# Patient Record
Sex: Female | Born: 1985 | Race: Black or African American | Hispanic: No | Marital: Married | Smoking: Former smoker
Health system: Southern US, Community
[De-identification: ages and names within clinical notes are randomized; demographics above are authoritative.]

## PROBLEM LIST (undated history)

## (undated) ENCOUNTER — Emergency Department (HOSPITAL_COMMUNITY): Admission: EM | Payer: Self-pay | Source: Home / Self Care

## (undated) DIAGNOSIS — F909 Attention-deficit hyperactivity disorder, unspecified type: Secondary | ICD-10-CM

## (undated) DIAGNOSIS — B999 Unspecified infectious disease: Secondary | ICD-10-CM

## (undated) DIAGNOSIS — F32A Depression, unspecified: Secondary | ICD-10-CM

## (undated) DIAGNOSIS — D219 Benign neoplasm of connective and other soft tissue, unspecified: Secondary | ICD-10-CM

## (undated) DIAGNOSIS — K5080 Crohn's disease of both small and large intestine without complications: Secondary | ICD-10-CM

## (undated) DIAGNOSIS — L309 Dermatitis, unspecified: Secondary | ICD-10-CM

## (undated) DIAGNOSIS — T8859XA Other complications of anesthesia, initial encounter: Secondary | ICD-10-CM

## (undated) DIAGNOSIS — D649 Anemia, unspecified: Secondary | ICD-10-CM

## (undated) DIAGNOSIS — T4145XA Adverse effect of unspecified anesthetic, initial encounter: Secondary | ICD-10-CM

## (undated) DIAGNOSIS — H269 Unspecified cataract: Secondary | ICD-10-CM

## (undated) DIAGNOSIS — K429 Umbilical hernia without obstruction or gangrene: Secondary | ICD-10-CM

## (undated) DIAGNOSIS — N83209 Unspecified ovarian cyst, unspecified side: Secondary | ICD-10-CM

## (undated) DIAGNOSIS — F419 Anxiety disorder, unspecified: Secondary | ICD-10-CM

## (undated) HISTORY — DX: Attention-deficit hyperactivity disorder, unspecified type: F90.9

## (undated) HISTORY — DX: Umbilical hernia without obstruction or gangrene: K42.9

## (undated) HISTORY — PX: ESOPHAGOGASTRODUODENOSCOPY: SHX1529

## (undated) HISTORY — DX: Depression, unspecified: F32.A

## (undated) HISTORY — DX: Unspecified cataract: H26.9

## (undated) HISTORY — PX: COLONOSCOPY: SHX174

## (undated) HISTORY — PX: OTHER SURGICAL HISTORY: SHX169

## (undated) HISTORY — DX: Crohn's disease of both small and large intestine without complications: K50.80

## (undated) HISTORY — DX: Benign neoplasm of connective and other soft tissue, unspecified: D21.9

---
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
## 2007-04-14 ENCOUNTER — Emergency Department (HOSPITAL_COMMUNITY): Admission: EM | Admit: 2007-04-14 | Discharge: 2007-04-14 | Payer: Self-pay | Admitting: Emergency Medicine

## 2007-08-26 ENCOUNTER — Emergency Department (HOSPITAL_COMMUNITY): Admission: EM | Admit: 2007-08-26 | Discharge: 2007-08-27 | Payer: Self-pay | Admitting: Emergency Medicine

## 2011-01-24 LAB — RAPID STREP SCREEN (MED CTR MEBANE ONLY): Streptococcus, Group A Screen (Direct): NEGATIVE

## 2011-01-24 LAB — STREP A DNA PROBE: Group A Strep Probe: NEGATIVE

## 2016-06-19 DIAGNOSIS — K5080 Crohn's disease of both small and large intestine without complications: Secondary | ICD-10-CM | POA: Diagnosis present

## 2016-06-19 HISTORY — DX: Crohn's disease of both small and large intestine without complications: K50.80

## 2017-06-25 ENCOUNTER — Encounter (HOSPITAL_COMMUNITY): Payer: Self-pay

## 2017-06-25 ENCOUNTER — Other Ambulatory Visit: Payer: Self-pay

## 2017-06-25 DIAGNOSIS — Z6837 Body mass index (BMI) 37.0-37.9, adult: Secondary | ICD-10-CM | POA: Diagnosis not present

## 2017-06-25 DIAGNOSIS — E440 Moderate protein-calorie malnutrition: Secondary | ICD-10-CM | POA: Insufficient documentation

## 2017-06-25 DIAGNOSIS — D473 Essential (hemorrhagic) thrombocythemia: Secondary | ICD-10-CM | POA: Diagnosis not present

## 2017-06-25 DIAGNOSIS — Z87891 Personal history of nicotine dependence: Secondary | ICD-10-CM | POA: Diagnosis not present

## 2017-06-25 DIAGNOSIS — F419 Anxiety disorder, unspecified: Secondary | ICD-10-CM | POA: Diagnosis not present

## 2017-06-25 DIAGNOSIS — K50811 Crohn's disease of both small and large intestine with rectal bleeding: Secondary | ICD-10-CM | POA: Diagnosis present

## 2017-06-25 DIAGNOSIS — K5010 Crohn's disease of large intestine without complications: Secondary | ICD-10-CM | POA: Diagnosis not present

## 2017-06-25 DIAGNOSIS — D649 Anemia, unspecified: Secondary | ICD-10-CM | POA: Insufficient documentation

## 2017-06-25 DIAGNOSIS — Z79899 Other long term (current) drug therapy: Secondary | ICD-10-CM | POA: Insufficient documentation

## 2017-06-25 LAB — CBC
HEMATOCRIT: 36 % (ref 36.0–46.0)
Hemoglobin: 11.8 g/dL — ABNORMAL LOW (ref 12.0–15.0)
MCH: 27.3 pg (ref 26.0–34.0)
MCHC: 32.8 g/dL (ref 30.0–36.0)
MCV: 83.1 fL (ref 78.0–100.0)
PLATELETS: 582 10*3/uL — AB (ref 150–400)
RBC: 4.33 MIL/uL (ref 3.87–5.11)
RDW: 16.4 % — ABNORMAL HIGH (ref 11.5–15.5)
WBC: 8 10*3/uL (ref 4.0–10.5)

## 2017-06-25 LAB — COMPREHENSIVE METABOLIC PANEL
ALT: 10 U/L — AB (ref 14–54)
AST: 15 U/L (ref 15–41)
Albumin: 2.7 g/dL — ABNORMAL LOW (ref 3.5–5.0)
Alkaline Phosphatase: 53 U/L (ref 38–126)
Anion gap: 6 (ref 5–15)
BUN: 9 mg/dL (ref 6–20)
CHLORIDE: 107 mmol/L (ref 101–111)
CO2: 24 mmol/L (ref 22–32)
CREATININE: 0.87 mg/dL (ref 0.44–1.00)
Calcium: 8.8 mg/dL — ABNORMAL LOW (ref 8.9–10.3)
Glucose, Bld: 104 mg/dL — ABNORMAL HIGH (ref 65–99)
Potassium: 4 mmol/L (ref 3.5–5.1)
SODIUM: 137 mmol/L (ref 135–145)
Total Bilirubin: <0.1 mg/dL — ABNORMAL LOW (ref 0.3–1.2)
Total Protein: 7.2 g/dL (ref 6.5–8.1)

## 2017-06-25 NOTE — ED Triage Notes (Addendum)
Pt arrives today c/o blood in stools starting yesterday. Pt recently dx with chrones disease. Pt reports 7x watery stools today, with blood dripping from rectum. Pt reports upper/mid epigastric abd pain consistent with previous chrones flare ups.  Denies chest pain, but does report some shortness of breath today.

## 2017-06-26 ENCOUNTER — Other Ambulatory Visit: Payer: Self-pay

## 2017-06-26 ENCOUNTER — Observation Stay (HOSPITAL_COMMUNITY)
Admission: EM | Admit: 2017-06-26 | Discharge: 2017-06-28 | Disposition: A | Discharge status: Home / Self Care | Payer: Managed Care, Other (non HMO) | Attending: Internal Medicine | Admitting: Internal Medicine

## 2017-06-26 ENCOUNTER — Encounter (HOSPITAL_COMMUNITY): Payer: Self-pay

## 2017-06-26 ENCOUNTER — Emergency Department (HOSPITAL_COMMUNITY): Payer: Managed Care, Other (non HMO)

## 2017-06-26 DIAGNOSIS — E440 Moderate protein-calorie malnutrition: Secondary | ICD-10-CM

## 2017-06-26 DIAGNOSIS — D696 Thrombocytopenia, unspecified: Secondary | ICD-10-CM

## 2017-06-26 DIAGNOSIS — K50811 Crohn's disease of both small and large intestine with rectal bleeding: Secondary | ICD-10-CM | POA: Diagnosis not present

## 2017-06-26 DIAGNOSIS — K5080 Crohn's disease of both small and large intestine without complications: Secondary | ICD-10-CM | POA: Diagnosis present

## 2017-06-26 DIAGNOSIS — R197 Diarrhea, unspecified: Secondary | ICD-10-CM | POA: Diagnosis not present

## 2017-06-26 DIAGNOSIS — A0472 Enterocolitis due to Clostridium difficile, not specified as recurrent: Secondary | ICD-10-CM | POA: Diagnosis not present

## 2017-06-26 DIAGNOSIS — R1084 Generalized abdominal pain: Secondary | ICD-10-CM | POA: Diagnosis not present

## 2017-06-26 DIAGNOSIS — D649 Anemia, unspecified: Secondary | ICD-10-CM | POA: Diagnosis present

## 2017-06-26 DIAGNOSIS — K5090 Crohn's disease, unspecified, without complications: Secondary | ICD-10-CM | POA: Diagnosis not present

## 2017-06-26 LAB — C DIFFICILE QUICK SCREEN W PCR REFLEX
C DIFFICILE (CDIFF) TOXIN: NEGATIVE
C DIFFICLE (CDIFF) ANTIGEN: POSITIVE — AB

## 2017-06-26 LAB — GASTROINTESTINAL PANEL BY PCR, STOOL (REPLACES STOOL CULTURE)

## 2017-06-26 LAB — URINALYSIS, ROUTINE W REFLEX MICROSCOPIC
Bilirubin Urine: NEGATIVE
GLUCOSE, UA: NEGATIVE mg/dL
Hgb urine dipstick: NEGATIVE
Ketones, ur: 5 mg/dL — AB
LEUKOCYTES UA: NEGATIVE
Nitrite: NEGATIVE
Protein, ur: NEGATIVE mg/dL
Specific Gravity, Urine: 1.025 (ref 1.005–1.030)
pH: 5 (ref 5.0–8.0)

## 2017-06-26 LAB — CBC
HCT: 36.4 % (ref 36.0–46.0)
HCT: 35.5 % — ABNORMAL LOW (ref 36.0–46.0)
Hemoglobin: 11.9 g/dL — ABNORMAL LOW (ref 12.0–15.0)
Hemoglobin: 11.5 g/dL — ABNORMAL LOW (ref 12.0–15.0)
MCH: 27.4 pg (ref 26.0–34.0)
MCH: 27 pg (ref 26.0–34.0)
MCHC: 32.7 g/dL (ref 30.0–36.0)
MCHC: 32.4 g/dL (ref 30.0–36.0)
MCV: 83.7 fL (ref 78.0–100.0)
MCV: 83.3 fL (ref 78.0–100.0)
PLATELETS: 569 10*3/uL — AB (ref 150–400)
Platelets: 496 10*3/uL — ABNORMAL HIGH (ref 150–400)
RBC: 4.35 MIL/uL (ref 3.87–5.11)
RBC: 4.26 MIL/uL (ref 3.87–5.11)
RDW: 16.5 % — AB (ref 11.5–15.5)
RDW: 16.5 % — ABNORMAL HIGH (ref 11.5–15.5)
WBC: 7.8 10*3/uL (ref 4.0–10.5)
WBC: 7.8 10*3/uL (ref 4.0–10.5)

## 2017-06-26 LAB — TYPE AND SCREEN
ABO/RH(D): B NEG
Antibody Screen: NEGATIVE

## 2017-06-26 LAB — PREGNANCY, URINE: PREG TEST UR: NEGATIVE

## 2017-06-26 LAB — C-REACTIVE PROTEIN: CRP: 11.7 mg/dL — ABNORMAL HIGH (ref <1.0)

## 2017-06-26 LAB — HIV ANTIBODY (ROUTINE TESTING W REFLEX): HIV SCREEN 4TH GENERATION: NONREACTIVE

## 2017-06-26 LAB — CLOSTRIDIUM DIFFICILE BY PCR, REFLEXED: CDIFFPCR: POSITIVE — AB

## 2017-06-26 MED ORDER — FENTANYL CITRATE (PF) 100 MCG/2ML IJ SOLN
25.0000 ug | INTRAMUSCULAR | Status: DC | PRN
  Administered 2017-06-26 – 2017-06-28 (×4): 25 ug via INTRAVENOUS
  Filled 2017-06-26 (×4): qty 2

## 2017-06-26 MED ORDER — LIDOCAINE HCL 2 % EX GEL
1.0000 "application " | CUTANEOUS | Status: DC | PRN
  Administered 2017-06-26: 1 via TOPICAL
  Filled 2017-06-26 (×2): qty 5

## 2017-06-26 MED ORDER — IOPAMIDOL (ISOVUE-300) INJECTION 61%
30.0000 mL | Freq: Once | INTRAVENOUS | Status: AC | PRN
  Administered 2017-06-26: 30 mL via ORAL

## 2017-06-26 MED ORDER — IOPAMIDOL (ISOVUE-300) INJECTION 61%
100.0000 mL | Freq: Once | INTRAVENOUS | Status: AC | PRN
  Administered 2017-06-26: 80 mL via INTRAVENOUS

## 2017-06-26 MED ORDER — VANCOMYCIN 50 MG/ML ORAL SOLUTION
125.0000 mg | Freq: Four times a day (QID) | ORAL | Status: DC
  Administered 2017-06-26 – 2017-06-28 (×6): 125 mg via ORAL
  Filled 2017-06-26 (×7): qty 2.5

## 2017-06-26 MED ORDER — BOOST / RESOURCE BREEZE PO LIQD CUSTOM
1.0000 | Freq: Two times a day (BID) | ORAL | Status: DC
  Administered 2017-06-26 – 2017-06-27 (×2): 1 via ORAL

## 2017-06-26 MED ORDER — SODIUM CHLORIDE 0.9 % IJ SOLN
INTRAMUSCULAR | Status: AC
  Filled 2017-06-26: qty 50

## 2017-06-26 MED ORDER — FENTANYL CITRATE (PF) 100 MCG/2ML IJ SOLN
50.0000 ug | Freq: Once | INTRAMUSCULAR | Status: AC
  Administered 2017-06-26: 50 ug via INTRAVENOUS
  Filled 2017-06-26: qty 2

## 2017-06-26 MED ORDER — ONDANSETRON HCL 4 MG/2ML IJ SOLN
4.0000 mg | Freq: Once | INTRAMUSCULAR | Status: AC
  Administered 2017-06-26: 4 mg via INTRAVENOUS
  Filled 2017-06-26: qty 2

## 2017-06-26 MED ORDER — FERROUS SULFATE 325 (65 FE) MG PO TABS
325.0000 mg | ORAL_TABLET | Freq: Every day | ORAL | Status: DC
  Administered 2017-06-26 – 2017-06-28 (×3): 325 mg via ORAL
  Filled 2017-06-26 (×3): qty 1

## 2017-06-26 MED ORDER — IOPAMIDOL (ISOVUE-300) INJECTION 61%
INTRAVENOUS | Status: AC
  Administered 2017-06-26: 30 mL via ORAL
  Filled 2017-06-26: qty 100

## 2017-06-26 MED ORDER — SODIUM CHLORIDE 0.9 % IV SOLN
INTRAVENOUS | Status: AC
  Administered 2017-06-26 (×2): via INTRAVENOUS

## 2017-06-26 MED ORDER — VITAMIN B-12 1000 MCG PO TABS
1000.0000 ug | ORAL_TABLET | Freq: Every day | ORAL | Status: DC
  Administered 2017-06-26 – 2017-06-28 (×3): 1000 ug via ORAL
  Filled 2017-06-26 (×3): qty 1

## 2017-06-26 MED ORDER — IOPAMIDOL (ISOVUE-300) INJECTION 61%
INTRAVENOUS | Status: AC
  Filled 2017-06-26: qty 30

## 2017-06-26 MED ORDER — ADULT MULTIVITAMIN W/MINERALS CH
1.0000 | ORAL_TABLET | Freq: Every day | ORAL | Status: DC
  Administered 2017-06-26 – 2017-06-28 (×3): 1 via ORAL
  Filled 2017-06-26 (×3): qty 1

## 2017-06-26 MED ORDER — SODIUM CHLORIDE 0.9 % IV BOLUS (SEPSIS)
500.0000 mL | Freq: Once | INTRAVENOUS | Status: AC
  Administered 2017-06-26: 500 mL via INTRAVENOUS

## 2017-06-26 MED ORDER — ACETAMINOPHEN 650 MG RE SUPP: 650.0000 mg | Freq: Four times a day (QID) | RECTAL | Status: DC | PRN

## 2017-06-26 MED ORDER — ACETAMINOPHEN 325 MG PO TABS: 650.0000 mg | ORAL_TABLET | Freq: Four times a day (QID) | ORAL | Status: DC | PRN

## 2017-06-26 MED ORDER — METHYLPREDNISOLONE SODIUM SUCC 40 MG IJ SOLR
15.0000 mg | Freq: Three times a day (TID) | INTRAMUSCULAR | Status: DC
  Administered 2017-06-26 – 2017-06-27 (×5): 15.2 mg via INTRAVENOUS
  Filled 2017-06-26 (×5): qty 1

## 2017-06-26 MED ORDER — ONDANSETRON HCL 4 MG/2ML IJ SOLN
4.0000 mg | Freq: Four times a day (QID) | INTRAMUSCULAR | Status: DC | PRN
  Administered 2017-06-26 (×2): 4 mg via INTRAVENOUS
  Filled 2017-06-26 (×2): qty 2

## 2017-06-26 MED ORDER — ONDANSETRON HCL 4 MG PO TABS: 4.0000 mg | ORAL_TABLET | Freq: Four times a day (QID) | ORAL | Status: DC | PRN

## 2017-06-26 NOTE — Progress Notes (Signed)
Patient C. Diff results are in. Jeannette Corpus, NP made aware.

## 2017-06-26 NOTE — ED Provider Notes (Signed)
[facility] DEPT Provider Note: Georgena Spurling, MD, FACEP  CSN: [id number] MRN: [id number] ARRIVAL: 06/25/17 at 2125 ROOM: [place]  Rectal Bleeding   HISTORY OF PRESENT ILLNESS  06/26/17 12:11 AM Whitney Lowe is a 32 y.o. female who was diagnosed with Crohn's disease about a year ago.  She was previously on prednisone but was switched to Humira about 4 months ago.  Since being on the Humira her symptoms have worsened.  She does not believe the Humira is effective.  She has worsened over the last week with epigastric pain, which she rates as a 7 out of 10 and worse with movement or palpation, weight loss, watery diarrhea, and bright red blood per rectum.  The blood is actually been dripping from her rectum at times.  There is also associated rectal pain.  She denies nausea or vomiting.  She has generalized weakness.  She is followed in [place] and has not seen her physician there in over a month.    History reviewed. No pertinent past medical history.  History reviewed. No pertinent surgical history.  History reviewed. No pertinent family history.  Social History   Tobacco Use  . Smoking status: Former Research scientist (life sciences)  . Smokeless tobacco: Never Used  Substance Use Topics  . Alcohol use: No    Frequency: Never  . Drug use: No    Prior to Admission medications   Medication Sig Start Date End Date Taking? Authorizing Provider  Adalimumab (HUMIRA) 40 MG/0.4ML PSKT Inject 40 mg into the skin every 14 (fourteen) days.   Yes [provider]  cholecalciferol (VITAMIN D) 1000 units tablet Take 1,000 Units by mouth daily.   Yes [provider]  Ferrous Sulfate (IRON) 28 MG TABS Take 1 tablet by mouth daily.    Yes [provider]  vitamin B-12 (CYANOCOBALAMIN) 1000 MCG tablet Take 1,000 mcg by mouth daily.   Yes [provider]    Allergies Patient has no known allergies.   REVIEW OF SYSTEMS  Negative except as noted here or in  the History of Present Illness.   PHYSICAL EXAMINATION  Initial Vital Signs Blood pressure 109/74, pulse (!) 142, temperature 98.7 F (37.1 C), temperature source Oral, resp. rate 16, height 4' 10"  (1.473 m), weight 40.4 kg (89 lb), last menstrual period 06/09/2017, SpO2 99 %.  Examination General: Well-developed, thin female in no acute distress; appearance consistent with age of record HENT: normocephalic; atraumatic Eyes: pupils equal, round and reactive to light; extraocular muscles intact Neck: supple Heart: regular rate and rhythm Lungs: clear to auscultation bilaterally Abdomen: soft; nondistended; epigastric tenderness; no masses or hepatosplenomegaly; bowel sounds present Rectal: normal appearance, pain on separation of buttocks; refused digital examination Extremities: No deformity; full range of motion; pulses normal Neurologic: Awake, alert and oriented; motor function intact in all extremities and symmetric; no facial droop Skin: Warm and dry Psychiatric: Normal mood and affect   RESULTS  Summary of this visit's results, reviewed by myself:  EKG Interpretation:  Date & Time: 06/25/2017 10:04 PM  Rate: 108  Rhythm: sinus tachycardia  QRS Axis: normal  Intervals: normal  ST/T Wave abnormalities: normal  Conduction Disutrbances:none  Narrative Interpretation:   Old EKG Reviewed: none available   Laboratory Studies: Results for orders placed or performed during the hospital encounter of 06/26/17 (from the past 24 hour(s))  Comprehensive metabolic panel     Status: Abnormal   Collection Time: 06/25/17 10:24 PM  Result Value Ref Range  Sodium 137 135 - 145 mmol/L   Potassium 4.0 3.5 - 5.1 mmol/L   Chloride 107 101 - 111 mmol/L   CO2 24 22 - 32 mmol/L   Glucose, Bld 104 (H) 65 - 99 mg/dL   BUN 9 6 - 20 mg/dL   Creatinine, Ser 0.87 0.44 - 1.00 mg/dL   Calcium 8.8 (L) 8.9 - 10.3 mg/dL   Total Protein 7.2 6.5 - 8.1 g/dL   Albumin 2.7 (L) 3.5 - 5.0 g/dL   AST  15 15 - 41 U/L   ALT 10 (L) 14 - 54 U/L   Alkaline Phosphatase 53 38 - 126 U/L   Total Bilirubin <0.1 (L) 0.3 - 1.2 mg/dL   GFR calc non Af Amer >60 >60 mL/min   GFR calc Af Amer >60 >60 mL/min   Anion gap 6 5 - 15  CBC     Status: Abnormal   Collection Time: 06/25/17 10:24 PM  Result Value Ref Range   WBC 8.0 4.0 - 10.5 K/uL   RBC 4.33 3.87 - 5.11 MIL/uL   Hemoglobin 11.8 (L) 12.0 - 15.0 g/dL   HCT 36.0 36.0 - 46.0 %   MCV 83.1 78.0 - 100.0 fL   MCH 27.3 26.0 - 34.0 pg   MCHC 32.8 30.0 - 36.0 g/dL   RDW 16.4 (H) 11.5 - 15.5 %   Platelets 582 (H) 150 - 400 K/uL  Type and screen [place]     Status: None   Collection Time: 06/25/17 10:24 PM  Result Value Ref Range   ABO/RH(D) B NEG    Antibody Screen NEG    Sample Expiration      06/28/2017 Performed at [facility], [place] [address] [place], [place] [address]   Pregnancy, urine     Status: None   Collection Time: 06/26/17 12:54 AM  Result Value Ref Range   Preg Test, Ur NEGATIVE NEGATIVE  Urinalysis, Routine w reflex microscopic     Status: Abnormal   Collection Time: 06/26/17 12:54 AM  Result Value Ref Range   Color, Urine YELLOW YELLOW   APPearance CLEAR CLEAR   Specific Gravity, Urine 1.025 1.005 - 1.030   pH 5.0 5.0 - 8.0   Glucose, UA NEGATIVE NEGATIVE mg/dL   Hgb urine dipstick NEGATIVE NEGATIVE   Bilirubin Urine NEGATIVE NEGATIVE   Ketones, ur 5 (A) NEGATIVE mg/dL   Protein, ur NEGATIVE NEGATIVE mg/dL   Nitrite NEGATIVE NEGATIVE   Leukocytes, UA NEGATIVE NEGATIVE   Imaging Studies: Ct Abdomen Pelvis W Contrast  Result Date: 06/26/2017 CLINICAL DATA:  Bloody stool EXAM: CT ABDOMEN AND PELVIS WITH CONTRAST TECHNIQUE: Multidetector CT imaging of the abdomen and pelvis was performed using the standard protocol following bolus administration of intravenous contrast. CONTRAST:  85m ISOVUE-300 IOPAMIDOL (ISOVUE-300) INJECTION 61% COMPARISON:  None. FINDINGS: Lower chest: No  acute abnormality. Hepatobiliary: Probable focal fat infiltration near the falciform ligament. No calcified gallstones or biliary dilatation Pancreas: Unremarkable. No pancreatic ductal dilatation or surrounding inflammatory changes. Spleen: Normal in size without focal abnormality. Adrenals/Urinary Tract: Adrenal glands are unremarkable. Kidneys are normal, without renal calculi, focal lesion, or hydronephrosis. Bladder is unremarkable. Stomach/Bowel: Stomach is nonenlarged. No dilated small bowel. Wall thickening and mucosal enhancement involving long segment of distal/terminal ileum. Diffuse wall thickening of the colon with mucosal enhancement. Vascular/Lymphatic: Nonaneurysmal aorta. No significantly enlarged lymph nodes Reproductive: Uterus contains multiple enhancing masses, for example right fundal mass measures 3 cm, right posterior myometrial exophytic mass  measures 3 cm. No adnexal mass. Other: Negative for free air or free fluid. Musculoskeletal: No acute or significant osseous findings. IMPRESSION: 1. Long segment wall thickening and mucosal enhancement involving the distal/terminal ileum with diffuse wall thickening and mucosal enhancement of the colon, consistent with ileitis and colitis, in keeping with the history of inflammatory bowel disease. No obstruction identified. 2. Fibroid uterus Electronically Signed   By: Donavan Foil M.D.   On: 06/26/2017 03:43    ED COURSE  Nursing notes and initial vitals signs, including pulse oximetry, reviewed.  Vitals:   06/26/17 0130 06/26/17 0200 06/26/17 0258 06/26/17 0400  BP: 101/67 101/71 101/70   Pulse: (!) 110 (!) 105 (!) 108 (!) 102  Resp: 18  18 (!) 21  Temp:      TempSrc:      SpO2: 100% 98% 99% 100%  Weight:      Height:       Dr. Hal Hope to admit to Hospitalist Service for Crohn's exacerbation.  PROCEDURES    ED DIAGNOSES     ICD-10-CM   1. Crohn's disease of both small and large intestine with rectal bleeding ([place]) K50.811         Christle Nolting, Jenny Reichmann, MD 06/26/17 [phone]

## 2017-06-26 NOTE — Consult Note (Signed)
Referring Provider: [facility]  Primary Care Physician:  Patient, No Pcp Per Primary Gastroenterologist:  In [place],  Reason for Consultation:   Crohn's , hematochezia.    ASSESSMENT AND PLAN:    32 yo female with Crohn's disease diagnosed March 2018.  Crohn's colitis by colonoscopy with biopsies but CT scan this admission c/w Crohn's ileocolitis.  Started Humira several months but never able to get off prednisone without recurrent symptoms. Totally off steriods since Jan and having 4-5 loose BMs a day, postprandial epigastric pain, weight loss and now with recent increase in rectal bleeding. -CRP -Stool path panel negative, C-diff pending.  -Adalimumab antibodies and drug level ordered -Solumedrol 15 mg Q hours.      HPI: Whitney Lowe is a 32 y.o. female with Crohn's colitis disease diagnosed a year ago in [place] , [place] by Dr. Modesto Charon. Her mother has Crohn's disease as well. Patient's presenting symptoms were diarrhea, abdominal pain and erythematous nodosum. Endoscopic workup showed Crohn's involvement of whole colon. Biopsies from rectal to cecum c/w with chronic inflammation/ chronic active colitis and focal non-necrotizing granulomas. Duodenal bx negative. Initially treated with prednisone while awaiting insurance approval of Humira. On prednisone her pain resolved and BM normalized. She got down to about 20 mg a day but became dependant on it as Humira a lone didn't work well enough. In January her GI doc stopped the prednisone. Since then she has averaged 5-6 loose stools a day, sometimes with blood and she has continued to have postprandial epigastric pain relieved with defecation. Over the last several days patient has noticed more blood in her stools and she is losing excessive weight. She has been on Humira 40 mg SQ Q 2 weeks. She was supposed to get stool samples and labs for humira antibodies but didn't make it back to [place]. Patient lives here now, she would like  for [place] to assume her GI care.       Past Medical History:  Diagnosis Date  . Crohn's colitis ([place])     History reviewed. No pertinent surgical history.  Prior to Admission medications   Medication Sig Start Date End Date Taking? Authorizing Provider  Adalimumab (HUMIRA) 40 MG/0.4ML PSKT Inject 40 mg into the skin every 14 (fourteen) days.   Yes [provider]  cholecalciferol (VITAMIN D) 1000 units tablet Take 1,000 Units by mouth daily.   Yes [provider]  Ferrous Sulfate (IRON) 28 MG TABS Take 1 tablet by mouth daily.    Yes [provider]  vitamin B-12 (CYANOCOBALAMIN) 1000 MCG tablet Take 1,000 mcg by mouth daily.   Yes [provider]    Current Facility-Administered Medications  Medication Dose Route Frequency Provider Last Rate Last Dose  . 0.9 %  sodium chloride infusion   Intravenous Continuous Rise Patience, MD 75 mL/hr at 06/26/17 [phone]    . acetaminophen (TYLENOL) tablet 650 mg  650 mg Oral Q6H PRN Rise Patience, MD       Or  . acetaminophen (TYLENOL) suppository 650 mg  650 mg Rectal Q6H PRN Rise Patience, MD      . feeding supplement (BOOST / RESOURCE BREEZE) liquid 1 Container  1 Container Oral BID BM Florencia Reasons, MD      . fentaNYL (SUBLIMAZE) injection 25 mcg  25 mcg Intravenous Q2H PRN Rise Patience, MD   25 mcg at 06/26/17 1201  . ferrous sulfate tablet 325 mg  325 mg Oral Daily Rise Patience,  MD   325 mg at 06/26/17 0900  . lidocaine (XYLOCAINE) 2 % jelly 1 application  1 application Topical PRN Molpus, John, MD   1 application at 79/89/21 0050  . multivitamin with minerals tablet 1 tablet  1 tablet Oral Daily Florencia Reasons, MD      . ondansetron [facility]) tablet 4 mg  4 mg Oral Q6H PRN Rise Patience, MD       Or  . ondansetron [facility]) injection 4 mg  4 mg Intravenous Q6H PRN Rise Patience, MD   4 mg at 06/26/17 1201  . vitamin B-12 (CYANOCOBALAMIN) tablet 1,000 mcg  1,000 mcg Oral  Daily Rise Patience, MD   1,000 mcg at 06/26/17 0900    Allergies as of 06/25/2017  . (Not on File)    Family History  Problem Relation Age of Onset  . Crohn's disease Mother     Social History   Socioeconomic History  . Marital status: Single    Spouse name: Not on file  . Number of children: Not on file  . Years of education: Not on file  . Highest education level: Not on file  Social Needs  . Financial resource strain: Not on file  . Food insecurity - worry: Not on file  . Food insecurity - inability: Not on file  . Transportation needs - medical: Not on file  . Transportation needs - non-medical: Not on file  Occupational History  . Not on file  Tobacco Use  . Smoking status: Former Research scientist (life sciences)  . Smokeless tobacco: Never Used  Substance and Sexual Activity  . Alcohol use: No    Frequency: Never  . Drug use: No  . Sexual activity: Not on file  Other Topics Concern  . Not on file  Social History Narrative  . Not on file    Review of Systems: All systems reviewed and negative except where noted in HPI.  Physical Exam: Vital signs in last 24 hours: Temp:  [98.1 F (36.7 C)-98.7 F (37.1 C)] 98.1 F (36.7 C) (03/08 1323) Pulse Rate:  [102-142] 124 (03/08 1323) Resp:  [12-21] 18 (03/08 1323) BP: (101-119)/(65-81) 102/65 (03/08 1323) SpO2:  [95 %-100 %] 100 % (03/08 1323) Weight:  [89 lb (40.4 kg)-92 lb 9.5 oz (42 kg)] 92 lb 9.5 oz (42 kg) (03/08 0629) Last BM Date: 06/26/17 General:   Alert, thin black female in NAD Psych:  Pleasant, cooperative. Normal mood and affect. Eyes:  Pupils equal, sclera clear, no icterus.   Conjunctiva pink. Ears:  Normal auditory acuity. Nose:  No deformity, discharge,  or lesions. Neck:  Supple; no masses Lungs:  Clear throughout to auscultation.   No wheezes, crackles, or rhonchi.  Heart:  Regular rate and rhythm; no murmurs, no edema Abdomen:  Soft, non-distended, moderate tenderness in RLQ. BS active, no palp mass      Rectal:  Deferred  Msk:  Symmetrical without gross deformities. . Neurologic:  Alert and  oriented x4;  grossly normal neurologically. Skin:  Intact without significant lesions or rashes..   Intake/Output from previous day: 03/07 0701 - 03/08 0700 In: 782.5 [P.O.:240; I.V.:42.5; IV Piggyback:500] Out: -  Intake/Output this shift: No intake/output data recorded.  Lab Results: Recent Labs    06/25/17 2224 06/26/17 0647 06/26/17 1120  WBC 8.0 7.8 7.8  HGB 11.8* 11.9* 11.5*  HCT 36.0 36.4 35.5*  PLT 582* 569* 496*   BMET Recent Labs    06/25/17 2224  NA 137  K  4.0  CL 107  CO2 24  GLUCOSE 104*  BUN 9  CREATININE 0.87  CALCIUM 8.8*   LFT Recent Labs    06/25/17 2224  PROT 7.2  ALBUMIN 2.7*  AST 15  ALT 10*  ALKPHOS 19  BILITOT <0.1*    Studies/Results: Ct Abdomen Pelvis W Contrast  Result Date: 06/26/2017 CLINICAL DATA:  Bloody stool EXAM: CT ABDOMEN AND PELVIS WITH CONTRAST TECHNIQUE: Multidetector CT imaging of the abdomen and pelvis was performed using the standard protocol following bolus administration of intravenous contrast. CONTRAST:  65m ISOVUE-300 IOPAMIDOL (ISOVUE-300) INJECTION 61% COMPARISON:  None. FINDINGS: Lower chest: No acute abnormality. Hepatobiliary: Probable focal fat infiltration near the falciform ligament. No calcified gallstones or biliary dilatation Pancreas: Unremarkable. No pancreatic ductal dilatation or surrounding inflammatory changes. Spleen: Normal in size without focal abnormality. Adrenals/Urinary Tract: Adrenal glands are unremarkable. Kidneys are normal, without renal calculi, focal lesion, or hydronephrosis. Bladder is unremarkable. Stomach/Bowel: Stomach is nonenlarged. No dilated small bowel. Wall thickening and mucosal enhancement involving long segment of distal/terminal ileum. Diffuse wall thickening of the colon with mucosal enhancement. Vascular/Lymphatic: Nonaneurysmal aorta. No significantly enlarged lymph nodes  Reproductive: Uterus contains multiple enhancing masses, for example right fundal mass measures 3 cm, right posterior myometrial exophytic mass measures 3 cm. No adnexal mass. Other: Negative for free air or free fluid. Musculoskeletal: No acute or significant osseous findings. IMPRESSION: 1. Long segment wall thickening and mucosal enhancement involving the distal/terminal ileum with diffuse wall thickening and mucosal enhancement of the colon, consistent with ileitis and colitis, in keeping with the history of inflammatory bowel disease. No obstruction identified. 2. Fibroid uterus Electronically Signed   By: KDonavan FoilM.D.   On: 06/26/2017 03:43     PTye Savoy NP-C @  06/26/2017, 1:55 PM  Pager number [phone]

## 2017-06-26 NOTE — ED Notes (Signed)
ED TO INPATIENT HANDOFF REPORT  Name/Age/Gender Whitney Lowe 32 y.o. female  Code Status    Code Status Orders  (From admission, onward)        Start     Ordered   06/26/17 0529  Full code  Continuous     06/26/17 0530    Code Status History    Date Active Date Inactive Code Status Order ID Comments User Context   This patient has a current code status but no historical code status.      Home/SNF/Other Home  Chief Complaint crohn's flare up, abdominal pains, blood in stool  Level of Care/Admitting Diagnosis ED Disposition    ED Disposition Condition [facility] Area: [place] [[id number]]  Level of Care: Telemetry [5]  Admit to tele based on following criteria: Monitor for Ischemic changes  Diagnosis: Crohn's colitis [employer]) [[id number]]  Admitting Physician: Rise Patience [phone]  Attending Physician: Rise Patience [[id number]]  PT Class (Do Not Modify): Observation [104]  PT Acc Code (Do Not Modify): Observation [10022]       Medical History Past Medical History:  Diagnosis Date  . Crohn's colitis ([place])     Allergies No Known Allergies  IV Location/Drains/Wounds Patient Lines/Drains/Airways Status   Active Line/Drains/Airways    Name:   Placement date:   Placement time:   Site:   Days:   Peripheral IV 06/26/17 Right Wrist   06/26/17    0131    Wrist   less than 1          Labs/Imaging Results for orders placed or performed during the hospital encounter of 06/26/17 (from the past 48 hour(s))  Comprehensive metabolic panel     Status: Abnormal   Collection Time: 06/25/17 10:24 PM  Result Value Ref Range   Sodium 137 135 - 145 mmol/L   Potassium 4.0 3.5 - 5.1 mmol/L   Chloride 107 101 - 111 mmol/L   CO2 24 22 - 32 mmol/L   Glucose, Bld 104 (H) 65 - 99 mg/dL   BUN 9 6 - 20 mg/dL   Creatinine, Ser 0.87 0.44 - 1.00 mg/dL   Calcium 8.8 (L) 8.9 - 10.3 mg/dL   Total Protein 7.2 6.5 - 8.1 g/dL   Albumin 2.7 (L)  3.5 - 5.0 g/dL   AST 15 15 - 41 U/L   ALT 10 (L) 14 - 54 U/L   Alkaline Phosphatase 53 38 - 126 U/L   Total Bilirubin <0.1 (L) 0.3 - 1.2 mg/dL   GFR calc non Af Amer >60 >60 mL/min   GFR calc Af Amer >60 >60 mL/min    Comment: (NOTE) The eGFR has been calculated using the CKD EPI equation. This calculation has not been validated in all clinical situations. eGFR's persistently <60 mL/min signify possible Chronic Kidney Disease.    Anion gap 6 5 - 15    Comment: Performed at [facility], [place] [address], [place], [place] [address]  CBC     Status: Abnormal   Collection Time: 06/25/17 10:24 PM  Result Value Ref Range   WBC 8.0 4.0 - 10.5 K/uL   RBC 4.33 3.87 - 5.11 MIL/uL   Hemoglobin 11.8 (L) 12.0 - 15.0 g/dL   HCT 36.0 36.0 - 46.0 %   MCV 83.1 78.0 - 100.0 fL   MCH 27.3 26.0 - 34.0 pg   MCHC 32.8 30.0 - 36.0 g/dL   RDW 16.4 (H) 11.5 - 15.5 %  Platelets 582 (H) 150 - 400 K/uL    Comment: Performed at [facility], [place] [address] [place], [place] [address]  Type and screen [place]     Status: None   Collection Time: 06/25/17 10:24 PM  Result Value Ref Range   ABO/RH(D) B NEG    Antibody Screen NEG    Sample Expiration      06/28/2017 Performed at [facility], [place] [address], [place], [place] [address]   Pregnancy, urine     Status: None   Collection Time: 06/26/17 12:54 AM  Result Value Ref Range   Preg Test, Ur NEGATIVE NEGATIVE    Comment:        THE SENSITIVITY OF THIS METHODOLOGY IS >20 mIU/mL. Performed at [facility], [place] [address], [place], [place] [address]   Urinalysis, Routine w reflex microscopic     Status: Abnormal   Collection Time: 06/26/17 12:54 AM  Result Value Ref Range   Color, Urine YELLOW YELLOW   APPearance CLEAR CLEAR   Specific Gravity, Urine 1.025 1.005 - 1.030   pH 5.0 5.0 - 8.0   Glucose, UA NEGATIVE NEGATIVE mg/dL   Hgb urine dipstick  NEGATIVE NEGATIVE   Bilirubin Urine NEGATIVE NEGATIVE   Ketones, ur 5 (A) NEGATIVE mg/dL   Protein, ur NEGATIVE NEGATIVE mg/dL   Nitrite NEGATIVE NEGATIVE   Leukocytes, UA NEGATIVE NEGATIVE    Comment: Performed at [place] [address], [place], [place] [address]   Ct Abdomen Pelvis W Contrast  Result Date: 06/26/2017 CLINICAL DATA:  Bloody stool EXAM: CT ABDOMEN AND PELVIS WITH CONTRAST TECHNIQUE: Multidetector CT imaging of the abdomen and pelvis was performed using the standard protocol following bolus administration of intravenous contrast. CONTRAST:  34m ISOVUE-300 IOPAMIDOL (ISOVUE-300) INJECTION 61% COMPARISON:  None. FINDINGS: Lower chest: No acute abnormality. Hepatobiliary: Probable focal fat infiltration near the falciform ligament. No calcified gallstones or biliary dilatation Pancreas: Unremarkable. No pancreatic ductal dilatation or surrounding inflammatory changes. Spleen: Normal in size without focal abnormality. Adrenals/Urinary Tract: Adrenal glands are unremarkable. Kidneys are normal, without renal calculi, focal lesion, or hydronephrosis. Bladder is unremarkable. Stomach/Bowel: Stomach is nonenlarged. No dilated small bowel. Wall thickening and mucosal enhancement involving long segment of distal/terminal ileum. Diffuse wall thickening of the colon with mucosal enhancement. Vascular/Lymphatic: Nonaneurysmal aorta. No significantly enlarged lymph nodes Reproductive: Uterus contains multiple enhancing masses, for example right fundal mass measures 3 cm, right posterior myometrial exophytic mass measures 3 cm. No adnexal mass. Other: Negative for free air or free fluid. Musculoskeletal: No acute or significant osseous findings. IMPRESSION: 1. Long segment wall thickening and mucosal enhancement involving the distal/terminal ileum with diffuse wall thickening and mucosal enhancement of the colon, consistent with ileitis and colitis, in keeping with the history of  inflammatory bowel disease. No obstruction identified. 2. Fibroid uterus Electronically Signed   By: KDonavan FoilM.D.   On: 06/26/2017 03:43    Pending Labs Unresulted Labs (From admission, onward)   Start     Ordered   06/27/17 0500  Comprehensive metabolic panel  Tomorrow morning,   R     06/26/17 0530   06/26/17 0529  CBC  Now then every 6 hours,   R     06/26/17 0530   06/26/17 0528  HIV antibody (Routine Testing)  Once,   R     06/26/17 0530   06/26/17 0036  Gastrointestinal Panel by PCR , Stool  (Gastrointestinal  Panel by PCR, Stool)  Once,   R     06/26/17 0035   06/25/17 2224  ABO/Rh  Once,   R     06/25/17 2224      Vitals/Pain Today's Vitals   06/26/17 0400 06/26/17 0508 06/26/17 0530 06/26/17 0600  BP:  106/69 119/81 104/66  Pulse: (!) 102 (!) 109 (!) 108 (!) 105  Resp: (!) 21 12 13 13   Temp:      TempSrc:      SpO2: 100% 95% 99% 98%  Weight:      Height:      PainSc:        Isolation Precautions No active isolations  Medications Medications  lidocaine (XYLOCAINE) 2 % jelly 1 application (1 application Topical Given 06/26/17 0050)  sodium chloride 0.9 % injection (not administered)  Iron TABS 28 mg (not administered)  vitamin B-12 (CYANOCOBALAMIN) tablet 1,000 mcg (not administered)  acetaminophen (TYLENOL) tablet 650 mg (not administered)    Or  acetaminophen (TYLENOL) suppository 650 mg (not administered)  ondansetron (ZOFRAN) tablet 4 mg (not administered)    Or  ondansetron (ZOFRAN) injection 4 mg (not administered)  0.9 %  sodium chloride infusion (not administered)  fentaNYL (SUBLIMAZE) injection 25 mcg (not administered)  sodium chloride 0.9 % bolus 500 mL (0 mLs Intravenous Stopped 06/26/17 0333)  ondansetron (ZOFRAN) injection 4 mg (4 mg Intravenous Given 06/26/17 0118)  fentaNYL (SUBLIMAZE) injection 50 mcg (50 mcg Intravenous Given 06/26/17 0118)  iopamidol (ISOVUE-300) 61 % injection 30 mL (30 mLs Oral Contrast Given 06/26/17 0118)  iopamidol  (ISOVUE-300) 61 % injection 100 mL (80 mLs Intravenous Contrast Given 06/26/17 0303)  ondansetron (ZOFRAN) injection 4 mg (4 mg Intravenous Given 06/26/17 0340)  fentaNYL (SUBLIMAZE) injection 50 mcg (50 mcg Intravenous Given 06/26/17 0508)    Mobility walks

## 2017-06-26 NOTE — Progress Notes (Signed)
Initial Nutrition Assessment  DOCUMENTATION CODES:   Non-severe (moderate) malnutrition in context of acute illness/injury  INTERVENTION:  - Will order Boost Breeze BID, each supplement provides 250 kcal and 9 grams of protein - Will order [employer] Breakfast (vanilla) with soy milk (BID). - Will order multivitamin with minerals. - Continue diet advancement as medically feasible.  - Continue to encourage PO intakes.    NUTRITION DIAGNOSIS:   Moderate Malnutrition related to acute illness(Crohn's flare) as evidenced by mild fat depletion, mild muscle depletion, moderate muscle depletion.  GOAL:   Patient will meet greater than or equal to 90% of their needs  MONITOR:   PO intake, Supplement acceptance, Diet advancement, Weight trends, Labs, I & O's  REASON FOR ASSESSMENT:   Malnutrition Screening Tool  ASSESSMENT:   32 y.o. female with history of Crohn's disease diagnosed in March 2018 being treated at [facility] who just moved to [place] and is on Humira for Crohn's disease presents with worsening abdominal pain and bloody diarrhea over the last 1 week.  Denies any vomiting.  Pain is mostly in the epigastric area.  Patient states last 2 days her diarrhea is worsened.  Patient feels slightly dizzy did not pass out.  When she eats the diarrhea gets worse.    BMI indicates normal weight. No intakes documented since admission. Diet advanced from NPO to [place] today at 5:30 AM. Pt reports that she was dx with Crohn's exactly one year ago (06/26/16) and from that time until September 2018 she was on Prednisone. During those months, she was able to eat and drink any item without issue. In September she was started on [place] and feel like she has had a continuous Crohn's flare since that time.  She reports 4-5 soft or liquidy BMs/day and reports no solid BMs since September. She does not like spicy foods. She has abdominal discomfort with dairy items. Acidic foods also cause  discomfort. At home she was drinking [employer] music breakfast (is open to vanilla flavor as strawberry not available here) mixed in very vanilla soymilk and tolerating without issues. She was also able to tolerate items such as shrimp, fried chicken, blackberries, watermelon popsicles, apple juice, hamburgers and steak, and intermittently did okay with pizza. Last PO intake PTA was a slice of pizza on 3/7 which she tolerated without issue.   She denies nausea at this time. She states that abdominal pain has been well controlled although it is starting to hurt from frequent BMs. She reports that she knows she needs to eat but is also hesitant as she does not like the taste of many of the items available/items with dairy and knowing that eating will cause her to have to go to the bathroom more frequently. She reports that BMs since admission have only been blood.   Physical assessment outlined below. She reports that while on Prednisone she gained up to 112 lbs, which she had not weighed since high school. She reports that with switching to [facility] she dropped to 98 lbs in a very short time frame and that more recently lost weight from 98 lbs. Unable to state with certainty the last time she was at that weight. She has lost 6 lbs (6% body weight) in unknown time frame.  Pt is open to trying Boost Breeze and receiving [employer].  Medications reviewed; 1000 mcg oral vitamin B12/day, 325 mg ferrous sulfate/day.  Labs reviewed. IVF: NS @ 75 mL/hr.      Diet Order:  Diet full liquid Room service appropriate? Yes; Fluid consistency: Thin  EDUCATION NEEDS:   No education needs have been identified at this time  Skin:  Skin Assessment: Reviewed RN Assessment  Last BM:  3/8  Height:   Ht Readings from Last 1 Encounters:  06/26/17 4' 10"  (1.473 m)    Weight:   Wt Readings from Last 1 Encounters:  06/26/17 92 lb 9.5 oz (42 kg)    Ideal Body Weight:  42.18  kg  BMI:  Body mass index is 19.35 kg/m.  Estimated Nutritional Needs:   Kcal:  1470-1680 (35-40 kcal/kg)  Protein:  60-70 grams   Fluid:  >/= 2 L/day     Jarome Matin, MS, RD, LDN, [facility] Inpatient Clinical Dietitian Pager # [phone] After hours/weekend pager # [phone]

## 2017-06-26 NOTE — Care Management Note (Signed)
Case Management Note  Patient Details  Name: Whitney Lowe MRN: [id number] Date of Birth: Jan 22, 1986  Subjective/Objective:  32 y/o f admitted w/Crohn's exac. From home. Recent move from out of state to [place]. Provided w/pcp listing-patient will make own pcp appt. No further CM needs.                  Action/Plan:d/c home.   Expected Discharge Date:                  Expected Discharge Plan:  Home/Self Care  In-House Referral:     Discharge planning Services  CM Consult  Post Acute Care Choice:    Choice offered to:     DME Arranged:    DME Agency:     HH Arranged:    [place] Agency:     Status of Service:  Completed, signed off  If discussed at [employer] of Stay Meetings, dates discussed:    Additional Comments:  Dessa Phi, RN 06/26/2017, 12:51 PM

## 2017-06-26 NOTE — ED Notes (Signed)
Patient given apple juice, ok per provider.

## 2017-06-26 NOTE — H&P (Signed)
History and Physical    Whitney Lowe YTK:[id number] DOB: 1986-04-04 DOA: 06/26/2017  PCP: Patient, No Pcp Per  Patient coming from: Home.  Chief Complaint: Abdominal pain and bloody diarrhea.  HPI: Whitney Lowe is a 32 y.o. female with history of Crohn's disease diagnosed in March 2018 being treated at [facility] who just moved to [place] and is on Humira for Crohn's disease presents with worsening abdominal pain and bloody diarrhea over the last 1 week.  Denies any vomiting.  Pain is mostly in the epigastric area.  Patient states last 2 days her diarrhea is worsened.  Patient feels slightly dizzy did not pass out.  When she eats the diarrhea gets worse.    Patient states initially when she was diagnosed she was on prednisone when she did well.  But for last 3 months she was only on Humira and her exacerbations have become more frequent.  ED Course: The ER patient is tachycardic with hemoglobin around 11.  Had more bowel movements which were bloody but the last 2 were nonbloody.  CT scan shows features consistent with Crohn's exacerbation.  Patient is being admitted for further management.  Review of Systems: As per HPI, rest all negative.   Past Medical History:  Diagnosis Date  . Crohn's colitis ([place])     History reviewed. No pertinent surgical history.   reports that she has quit smoking. she has never used smokeless tobacco. She reports that she does not drink alcohol or use drugs.  No Known Allergies  Family History  Problem Relation Age of Onset  . Crohn's disease Mother     Prior to Admission medications   Medication Sig Start Date End Date Taking? Authorizing Provider  Adalimumab (HUMIRA) 40 MG/0.4ML PSKT Inject 40 mg into the skin every 14 (fourteen) days.   Yes [provider]  cholecalciferol (VITAMIN D) 1000 units tablet Take 1,000 Units by mouth daily.   Yes [provider]  Ferrous Sulfate (IRON) 28 MG TABS Take 1 tablet by mouth daily.     Yes [provider]  vitamin B-12 (CYANOCOBALAMIN) 1000 MCG tablet Take 1,000 mcg by mouth daily.   Yes [provider]    Physical Exam: Vitals:   06/26/17 0200 06/26/17 0258 06/26/17 0400 06/26/17 0508  BP: 101/71 101/70  106/69  Pulse: (!) 105 (!) 108 (!) 102 (!) 109  Resp:  18 (!) 21 12  Temp:      TempSrc:      SpO2: 98% 99% 100% 95%  Weight:      Height:          Constitutional: Moderately built and nourished. Vitals:   06/26/17 0200 06/26/17 0258 06/26/17 0400 06/26/17 0508  BP: 101/71 101/70  106/69  Pulse: (!) 105 (!) 108 (!) 102 (!) 109  Resp:  18 (!) 21 12  Temp:      TempSrc:      SpO2: 98% 99% 100% 95%  Weight:      Height:       Eyes: Anicteric no pallor. ENMT: No discharge from the ears eyes nose or mouth. Neck: No mass felt.  No neck rigidity. Respiratory: No rhonchi or crepitations. Cardiovascular: S1-S2 heard tachycardic. Abdomen: Soft nontender bowel sounds present. Musculoskeletal: No edema.  No joint effusion. Skin: No rash.  Skin appears warm. Neurologic: Alert awake oriented to time place and person.  Moves all extremities. Psychiatric: Appears normal.  Normal affect.   Labs on Admission: I have personally reviewed following  labs and imaging studies  CBC: Recent Labs  Lab 06/25/17 2224  WBC 8.0  HGB 11.8*  HCT 36.0  MCV 83.1  PLT 154*   Basic Metabolic Panel: Recent Labs  Lab 06/25/17 2224  NA 137  K 4.0  CL 107  CO2 24  GLUCOSE 104*  BUN 9  CREATININE 0.87  CALCIUM 8.8*   GFR: Estimated Creatinine Clearance: 58.7 mL/min (by C-G formula based on SCr of 0.87 mg/dL). Liver Function Tests: Recent Labs  Lab 06/25/17 2224  AST 15  ALT 10*  ALKPHOS 53  BILITOT <0.1*  PROT 7.2  ALBUMIN 2.7*   No results for input(s): LIPASE, AMYLASE in the last 168 hours. No results for input(s): AMMONIA in the last 168 hours. Coagulation Profile: No results for input(s): INR, PROTIME in the last 168  hours. Cardiac Enzymes: No results for input(s): CKTOTAL, CKMB, CKMBINDEX, TROPONINI in the last 168 hours. BNP (last 3 results) No results for input(s): PROBNP in the last 8760 hours. HbA1C: No results for input(s): HGBA1C in the last 72 hours. CBG: No results for input(s): GLUCAP in the last 168 hours. Lipid Profile: No results for input(s): CHOL, HDL, LDLCALC, TRIG, CHOLHDL, LDLDIRECT in the last 72 hours. Thyroid Function Tests: No results for input(s): TSH, T4TOTAL, FREET4, T3FREE, THYROIDAB in the last 72 hours. Anemia Panel: No results for input(s): VITAMINB12, FOLATE, FERRITIN, TIBC, IRON, RETICCTPCT in the last 72 hours. Urine analysis:    Component Value Date/Time   COLORURINE YELLOW 06/26/2017 0054   APPEARANCEUR CLEAR 06/26/2017 0054   LABSPEC 1.025 06/26/2017 0054   PHURINE 5.0 06/26/2017 0054   GLUCOSEU NEGATIVE 06/26/2017 0054   HGBUR NEGATIVE 06/26/2017 0054   BILIRUBINUR NEGATIVE 06/26/2017 0054   KETONESUR 5 (A) 06/26/2017 0054   PROTEINUR NEGATIVE 06/26/2017 0054   NITRITE NEGATIVE 06/26/2017 0054   LEUKOCYTESUR NEGATIVE 06/26/2017 0054   Sepsis Labs: @LABRCNTIP (procalcitonin:4,lacticidven:4) )No results found for this or any previous visit (from the past 240 hour(s)).   Radiological Exams on Admission: Ct Abdomen Pelvis W Contrast  Result Date: 06/26/2017 CLINICAL DATA:  Bloody stool EXAM: CT ABDOMEN AND PELVIS WITH CONTRAST TECHNIQUE: Multidetector CT imaging of the abdomen and pelvis was performed using the standard protocol following bolus administration of intravenous contrast. CONTRAST:  76m ISOVUE-300 IOPAMIDOL (ISOVUE-300) INJECTION 61% COMPARISON:  None. FINDINGS: Lower chest: No acute abnormality. Hepatobiliary: Probable focal fat infiltration near the falciform ligament. No calcified gallstones or biliary dilatation Pancreas: Unremarkable. No pancreatic ductal dilatation or surrounding inflammatory changes. Spleen: Normal in size without focal  abnormality. Adrenals/Urinary Tract: Adrenal glands are unremarkable. Kidneys are normal, without renal calculi, focal lesion, or hydronephrosis. Bladder is unremarkable. Stomach/Bowel: Stomach is nonenlarged. No dilated small bowel. Wall thickening and mucosal enhancement involving long segment of distal/terminal ileum. Diffuse wall thickening of the colon with mucosal enhancement. Vascular/Lymphatic: Nonaneurysmal aorta. No significantly enlarged lymph nodes Reproductive: Uterus contains multiple enhancing masses, for example right fundal mass measures 3 cm, right posterior myometrial exophytic mass measures 3 cm. No adnexal mass. Other: Negative for free air or free fluid. Musculoskeletal: No acute or significant osseous findings. IMPRESSION: 1. Long segment wall thickening and mucosal enhancement involving the distal/terminal ileum with diffuse wall thickening and mucosal enhancement of the colon, consistent with ileitis and colitis, in keeping with the history of inflammatory bowel disease. No obstruction identified. 2. Fibroid uterus Electronically Signed   By: KDonavan FoilM.D.   On: 06/26/2017 03:43     Assessment/Plan Principal Problem:   Exacerbation of  Crohn's disease ([place]) Active Problems:   Normochromic normocytic anemia   Crohn's colitis ([place])    1. Exacerbation of Crohn's disease -for now we are giving IV fluids and pain medications will get gastroenterology's input in the morning.  No signs of obstruction at this time. 2. Anemia likely from blood loss and Crohn's disease -patient is on iron supplements and B12 supplements.  Which will be continued follow CBC.  Type and screen.   DVT prophylaxis: SCDs. Code Status: Full code. Family Communication: Discussed with patient. Disposition Plan: Home. Consults called: None. Admission status: Observation.   Rise Patience MD [facility] Pager [phone].  If 7PM-7AM, please contact  night-coverage [email or web address] Password [facility]  06/26/2017, 5:31 AM

## 2017-06-26 NOTE — Progress Notes (Signed)
Patient is admitted early this morning for possible Crohn's exacerbation Feeling better now, significant other at bedside Case  discussed with  GI who will consult

## 2017-06-27 DIAGNOSIS — D649 Anemia, unspecified: Secondary | ICD-10-CM | POA: Diagnosis not present

## 2017-06-27 DIAGNOSIS — E440 Moderate protein-calorie malnutrition: Secondary | ICD-10-CM

## 2017-06-27 DIAGNOSIS — R197 Diarrhea, unspecified: Secondary | ICD-10-CM

## 2017-06-27 DIAGNOSIS — R1084 Generalized abdominal pain: Secondary | ICD-10-CM

## 2017-06-27 DIAGNOSIS — A0472 Enterocolitis due to Clostridium difficile, not specified as recurrent: Secondary | ICD-10-CM | POA: Diagnosis not present

## 2017-06-27 DIAGNOSIS — K5090 Crohn's disease, unspecified, without complications: Secondary | ICD-10-CM | POA: Diagnosis not present

## 2017-06-27 LAB — COMPREHENSIVE METABOLIC PANEL
ALT: 9 U/L — ABNORMAL LOW (ref 14–54)
ANION GAP: 7 (ref 5–15)
AST: 14 U/L — ABNORMAL LOW (ref 15–41)
Albumin: 2.5 g/dL — ABNORMAL LOW (ref 3.5–5.0)
Alkaline Phosphatase: 47 U/L (ref 38–126)
BILIRUBIN TOTAL: 0.3 mg/dL (ref 0.3–1.2)
BUN: 6 mg/dL (ref 6–20)
CO2: 25 mmol/L (ref 22–32)
Calcium: 8.5 mg/dL — ABNORMAL LOW (ref 8.9–10.3)
Chloride: 105 mmol/L (ref 101–111)
Creatinine, Ser: 0.72 mg/dL (ref 0.44–1.00)
Glucose, Bld: 86 mg/dL (ref 65–99)
POTASSIUM: 4.3 mmol/L (ref 3.5–5.1)
Sodium: 137 mmol/L (ref 135–145)
TOTAL PROTEIN: 7.1 g/dL (ref 6.5–8.1)

## 2017-06-27 LAB — ABO/RH: ABO/RH(D): B NEG

## 2017-06-27 MED ORDER — CLONAZEPAM 0.5 MG PO TABS
0.2500 mg | ORAL_TABLET | Freq: Two times a day (BID) | ORAL | Status: DC | PRN
  Administered 2017-06-27: 0.25 mg via ORAL
  Filled 2017-06-27: qty 1

## 2017-06-27 MED ORDER — ALPRAZOLAM 0.25 MG PO TABS
0.2500 mg | ORAL_TABLET | Freq: Once | ORAL | Status: AC
  Administered 2017-06-27: 0.25 mg via ORAL
  Filled 2017-06-27: qty 1

## 2017-06-27 MED ORDER — SACCHAROMYCES BOULARDII 250 MG PO CAPS
250.0000 mg | ORAL_CAPSULE | Freq: Two times a day (BID) | ORAL | Status: DC
Start: 2017-06-27 — End: 2017-06-28
  Administered 2017-06-27 – 2017-06-28 (×3): 250 mg via ORAL
  Filled 2017-06-27 (×3): qty 1

## 2017-06-27 NOTE — Progress Notes (Signed)
PROGRESS NOTE  Whitney Lowe GUR:[id number] DOB: Jun 11, 1985 DOA: 06/26/2017 PCP: Patient, No Pcp Per  HPI/Recap of past 24 hours:  She has anxiety episode last night, required one dose of xanex Diarrhea slowed done, ab pain is improving No vomiting, no fever She is talking to her sister over the phone  Assessment/Plan: Principal Problem:   Exacerbation of Crohn's disease ([place]) Active Problems:   Normochromic normocytic anemia   Crohn's colitis ([place])   Malnutrition of moderate degree  Crohn's flare up: - Ileocolonic Crohns dx 06/2016.  Treated with Humira and prednisone.   -She presented with bloody diarrhea, dehydration, abdominal pain -GI consulted, started on IV steroid,  -Adalimumab antibody and drug level in process -Elevated CRP -Improving, sinus tachycardia has resolved , less diarrhea , less pain  -Meds adjustment per GI, appreciate GI input  cdiff antigen +, toxin negative,  Case discussed with gi  due to her immunosuppression status, ab pain and severe diarrhea, will treat with oral vanc She is started on probiotic as well  Thrombocytosis: plt 582 on presentation, likely reactive Improving  Chronic normocytic anemia Hemoglobin stable around 11. On chronic iron and B12 supplement at home  Non-severe (moderate) malnutrition in context of acute illness/injury Body mass index is 19.35 kg/m. Nutrition input appreciated  Anxiety: prn klonopin   Code Status: full  Family Communication: patient and sister over the phone  Disposition Plan: improving, likely able to d/c in 1-2 days , need gi clearance   Consultants:  lbgi  Procedures:  none  Antibiotics:  Oral vanc   Objective: BP 118/72 (BP Location: Left Arm)   Pulse 95   Temp 97.8 F (36.6 C) (Oral)   Resp 18   Ht 4' 10"  (1.473 m)   Wt 42 kg (92 lb 9.5 oz)   LMP 06/09/2017 Comment: negative urine pregnancy test 06/26/17  SpO2 98%   BMI 19.35 kg/m   Intake/Output Summary (Last 24 hours)  at 06/27/2017 1016 Last data filed at 06/27/2017 0600 Gross per 24 hour  Intake 1621.25 ml  Output -  Net 1621.25 ml   Filed Weights   06/25/17 2147 06/26/17 0629  Weight: 40.4 kg (89 lb) 42 kg (92 lb 9.5 oz)    Exam: Patient is examined daily including today on 06/27/2017, exams remain the same as of yesterday except that has changed    General:  NAD  Cardiovascular: sinus tachycardia has resolved  Respiratory: CTABL  Abdomen: Soft/ND/NT, positive BS  Musculoskeletal: No Edema  Neuro: alert, oriented   Data Reviewed: Basic Metabolic Panel: Recent Labs  Lab 06/25/17 2224  NA 137  K 4.0  CL 107  CO2 24  GLUCOSE 104*  BUN 9  CREATININE 0.87  CALCIUM 8.8*   Liver Function Tests: Recent Labs  Lab 06/25/17 2224  AST 15  ALT 10*  ALKPHOS 53  BILITOT <0.1*  PROT 7.2  ALBUMIN 2.7*   No results for input(s): LIPASE, AMYLASE in the last 168 hours. No results for input(s): AMMONIA in the last 168 hours. CBC: Recent Labs  Lab 06/25/17 2224 06/26/17 0647 06/26/17 1120  WBC 8.0 7.8 7.8  HGB 11.8* 11.9* 11.5*  HCT 36.0 36.4 35.5*  MCV 83.1 83.7 83.3  PLT 582* 569* 496*   Cardiac Enzymes:   No results for input(s): CKTOTAL, CKMB, CKMBINDEX, TROPONINI in the last 168 hours. BNP (last 3 results) No results for input(s): BNP in the last 8760 hours.  ProBNP (last 3 results) No results for input(s): PROBNP in  the last 8760 hours.  CBG: No results for input(s): GLUCAP in the last 168 hours.  Recent Results (from the past 240 hour(s))  Gastrointestinal Panel by PCR , Stool     Status: None   Collection Time: 06/26/17 12:54 AM  Result Value Ref Range Status   Campylobacter species NOT DETECTED NOT DETECTED Final   Plesimonas shigelloides NOT DETECTED NOT DETECTED Final   Salmonella species NOT DETECTED NOT DETECTED Final   Yersinia enterocolitica NOT DETECTED NOT DETECTED Final   Vibrio species NOT DETECTED NOT DETECTED Final   Vibrio cholerae NOT DETECTED NOT  DETECTED Final   Enteroaggregative E coli (EAEC) NOT DETECTED NOT DETECTED Final   Enteropathogenic E coli (EPEC) NOT DETECTED NOT DETECTED Final   Enterotoxigenic E coli (ETEC) NOT DETECTED NOT DETECTED Final   Shiga like toxin producing E coli (STEC) NOT DETECTED NOT DETECTED Final   Shigella/Enteroinvasive E coli (EIEC) NOT DETECTED NOT DETECTED Final   Cryptosporidium NOT DETECTED NOT DETECTED Final   Cyclospora cayetanensis NOT DETECTED NOT DETECTED Final   Entamoeba histolytica NOT DETECTED NOT DETECTED Final   Giardia lamblia NOT DETECTED NOT DETECTED Final   Adenovirus F40/41 NOT DETECTED NOT DETECTED Final   Astrovirus NOT DETECTED NOT DETECTED Final   Norovirus GI/GII NOT DETECTED NOT DETECTED Final   Rotavirus A NOT DETECTED NOT DETECTED Final   Sapovirus (I, II, IV, and V) NOT DETECTED NOT DETECTED Final    Comment: Performed at [facility], [address] [place], [place] [address]  C difficile quick scan w PCR reflex     Status: Abnormal   Collection Time: 06/26/17 12:53 PM  Result Value Ref Range Status   C Diff antigen POSITIVE (A) NEGATIVE Final   C Diff toxin NEGATIVE NEGATIVE Final   C Diff interpretation Results are indeterminate. See PCR results.  Final    Comment: Performed at [facility], [place] [address], [place], [place] [address]  C. Diff by PCR, Reflexed     Status: Abnormal   Collection Time: 06/26/17 12:53 PM  Result Value Ref Range Status   Toxigenic C. Difficile by PCR POSITIVE (A) NEGATIVE Final    Comment: Positive for toxigenic C. difficile with little to no toxin production. Only treat if clinical presentation suggests symptomatic illness. Performed at [facility], [place] [address], [place], [place] [address]      Studies: No results found.  Scheduled Meds: . feeding supplement  1 Container Oral BID BM  . ferrous sulfate  325 mg Oral Daily  . methylPREDNISolone (SOLU-MEDROL) injection  15.2 mg Intravenous TID    . multivitamin with minerals  1 tablet Oral Daily  . vancomycin  125 mg Oral Q6H  . vitamin B-12  1,000 mcg Oral Daily    Continuous Infusions:   Time spent: 63mns, case discussed with GI I have personally reviewed and interpreted on  06/27/2017 daily labs, tele strips, imagings as discussed above under date review session and assessment and plans.  I reviewed all nursing notes, pharmacy notes, consultant notes,  vitals, pertinent old records  I have discussed plan of care as described above with RN , patient and family on 06/27/2017   FFlorencia ReasonsMD, PhD  [facility] Pager [phone] If 7PM-7AM, please contact night-coverage at [email or web address], password [email or web address]3/12/2017, 10:16 AM  LOS: 0 days

## 2017-06-27 NOTE — Progress Notes (Signed)
Daily Rounding Note  06/27/2017, 10:40 AM  LOS: 0 days   SUBJECTIVE:   Chief complaint:     Bloody, frequent stools: improved with fewer stools and less blood overnight and this AM.  2 stools since sunrise today.  Got anxious and emotional overnight, attributes to solumedrol and being in  Hospital for the first time.  Feeling better after Xanax, speaking to her mom and getting more sleep than PTA.  Abdominal pain better but rectal pain persists, topical lidocaine is helping.  Tolerating solids  OBJECTIVE:         Vital signs in last 24 hours:    Temp:  [97.8 F (36.6 C)-98.1 F (36.7 C)] 97.8 F (36.6 C) (03/09 0643) Pulse Rate:  [95-124] 95 (03/09 0643) Resp:  [18] 18 (03/09 0643) BP: (101-118)/(60-72) 118/72 (03/09 0643) SpO2:  [97 %-100 %] 98 % (03/09 0643) Last BM Date: 06/26/17 Filed Weights   06/25/17 2147 06/26/17 0629  Weight: 89 lb (40.4 kg) 92 lb 9.5 oz (42 kg)   General: thin, pleasant, looks malnourished but not toxic or acutely ill.     Heart: RRR Chest: clear bil.  No SOB or cough Abdomen: soft, mild to moderate tenderness in mid abdomen.    Extremities: no CCE Neuro/Psych:  Oriented x 3.  Pleasant, no deficits.  Talkative, hyper.    Intake/Output from previous day: 03/08 0701 - 03/09 0700 In: 1621.3 [P.O.:180; I.V.:1441.3] Out: -   Intake/Output this shift: No intake/output data recorded.  Lab Results: Recent Labs    06/25/17 2224 06/26/17 0647 06/26/17 1120  WBC 8.0 7.8 7.8  HGB 11.8* 11.9* 11.5*  HCT 36.0 36.4 35.5*  PLT 582* 569* 496*   BMET Recent Labs    06/25/17 2224  NA 137  K 4.0  CL 107  CO2 24  GLUCOSE 104*  BUN 9  CREATININE 0.87  CALCIUM 8.8*   LFT Recent Labs    06/25/17 2224  PROT 7.2  ALBUMIN 2.7*  AST 15  ALT 10*  ALKPHOS 53  BILITOT <0.1*   PT/INR No results for input(s): LABPROT, INR in the last 72 hours. Hepatitis Panel No results for input(s):  HEPBSAG, HCVAB, HEPAIGM, HEPBIGM in the last 72 hours.  Studies/Results: Ct Abdomen Pelvis W Contrast  Result Date: 06/26/2017 CLINICAL DATA:  Bloody stool EXAM: CT ABDOMEN AND PELVIS WITH CONTRAST TECHNIQUE: Multidetector CT imaging of the abdomen and pelvis was performed using the standard protocol following bolus administration of intravenous contrast. CONTRAST:  58m ISOVUE-300 IOPAMIDOL (ISOVUE-300) INJECTION 61% COMPARISON:  None. FINDINGS: Lower chest: No acute abnormality. Hepatobiliary: Probable focal fat infiltration near the falciform ligament. No calcified gallstones or biliary dilatation Pancreas: Unremarkable. No pancreatic ductal dilatation or surrounding inflammatory changes. Spleen: Normal in size without focal abnormality. Adrenals/Urinary Tract: Adrenal glands are unremarkable. Kidneys are normal, without renal calculi, focal lesion, or hydronephrosis. Bladder is unremarkable. Stomach/Bowel: Stomach is nonenlarged. No dilated small bowel. Wall thickening and mucosal enhancement involving long segment of distal/terminal ileum. Diffuse wall thickening of the colon with mucosal enhancement. Vascular/Lymphatic: Nonaneurysmal aorta. No significantly enlarged lymph nodes Reproductive: Uterus contains multiple enhancing masses, for example right fundal mass measures 3 cm, right posterior myometrial exophytic mass measures 3 cm. No adnexal mass. Other: Negative for free air or free fluid. Musculoskeletal: No acute or significant osseous findings. IMPRESSION: 1. Long segment wall thickening and mucosal enhancement involving the distal/terminal ileum with diffuse wall thickening and mucosal enhancement of the  colon, consistent with ileitis and colitis, in keeping with the history of inflammatory bowel disease. No obstruction identified. 2. Fibroid uterus Electronically Signed   By: Donavan Foil M.D.   On: 06/26/2017 03:43   Scheduled Meds: . feeding supplement  1 Container Oral BID BM  . ferrous  sulfate  325 mg Oral Daily  . methylPREDNISolone (SOLU-MEDROL) injection  15.2 mg Intravenous TID  . multivitamin with minerals  1 tablet Oral Daily  . vancomycin  125 mg Oral Q6H  . vitamin B-12  1,000 mcg Oral Daily   Continuous Infusions: PRN Meds:.acetaminophen **OR** acetaminophen, clonazePAM, fentaNYL (SUBLIMAZE) injection, lidocaine, ondansetron **OR** ondansetron (ZOFRAN) IV  ASSESMENT:   *    Ileocolonic Crohns dx 06/2016.  Treated with Humira and prednisone.   Flare of sxs during Prednisone wean, with bloody increased frequency of BMs since 04/2017.  CT confirming ileocolitis. IV Solumedrol day 2.  Humira Ab and drug level sent.       C diff Ag +, toxin negative (inconclusive, indeterminate result).  Oral Vanc ordered.  sxs improved over night.    *  Mild anemia.  Once daily home FESO4 and oral Vit B12 continue.  Hgb stable.  *  Protein malnutriotion  PLAN:     *  Agree with oral vanc, added by hospitalist NP, for equivocal C diff given her relative complicated GI.   disease.    *  Discontinued Tele monitor      Azucena Freed  06/27/2017, 10:40 AM Pager: [phone]

## 2017-06-28 DIAGNOSIS — E440 Moderate protein-calorie malnutrition: Secondary | ICD-10-CM | POA: Diagnosis not present

## 2017-06-28 DIAGNOSIS — A0472 Enterocolitis due to Clostridium difficile, not specified as recurrent: Secondary | ICD-10-CM | POA: Diagnosis not present

## 2017-06-28 DIAGNOSIS — R197 Diarrhea, unspecified: Secondary | ICD-10-CM | POA: Diagnosis not present

## 2017-06-28 DIAGNOSIS — K5090 Crohn's disease, unspecified, without complications: Secondary | ICD-10-CM | POA: Diagnosis not present

## 2017-06-28 DIAGNOSIS — R1084 Generalized abdominal pain: Secondary | ICD-10-CM | POA: Diagnosis not present

## 2017-06-28 LAB — BASIC METABOLIC PANEL
ANION GAP: 8 (ref 5–15)
CALCIUM: 8.7 mg/dL — AB (ref 8.9–10.3)
CO2: 26 mmol/L (ref 22–32)
Chloride: 105 mmol/L (ref 101–111)
Creatinine, Ser: 0.7 mg/dL (ref 0.44–1.00)
GFR calc Af Amer: >60 mL/min (ref >60)
GLUCOSE: 107 mg/dL — AB (ref 65–99)
Potassium: 4.1 mmol/L (ref 3.5–5.1)
Sodium: 139 mmol/L (ref 135–145)

## 2017-06-28 LAB — CBC WITH DIFFERENTIAL/PLATELET
BASOS ABS: 0 10*3/uL (ref 0.0–0.1)
Basophils Relative: 0 %
EOS ABS: 0 10*3/uL (ref 0.0–0.7)
Eosinophils Relative: 0 %
HCT: 36.1 % (ref 36.0–46.0)
Hemoglobin: 11.6 g/dL — ABNORMAL LOW (ref 12.0–15.0)
Lymphocytes Relative: 31 %
Lymphs Abs: 2.2 10*3/uL (ref 0.7–4.0)
MCH: 26.9 pg (ref 26.0–34.0)
MCHC: 32.1 g/dL (ref 30.0–36.0)
MCV: 83.6 fL (ref 78.0–100.0)
Monocytes Absolute: 0.6 10*3/uL (ref 0.1–1.0)
Monocytes Relative: 8 %
NEUTROS ABS: 4.4 10*3/uL (ref 1.7–7.7)
NEUTROS PCT: 61 %
Platelets: 609 10*3/uL — ABNORMAL HIGH (ref 150–400)
RBC: 4.32 MIL/uL (ref 3.87–5.11)
RDW: 16.5 % — ABNORMAL HIGH (ref 11.5–15.5)
WBC: 7.2 10*3/uL (ref 4.0–10.5)

## 2017-06-28 LAB — MAGNESIUM: MAGNESIUM: 2.1 mg/dL (ref 1.7–2.4)

## 2017-06-28 LAB — C-REACTIVE PROTEIN: CRP: 6.7 mg/dL — AB (ref <1.0)

## 2017-06-28 MED ORDER — VANCOMYCIN HCL 125 MG PO CAPS: 125.0000 mg | ORAL_CAPSULE | Freq: Four times a day (QID) | ORAL | 0 refills | Status: AC

## 2017-06-28 MED ORDER — PREDNISONE 20 MG PO TABS
40.0000 mg | ORAL_TABLET | Freq: Every day | ORAL | Status: DC
Start: 2017-06-28 — End: 2017-06-28
  Administered 2017-06-28: 40 mg via ORAL
  Filled 2017-06-28: qty 2

## 2017-06-28 MED ORDER — PREDNISONE 20 MG PO TABS: 40.0000 mg | ORAL_TABLET | Freq: Every day | ORAL | 0 refills | Status: AC

## 2017-06-28 MED ORDER — SACCHAROMYCES BOULARDII 250 MG PO CAPS: 250.0000 mg | ORAL_CAPSULE | Freq: Two times a day (BID) | ORAL | 0 refills | Status: DC

## 2017-06-28 MED ORDER — ADULT MULTIVITAMIN W/MINERALS CH: 1.0000 | ORAL_TABLET | Freq: Every day | ORAL | 0 refills | Status: DC

## 2017-06-28 MED ORDER — ONDANSETRON HCL 4 MG PO TABS: 4.0000 mg | ORAL_TABLET | Freq: Three times a day (TID) | ORAL | 0 refills | Status: DC | PRN

## 2017-06-28 MED ORDER — CLONAZEPAM 0.5 MG PO TABS: 0.2500 mg | ORAL_TABLET | Freq: Two times a day (BID) | ORAL | 0 refills | Status: DC | PRN

## 2017-06-28 MED ORDER — PREDNISONE 5 MG PO TABS: ORAL_TABLET | ORAL | 0 refills | Status: DC

## 2017-06-28 MED ORDER — OXYCODONE-ACETAMINOPHEN 5-325 MG PO TABS: 1.0000 | ORAL_TABLET | Freq: Three times a day (TID) | ORAL | 0 refills | Status: DC | PRN

## 2017-06-28 NOTE — Progress Notes (Signed)
Pt was discharged with all belongings. Reviewed AVS with pt. PT able to verbalize understanding and teachback information. Left with family. No apparent signs of distress

## 2017-06-28 NOTE — Progress Notes (Signed)
          Daily Rounding Note  06/28/2017, 8:22 AM  LOS: 1 day   SUBJECTIVE:   Chief complaint:     Feels way better.  4 stools well spread throughout the day yesterday.  1 stool this AM.  No blood.  Tolerating solids.  Concerned re: new onset acne in setting of Prednisone.    OBJECTIVE:         Vital signs in last 24 hours:    Temp:  [97.6 F (36.4 C)-98.5 F (36.9 C)] 98 F (36.7 C) (03/10 0618) Pulse Rate:  [86-96] 86 (03/10 0618) Resp:  [16-18] 18 (03/10 0618) BP: (98-137)/(64-82) 99/64 (03/10 0618) SpO2:  [92 %-100 %] 100 % (03/10 0618) Weight:  [179 lb 1.6 oz (81.2 kg)] 179 lb 1.6 oz (81.2 kg) (03/09 1749) Last BM Date: 06/27/17 Filed Weights   06/25/17 2147 06/26/17 0629 06/27/17 1749  Weight: 89 lb (40.4 kg) 92 lb 9.5 oz (42 kg) 179 lb 1.6 oz (81.2 kg)   General: pleasant, animated, comfortable, engaged.  thiin but not ill looking   Heart: RRR Chest: clear bil.  No labored breathing or cough Abdomen: soft, NT, ND.  Active BS  Extremities: no CCE Neuro/Psych:  Alert. Fully oriented.  No tremors.  Intake/Output from previous day: 03/09 0701 - 03/10 0700 In: 240 [P.O.:240] Out: 600 [Urine:600]  Intake/Output this shift: No intake/output data recorded.  Lab Results: Recent Labs    06/26/17 0647 06/26/17 1120 06/28/17 0549  WBC 7.8 7.8 7.2  HGB 11.9* 11.5* 11.6*  HCT 36.4 35.5* 36.1  PLT 569* 496* 609*   BMET Recent Labs    06/25/17 2224 06/27/17 1245 06/28/17 0549  NA 137 137 139  K 4.0 4.3 4.1  CL 107 105 105  CO2 24 25 26   GLUCOSE 104* 86 107*  BUN 9 6 <5*  CREATININE 0.87 0.72 0.70  CALCIUM 8.8* 8.5* 8.7*   LFT Recent Labs    06/25/17 2224 06/27/17 1245  PROT 7.2 7.1  ALBUMIN 2.7* 2.5*  AST 15 14*  ALT 10* 9*  ALKPHOS 53 47  BILITOT <0.1* 0.3   PT/INR No results for input(s): LABPROT, INR in the last 72 hours. Hepatitis Panel No results for input(s): HEPBSAG, HCVAB, HEPAIGM,  HEPBIGM in the last 72 hours.  Studies/Results: No results found.  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  IV Solumedrol day 3. Day 1-2 of oral Vancomycin, Florastor.    *  Chronic anemia.   On oral iron, B12 at home.    *  Protein malnutrition.      PLAN   *   Begin Prednisone 40 mg daily.  Taper by 5 mg every 7 days as tolerated  *  Awaiting Humira Ab and drug level testing as may need updosing to weekly Humira.    *  Discussed pt's need for reliable birth control (She is not on OC or other long-term contraception).  She is occasionally sexually active.  Explained risk of pregnancy higher if she becomes pregnant with uncontrolled Crohn's and that not all meds safe in pregnancy.    *  ? Home later today with GI ROV within 2 weeks?   Azucena Freed  06/28/2017, 8:22 AM Pager: [phone]

## 2017-06-28 NOTE — Discharge Summary (Signed)
Discharge Summary  Whitney Lowe ACZ:[id number] DOB: Jan 30, 1986  PCP: Glendale Chard, MD  Admit date: 06/26/2017 Discharge date: 06/28/2017  Time spent: <57mns  Recommendations for Outpatient Follow-up:  1. F/u with PMD within a week  for hospital discharge follow up, repeat cbc/bmp at follow up 2. F/u with GI in two weeks  Discharge Diagnoses:  Active Hospital Problems   Diagnosis Date Noted  . Ex[place]ation of [place]'s disease ([place] 06/26/2017  . [place] disease ([place] 06/27/2017  . Normochromic normocytic anemia 06/26/2017  . Crohn's colitis ([place] [facility] 06/26/2017  . Malnutrition of moderate degree 06/26/2017    Resolved Hospital Problems  No resolved problems to display.    Discharge Condition: stable  Diet recommendation: regular diet  Filed Weights   06/25/17 2147 06/26/17 0629 06/27/17 1749  Weight: 40.4 kg (89 lb) 42 kg (92 lb 9.5 oz) 81.2 kg (179 lb 1.6 oz)    History of present illness:  Patient coming from: Home.  Chief Complaint: Abdominal pain and bloody diarrhea.  HPI: Whitney Nellumsis a 32y.o. female with history of Crohn's disease diagnosed in March 2018 [facility] [place] presents with worsening abdominal pain and bloody diarrhea over the last 1 week.  Denies any vomiting.  Pain is mostly in the epigastric area.  Patient states last 2 days her diarrhea is worsened.  Patient feels slightly dizzy did not pass out.  When she eats the diarrhea gets worse.    Patient states initially when she was diagnosed she was on prednisone when she did well.  But for last 3 months she was only on Humira and her exacerbations have become more frequent.  ED Course: The ER patient is tachycardic with hemoglobin around 11.  Had more bowel movements which were bloody but the last 2 were nonbloody.  CT scan shows features consistent with Crohn's exacerbation.  Patient is being admitted for further  management.    Hospital Course:  Principal Problem:   Exacerbation of [place]'s disease ([facility] Active Problems:   Normochromic normocytic anemia   Crohn's colitis ([facility]   Malnutrition of moderate degree   Crohn disease ([facility]  Crohn's flare up: -Ileocolonic Crohns dx 06/2016. Treated with Humira and prednisone.  -She presented with bloody diarrhea, dehydration, abdominal pain -GI consulted, started on IV steroid,  -Adalimumab antibody and drug level in process -Elevated CRPon presentation, now trending down -Improving, sinus tachycardia has resolved , less diarrhea , less pain , tolerate regular diet -she is cleared to d/c home on slow prednisone taper, (detail please refer to gi note), she is to follow up with gi closely.  cdiff antigen +, toxin negative,  Case discussed with gi  due to her immunosuppression status, ab pain and severe diarrhea, treat with oral vanc per gi recommendation She is started on probiotic as well  Thrombocytosis: plt 582 on presentation, likely reactive Improving  Chronic normocytic anemia Hemoglobin stable around 11. On chronic iron and B12 supplement at home  Non-severe (moderate) malnutrition in context of acute illness/injury Body mass index is 19.35 kg/m. Nutrition input appreciated  Anxiety: prn klonopin   Code Status: full  Family Communication: patient and mother at bedside  Disposition Plan: d/c home with gi clearance   Consultants:  lbgi  Procedures:  none  Antibiotics:  Oral vanc   Discharge Exam: BP 99/64 (BP Location: Right Arm)   Pulse 86   Temp 98 F (36.7 C) (Oral)  Resp 18   Ht 4' 10"  (1.473 m)   Wt 81.2 kg (179 lb 1.6 oz)   LMP 06/09/2017 Comment: negative urine pregnancy test 06/26/17  SpO2 100%   BMI 37.43 kg/m   General: NAD Cardiovascular: RRR Respiratory: CTABL  Discharge Instructions You were cared for by a hospitalist during your hospital stay. If you have any questions about  your discharge medications or the care you received while you were in the hospital after you are discharged, you can call the unit and asked to speak with the hospitalist on call if the hospitalist that took care of you is not available. Once you are discharged, your primary care physician will handle any further medical issues. Please note that NO REFILLS for any discharge medications will be authorized once you are discharged, as it is imperative that you return to your primary care physician (or establish a relationship with a primary care physician if you do not have one) for your aftercare needs so that they can reassess your need for medications and monitor your lab values.   Allergies as of 06/28/2017   No Known Allergies     Medication List    TAKE these medications   cholecalciferol 1000 units tablet Commonly known as:  VITAMIN D Take 1,000 Units by mouth daily.   clonazePAM 0.5 MG tablet Commonly known as:  KLONOPIN Take 0.5 tablets (0.25 mg total) by mouth 2 (two) times daily as needed (anxiety).   HUMIRA 40 MG/0.4ML Pskt Generic drug:  Adalimumab Inject 40 mg into the skin every 14 (fourteen) days.   Iron 28 MG Tabs Take 1 tablet by mouth daily.   multivitamin with minerals Tabs tablet Take 1 tablet by mouth daily. Start taking on:  06/29/2017   ondansetron 4 MG tablet Commonly known as:  ZOFRAN Take 1 tablet (4 mg total) by mouth every 8 (eight) hours as needed for nausea or vomiting.   oxyCODONE-acetaminophen 5-325 MG tablet Commonly known as:  PERCOCET Take 1 tablet by mouth every 8 (eight) hours as needed for severe pain.   predniSONE 20 MG tablet Commonly known as:  DELTASONE Take 2 tablets (40 mg total) by mouth daily with breakfast for 7 days. Start taking on:  06/29/2017   predniSONE 5 MG tablet Commonly known as:  DELTASONE Label  & dispense according to the schedule below.  7 Pills PO for 7 days then, 6 Pills PO for 7days, 5 Pills PO for 3 days, 4 Pills  PO for 7 days, 3 Pills PO for 7 days, 2 Pills PO for 7 days, 1 Pill  PO for 7 days then STOP. Start taking on:  07/06/2017   saccharomyces boulardii 250 MG capsule Commonly known as:  FLORASTOR Take 1 capsule (250 mg total) by mouth 2 (two) times daily.   vancomycin 125 MG capsule Commonly known as:  VANCOCIN HCL Take 1 capsule (125 mg total) by mouth 4 (four) times daily for 7 days.   vitamin B-12 1000 MCG tablet Commonly known as:  CYANOCOBALAMIN Take 1,000 mcg by mouth daily.      No Known Allergies Follow-up Information    Glendale Chard, MD Follow up in 1 week(s).   Specialty:  Internal Medicine Why:  establish care and hospital discharge follow up, repeat cbc/bmp at hospital discharge follow up. Contact information: [address] [place] [address] [phone]        [name], MD Follow up in 2 week(s).   Specialty:  Gastroenterology  Contact information: [address] [place] [address] [phone]            The results of significant diagnostics from this hospitalization (including imaging, microbiology, ancillary and laboratory) are listed below for reference.    Significant Diagnostic Studies: Ct Abdomen Pelvis W Contrast  Result Date: 06/26/2017 CLINICAL DATA:  Bloody stool EXAM: CT ABDOMEN AND PELVIS WITH CONTRAST TECHNIQUE: Multidetector CT imaging of the abdomen and pelvis was performed using the standard protocol following bolus administration of intravenous contrast. CONTRAST:  62m ISOVUE-300 IOPAMIDOL (ISOVUE-300) INJECTION 61% COMPARISON:  None. FINDINGS: Lower chest: No acute abnormality. Hepatobiliary: Probable focal fat infiltration near the falciform ligament. No calcified gallstones or biliary dilatation Pancreas: Unremarkable. No pancreatic ductal dilatation or surrounding inflammatory changes. Spleen: Normal in size without focal abnormality. Adrenals/Urinary Tract: Adrenal glands are unremarkable. Kidneys are  normal, without renal calculi, focal lesion, or hydronephrosis. Bladder is unremarkable. Stomach/Bowel: Stomach is nonenlarged. No dilated small bowel. Wall thickening and mucosal enhancement involving long segment of distal/terminal ileum. Diffuse wall thickening of the colon with mucosal enhancement. Vascular/Lymphatic: Nonaneurysmal aorta. No significantly enlarged lymph nodes Reproductive: Uterus contains multiple enhancing masses, for example right fundal mass measures 3 cm, right posterior myometrial exophytic mass measures 3 cm. No adnexal mass. Other: Negative for free air or free fluid. Musculoskeletal: No acute or significant osseous findings. IMPRESSION: 1. Long segment wall thickening and mucosal enhancement involving the distal/terminal ileum with diffuse wall thickening and mucosal enhancement of the colon, consistent with ileitis and colitis, in keeping with the history of inflammatory bowel disease. No obstruction identified. 2. Fibroid uterus Electronically Signed   By: KDonavan FoilM.D.   On: 06/26/2017 03:43    Microbiology: Recent Results (from the past 240 hour(s))  Gastrointestinal Panel by PCR , Stool     Status: None   Collection Time: 06/26/17 12:54 AM  Result Value Ref Range Status   Campylobacter species NOT DETECTED NOT DETECTED Final   Plesimonas shigelloides NOT DETECTED NOT DETECTED Final   Salmonella species NOT DETECTED NOT DETECTED Final   Yersinia enterocolitica NOT DETECTED NOT DETECTED Final   Vibrio species NOT DETECTED NOT DETECTED Final   Vibrio cholerae NOT DETECTED NOT DETECTED Final   Enteroaggregative E coli (EAEC) NOT DETECTED NOT DETECTED Final   Enteropathogenic E coli (EPEC) NOT DETECTED NOT DETECTED Final   Enterotoxigenic E coli (ETEC) NOT DETECTED NOT DETECTED Final   Shiga like toxin producing E coli (STEC) NOT DETECTED NOT DETECTED Final   Shigella/Enteroinvasive E coli (EIEC) NOT DETECTED NOT DETECTED Final   Cryptosporidium NOT DETECTED NOT  DETECTED Final   Cyclospora cayetanensis NOT DETECTED NOT DETECTED Final   Entamoeba histolytica NOT DETECTED NOT DETECTED Final   Giardia lamblia NOT DETECTED NOT DETECTED Final   Adenovirus F40/41 NOT DETECTED NOT DETECTED Final   Astrovirus NOT DETECTED NOT DETECTED Final   Norovirus GI/GII NOT DETECTED NOT DETECTED Final   Rotavirus A NOT DETECTED NOT DETECTED Final   Sapovirus (I, II, IV, and V) NOT DETECTED NOT DETECTED Final    Comment: Performed at [facility] [address], [place] [id number] C difficile quick scan w PCR reflex     Status: Abnormal   Collection Time: 06/26/17 12:53 PM  Result Value Ref Range Status   C Diff antigen POSITIVE (A) NEGATIVE Final   C Diff toxin NEGATIVE NEGATIVE Final   C Diff interpretation Results are indeterminate. See PCR results.  Final    Comment:  Performed at [facility], [place] [address] [place], [place] [address]  C. Diff by PCR, Reflexed     Status: Abnormal   Collection Time: 06/26/17 12:53 PM  Result Value Ref Range Status   Toxigenic C. Difficile by PCR POSITIVE (A) NEGATIVE Final    Comment: Positive for toxigenic C. difficile with little to no toxin production. Only treat if clinical presentation suggests symptomatic illness. Performed at [facility], [place] [address] [place], [place] [address]      Labs: Basic Metabolic Panel: Recent Labs  Lab 06/25/17 2224 06/27/17 1245 06/28/17 0549  NA 137 137 139  K 4.0 4.3 4.1  CL 107 105 105  CO2 24 25 26   GLUCOSE 104* 86 107*  BUN 9 6 <5*  CREATININE 0.87 0.72 0.70  CALCIUM 8.8* 8.5* 8.7*  MG  --   --  2.1   Liver Function Tests: Recent Labs  Lab 06/25/17 2224 06/27/17 1245  AST 15 14*  ALT 10* 9*  ALKPHOS 53 47  BILITOT <0.1* 0.3  PROT 7.2 7.1  ALBUMIN 2.7* 2.5*   No results for input(s): LIPASE, AMYLASE in the last 168 hours. No results for input(s): AMMONIA in the last 168 hours. CBC: Recent Labs  Lab  06/25/17 2224 06/26/17 0647 06/26/17 1120 06/28/17 0549  WBC 8.0 7.8 7.8 7.2  NEUTROABS  --   --   --  4.4  HGB 11.8* 11.9* 11.5* 11.6*  HCT 36.0 36.4 35.5* 36.1  MCV 83.1 83.7 83.3 83.6  PLT 582* 569* 496* 609*   Cardiac Enzymes: No results for input(s): CKTOTAL, CKMB, CKMBINDEX, TROPONINI in the last 168 hours. BNP: BNP (last 3 results) No results for input(s): BNP in the last 8760 hours.  ProBNP (last 3 results) No results for input(s): PROBNP in the last 8760 hours.  CBG: No results for input(s): GLUCAP in the last 168 hours.     Signed:  Florencia Reasons MD, PhD  [facility] 06/28/2017, 11:22 AM

## 2017-06-30 ENCOUNTER — Telehealth: Payer: Self-pay | Admitting: *Deleted

## 2017-06-30 NOTE — Telephone Encounter (Signed)
Patient has been scheduled to see Nicoletta Ba, PA-C on 07/22/17 at 930 am. This was as close to the 4 week period from hospitalization as we could get. Patient is also scheduled to see Dr Hilarie Fredrickson on 09/15/17 at 900 am. I have confirmed with Dr Hilarie Fredrickson that patient already has Humira and is taking this from her previous physician until we see her in clinic. We are also to request her previous GI records from [place], [place] when she comes for her clinic appointment with our office.  I have attempted to reach patient but was unsuccessful in speaking with her and no voicemail was available. I will attempt to reach her at a later time.  ----- Message ----- From: [name], MD Sent: 06/28/2017  11:15 AM To: [name], CMA Subject: Inpatient Notes                                New Crohn's patient (for me) transferring her care from [place], [place] Please ensure she has pred taper as recommended and her vanco rx For now she will continue Humira 40 mg every 14 days until I have the drug level and ab info She needs to be seen by me or APP (Amy) in about 4 week.  If with Amy, then also get her one with me in May

## 2017-07-01 NOTE — Telephone Encounter (Signed)
Patient states that she is doing better. She was able to get her prednisone and vancomycin. She is also taking her Humira at the correct intervals. Patient is currently scheduled with Amy Esterwood for 07/22/17 at 930 am since Dr Hilarie Fredrickson does not have any availability for 4 weeks out on his schedule and is scheduled to see Dr Hilarie Fredrickson on 09/15/17 at 900 am. Patient may have to call back to reschedule 07/22/17 appointment as she may be graduating that day. I advised her to ask for me should that be the case and we will get her rescheduled. She verbalizes understanding.

## 2017-07-02 LAB — MISC LABCORP TEST (SEND OUT): Labcorp test code: 504575

## 2017-07-22 ENCOUNTER — Encounter: Payer: Self-pay | Admitting: Physician Assistant

## 2017-07-22 ENCOUNTER — Ambulatory Visit (INDEPENDENT_AMBULATORY_CARE_PROVIDER_SITE_OTHER): Payer: Managed Care, Other (non HMO) | Admitting: Physician Assistant

## 2017-07-22 ENCOUNTER — Other Ambulatory Visit (INDEPENDENT_AMBULATORY_CARE_PROVIDER_SITE_OTHER): Payer: Managed Care, Other (non HMO)

## 2017-07-22 VITALS — BP 106/64 | HR 80 | Ht <= 58 in | Wt 101.0 lb

## 2017-07-22 DIAGNOSIS — K50019 Crohn's disease of small intestine with unspecified complications: Secondary | ICD-10-CM | POA: Diagnosis not present

## 2017-07-22 DIAGNOSIS — Z8619 Personal history of other infectious and parasitic diseases: Secondary | ICD-10-CM | POA: Diagnosis not present

## 2017-07-22 LAB — C-REACTIVE PROTEIN: CRP: 0.3 mg/dL — ABNORMAL LOW (ref 0.5–20.0)

## 2017-07-22 MED ORDER — HYOSCYAMINE SULFATE 0.125 MG SL SUBL: 0.1250 mg | SUBLINGUAL_TABLET | Freq: Four times a day (QID) | SUBLINGUAL | 3 refills | Status: DC | PRN

## 2017-07-22 MED ORDER — PREDNISONE 10 MG PO TABS: 20.0000 mg | ORAL_TABLET | Freq: Every day | ORAL | 3 refills | Status: DC

## 2017-07-22 NOTE — Patient Instructions (Addendum)
If you are age 33 or older, your body mass index should be between 23-30. Your Body mass index is 21.11 kg/m. If this is out of the aforementioned range listed, please consider follow up with your Primary Care Provider.  If you are age 69 or younger, your body mass index should be between 19-25. Your Body mass index is 21.11 kg/m. If this is out of the aformentioned range listed, please consider follow up with your Primary Care Provider.   Low gas literature provided.  Thank you for choosing [facility], Nicoletta Ba, PA-C

## 2017-07-22 NOTE — Progress Notes (Addendum)
Subjective:    Patient ID: Whitney Lowe, female    DOB: 05-Feb-1986, 32 y.o.   MRN: [id number]  Whitney Lowe is a pleasant 32 year old African-American female, recently known to Dr. Hilarie Lowe when seen in consultation with recent hospitalization at [facility] through 06/28/2017.  She had previously been cared for in [place] and had relocated to this area.  She was admitted with acute abdominal pain and bloody diarrhea times 1 week. She had CT of the abdomen and pelvis done on admission which showed wall thickening and mucosal enhancement involving a long segment of distal/terminal ileum, there was also diffuse wall thickening of the colon with mucosal enhancement.  Also noted a fibroid uterus. Patient was seen in consultation by GI and started on IV steroids. She had been diagnosed with Crohn's disease in 2018 and had been started on Humira in September 2018.  She apparently took steroids for several months prior to that.  She reports that her symptoms really did not significantly improve on Humira. Patient was also found to be C. difficile positive during that hospitalization and treated with vancomycin.  She completed 14 days of vancomycin in total. She has continued on prednisone and is now down to 20 mg p.o. daily. She says she feels a whole lot better than she did when she was admitted to the hospital and has no ongoing pain in her abdomen.  She does get some gas and cramping at times.  Her bowel movements are now formed and she is only very occasionally seeing a small amount of blood.  Her appetite has been good, no nausea or vomiting.  No fevers.  She feels "sore" or tender all over her body and wonders if this may be from the prednisone.  She has continued on Humira. Adalimumab antibody level was checked while she was hospitalized and this was positive at 106, drug level was 4.6.  Review of Systems Pertinent positive and negative review of systems were noted in the above HPI  section.  All other review of systems was otherwise negative.  Outpatient Encounter Medications as of 07/22/2017  Medication Sig  . Adalimumab (HUMIRA) 40 MG/0.4ML PSKT Inject 40 mg into the skin every 14 (fourteen) days.  . cholecalciferol (VITAMIN D) 1000 units tablet Take 1,000 Units by mouth daily.  . clonazePAM (KLONOPIN) 0.5 MG tablet Take 0.5 tablets (0.25 mg total) by mouth 2 (two) times daily as needed (anxiety).  . Ferrous Sulfate (IRON) 28 MG TABS Take 1 tablet by mouth daily.   . Multiple Vitamin (MULTIVITAMIN WITH MINERALS) TABS tablet Take 1 tablet by mouth daily.  . ondansetron (ZOFRAN) 4 MG tablet Take 1 tablet (4 mg total) by mouth every 8 (eight) hours as needed for nausea or vomiting.  Marland Kitchen oxyCODONE-acetaminophen (PERCOCET) 5-325 MG tablet Take 1 tablet by mouth every 8 (eight) hours as needed for severe pain.  . predniSONE (DELTASONE) 5 MG tablet Label  & dispense according to the schedule below.  7 Pills PO for 7 days then, 6 Pills PO for 7days, 5 Pills PO for 3 days, 4 Pills PO for 7 days, 3 Pills PO for 7 days, 2 Pills PO for 7 days, 1 Pill  PO for 7 days then STOP.  Marland Kitchen saccharomyces boulardii (FLORASTOR) 250 MG capsule Take 1 capsule (250 mg total) by mouth 2 (two) times daily.  . vitamin B-12 (CYANOCOBALAMIN) 1000 MCG tablet Take 1,000 mcg by mouth daily.  . hyoscyamine (LEVSIN SL) 0.125 MG SL tablet Place  1 tablet (0.125 mg total) under the tongue every 6 (six) hours as needed for cramping (and gas.).  Marland Kitchen predniSONE (DELTASONE) 10 MG tablet Take 2 tablets (20 mg total) by mouth daily with breakfast.   No facility-administered encounter medications on file as of 07/22/2017.    No Known Allergies Patient Active Problem List   Diagnosis Date Noted  . Crohn disease ([place]) 06/27/2017  . Exacerbation of Crohn's disease ([place]) 06/26/2017  . Normochromic normocytic anemia 06/26/2017  . Crohn's colitis ([place]) 06/26/2017  . Malnutrition of moderate degree 06/26/2017   Social History    Socioeconomic History  . Marital status: Single    Spouse name: Not on file  . Number of children: 0  . Years of education: Not on file  . Highest education level: Not on file  Occupational History  . Occupation: Agricultural engineer  Social Needs  . Financial resource strain: Not on file  . Food insecurity:    Worry: Not on file    Inability: Not on file  . Transportation needs:    Medical: Not on file    Non-medical: Not on file  Tobacco Use  . Smoking status: Former Smoker    Types: Cigars  . Smokeless tobacco: Never Used  Substance and Sexual Activity  . Alcohol use: No    Frequency: Never  . Drug use: No  . Sexual activity: Not on file  Lifestyle  . Physical activity:    Days per week: Not on file    Minutes per session: Not on file  . Stress: Not on file  Relationships  . Social connections:    Talks on phone: Not on file    Gets together: Not on file    Attends religious service: Not on file    Active member of club or organization: Not on file    Attends meetings of clubs or organizations: Not on file    Relationship status: Not on file  . Intimate partner violence:    Fear of current or ex partner: Not on file    Emotionally abused: Not on file    Physically abused: Not on file    Forced sexual activity: Not on file  Other Topics Concern  . Not on file  Social History Narrative  . Not on file    Whitney Lowe's family history includes Asthma in her father; COPD in her father; Crohn's disease in her mother; Diabetes Mellitus II in her father; Hypertension in her father.      Objective:    Vitals:   07/22/17 0943  BP: 106/64  Pulse: 80    Physical Exam; well-developed young African-American female in no acute distress, pleasant blood pressure 106/64 pulse 80, height 4 foot 10, weight 101, BMI 21.1.  HEENT; nontraumatic normocephalic EOMI PERRLA sclera anicteric, Cardiovascular ;regular rate and rhythm with S1-S2 no murmur rub or gallop, Pulmonary clear  bilaterally, Abdomen ;soft, bowel sounds are present she has some mild bilateral lower quadrant tenderness no guarding or rebound no palpable mass or hepatosplenomegaly, Rectal ;exam not done, Extremities; no clubbing cyanosis or edema skin warm and dry, Neuro psych ;mood and affect appropriate       Assessment & Plan:   #66 32 year old female with Crohn's ileocolitis Recent hospitalization with exacerbation despite Humira which was initiated September 2018 elsewhere. Patient was also C. difficile positive during hospitalization and has completed a 14-day course of vancomycin.  Currently asymptomatic She is currently improved on prednisone which is being gradually tapered. 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  Also she never achieved remission after 6 months of treatment.  Plan; CRP, QuantiFERON gold, TPMT enzyme levels Patient can stop adalimumab We will continue prednisone 20 mg p.o. every morning.  She was asked not to taper the lower until we can get her started on another biologic. We discussed need to switch to another biologic today.  Discussed Remicade and Cimzia.  Her mother actually has Crohn's disease as well ,and has done well on Cimzia.  Patient would prefer Cimzia if we can get that approved. I think she should also be started on an immunomodulator, Will await pending enzyme levels, then hopefully can start 6-MP. Start Levsin sublingual 1 p.o. every 6 hours as needed for abdominal pain/cramping Low gas diet Will discuss choice of biologic further with Dr. Hilarie Lowe, and this will need to be preauthorized. Patient has follow-up appointment with Dr. Hilarie Lowe in May.  She may need to be seen in the interim and I am happy to follow her.  Braiden Presutti S Eain Mullendore PA-C 07/22/2017   Cc: Glendale Chard, MD   Addendum: Reviewed and agree with management. I recommend that we begin her on Cimzia with standard induction I also recommend that we begin low-dose azathioprine for  combination therapy particularly in light of her Humira antibody development. It appears TPMT, though ordered, was not collected.  This needs to be done and resulted before we start immunomodulator therapy [employer], could you work on [employer] initiation and approval.  Pyrtle, Lajuan Lines, MD

## 2017-07-24 LAB — QUANTIFERON-TB GOLD PLUS
NIL: 0.02 [IU]/mL
QUANTIFERON-TB GOLD PLUS: NEGATIVE
TB1-NIL: 0 [IU]/mL
TB2-NIL: 0 [IU]/mL

## 2017-07-30 ENCOUNTER — Telehealth: Payer: Self-pay

## 2017-07-30 NOTE — Telephone Encounter (Signed)
Patient was contacted and we discussed that Dr. Hilarie Fredrickson recommends she begin Cimzia for her Crohn's  Disease.  Patient prefers to come into the office for injections of possible.  She is notified that I will initiate the benefits investigation with [employer].  She is aware that she may need to apply for the assistance program to help cover the costs of the drug.  She is aware that I will reach out to her once we get approval and discuss the next steps.  She understands that this may take a few weeks to work out.

## 2017-07-31 NOTE — Telephone Encounter (Signed)
Mason Jim, Amy L sent to Marlon Pel, RN        Cleophas Dunker,  We are indeed out of network. This is a [place] EPO plan. This type of plan requires patients to be seen in the state of [place] only. She does not have any out of network benefits. She would be responsible for all charges.  Per Alex @ [phone]  Thanks,  Amy    Patient notified of the insurance company response.  She asked that I cancel her upcoming appt with Dr. Hilarie Fredrickson.  She is advised that if she is able to obtain insurance in the state we are more than happy to see her and will look again into in office Cimzia injections.

## 2017-07-31 NOTE — Telephone Encounter (Signed)
Per [employer] patient has no in network benefits for Cimzia in office injection.  Patient is encouraged to contact her insurance company and make sure we are in- network for her.  She understands that I am waiting to hear back from [employer] to clarify and having our pre-cert coordinator check from our side as well.  She inquired about other options for obtaining Cimzia.  I advised her I will forward this on to Dr. Hilarie Fredrickson to decide next steps.

## 2017-08-03 NOTE — Telephone Encounter (Signed)
Pt to seek information regarding medical coverage within [place] I am happy to continue to care for her, but understand she is limited by her insurance company currently If she changes medical plans to include our group, she can be scheduled follow-up and we can proceed with Cimzia

## 2017-09-15 ENCOUNTER — Ambulatory Visit: Payer: Managed Care, Other (non HMO) | Admitting: Internal Medicine

## 2017-12-30 ENCOUNTER — Emergency Department (HOSPITAL_COMMUNITY): Payer: Managed Care, Other (non HMO)

## 2017-12-30 ENCOUNTER — Emergency Department (HOSPITAL_COMMUNITY)
Admission: EM | Admit: 2017-12-30 | Discharge: 2017-12-30 | Disposition: A | Discharge status: Home / Self Care | Payer: Managed Care, Other (non HMO) | Attending: Emergency Medicine | Admitting: Emergency Medicine

## 2017-12-30 ENCOUNTER — Other Ambulatory Visit: Payer: Self-pay

## 2017-12-30 ENCOUNTER — Encounter (HOSPITAL_COMMUNITY): Payer: Self-pay

## 2017-12-30 DIAGNOSIS — S0990XA Unspecified injury of head, initial encounter: Secondary | ICD-10-CM | POA: Diagnosis present

## 2017-12-30 DIAGNOSIS — Y998 Other external cause status: Secondary | ICD-10-CM | POA: Diagnosis not present

## 2017-12-30 DIAGNOSIS — W228XXA Striking against or struck by other objects, initial encounter: Secondary | ICD-10-CM | POA: Diagnosis not present

## 2017-12-30 DIAGNOSIS — Z79899 Other long term (current) drug therapy: Secondary | ICD-10-CM | POA: Diagnosis not present

## 2017-12-30 DIAGNOSIS — Y929 Unspecified place or not applicable: Secondary | ICD-10-CM | POA: Diagnosis not present

## 2017-12-30 DIAGNOSIS — Z87891 Personal history of nicotine dependence: Secondary | ICD-10-CM | POA: Insufficient documentation

## 2017-12-30 DIAGNOSIS — S060X0A Concussion without loss of consciousness, initial encounter: Secondary | ICD-10-CM

## 2017-12-30 DIAGNOSIS — Y939 Activity, unspecified: Secondary | ICD-10-CM | POA: Insufficient documentation

## 2017-12-30 MED ORDER — HYDROCODONE-ACETAMINOPHEN 5-325 MG PO TABS
1.0000 | ORAL_TABLET | ORAL | Status: AC
  Administered 2017-12-30: 1 via ORAL
  Filled 2017-12-30: qty 1

## 2017-12-30 MED ORDER — IBUPROFEN 600 MG PO TABS: 600.0000 mg | ORAL_TABLET | Freq: Three times a day (TID) | ORAL | 0 refills | Status: DC | PRN

## 2017-12-30 MED ORDER — ONDANSETRON 8 MG PO TBDP: 8.0000 mg | ORAL_TABLET | Freq: Three times a day (TID) | ORAL | 0 refills | Status: DC | PRN

## 2017-12-30 NOTE — ED Provider Notes (Signed)
[facility] DEPT Provider Note   CSN: [id number] Arrival date & time: 12/30/17  1601     History   Chief Complaint Chief Complaint  Patient presents with  . Head Injury    HPI Whitney Lowe is a 32 y.o. female.  HPI Patient presents to the emergency room for evaluation of head pain.  Patient accidentally hit her right temple region on a corner of furniture today.  Patient has tried taking over-the-counter medications but the pain is increasing.  Patient states she has severe pain on the right side of her head.  She denies any nausea vomiting.  No numbness or weakness.  SHe does have some pain in the left side of her neck but no midline pain. Past Medical History:  Diagnosis Date  . Crohn's colitis [facility])     Patient Active Problem List   Diagnosis Date Noted  . Hx of Clostridium difficile infection 07/22/2017  . Crohn disease ([place]) 06/27/2017  . Exacerbation of Crohn's disease ([place]) 06/26/2017  . Normochromic normocytic anemia 06/26/2017  . Crohn's colitis ([place]) 06/26/2017  . Malnutrition of moderate degree 06/26/2017    Past Surgical History:  Procedure Laterality Date  . COLONOSCOPY    . ESOPHAGOGASTRODUODENOSCOPY       OB History   None      Home Medications    Prior to Admission medications   Medication Sig Start Date End Date Taking? Authorizing Provider  Adalimumab (HUMIRA) 40 MG/0.4ML PSKT Inject 40 mg into the skin every 14 (fourteen) days.    [provider]  cholecalciferol (VITAMIN D) 1000 units tablet Take 1,000 Units by mouth daily.    [provider]  clonazePAM (KLONOPIN) 0.5 MG tablet Take 0.5 tablets (0.25 mg total) by mouth 2 (two) times daily as needed (anxiety). 06/28/17   Florencia Reasons, MD  Ferrous Sulfate (IRON) 28 MG TABS Take 1 tablet by mouth daily.     [provider]  hyoscyamine (LEVSIN SL) 0.125 MG SL tablet Place 1 tablet (0.125 mg total) under the tongue every 6 (six) hours as  needed for cramping (and gas.). 07/22/17   Esterwood, Amy S, PA-C  ibuprofen (ADVIL,MOTRIN) 600 MG tablet Take 1 tablet (600 mg total) by mouth every 8 (eight) hours as needed. 12/30/17   Dorie Rank, MD  Multiple Vitamin (MULTIVITAMIN WITH MINERALS) TABS tablet Take 1 tablet by mouth daily. 06/29/17   Florencia Reasons, MD  ondansetron (ZOFRAN ODT) 8 MG disintegrating tablet Take 1 tablet (8 mg total) by mouth every 8 (eight) hours as needed for nausea or vomiting. 12/30/17   Dorie Rank, MD  ondansetron (ZOFRAN) 4 MG tablet Take 1 tablet (4 mg total) by mouth every 8 (eight) hours as needed for nausea or vomiting. 06/28/17   Florencia Reasons, MD  oxyCODONE-acetaminophen (PERCOCET) 5-325 MG tablet Take 1 tablet by mouth every 8 (eight) hours as needed for severe pain. 06/28/17   Florencia Reasons, MD  predniSONE (DELTASONE) 10 MG tablet Take 2 tablets (20 mg total) by mouth daily with breakfast. 07/22/17   Esterwood, Amy S, PA-C  predniSONE (DELTASONE) 5 MG tablet Label  & dispense according to the schedule below.  7 Pills PO for 7 days then, 6 Pills PO for 7days, 5 Pills PO for 3 days, 4 Pills PO for 7 days, 3 Pills PO for 7 days, 2 Pills PO for 7 days, 1 Pill  PO for 7 days then STOP. 07/06/17   Florencia Reasons, MD  saccharomyces boulardii [facility])  250 MG capsule Take 1 capsule (250 mg total) by mouth 2 (two) times daily. 06/28/17   Florencia Reasons, MD  vitamin B-12 (CYANOCOBALAMIN) 1000 MCG tablet Take 1,000 mcg by mouth daily.    [provider]    Family History Family History  Problem Relation Age of Onset  . Crohn's disease Mother        mom was adopted  . Diabetes Mellitus II Father   . COPD Father   . Hypertension Father   . Asthma Father     Social History Social History   Tobacco Use  . Smoking status: Former Smoker    Types: Cigars  . Smokeless tobacco: Never Used  Substance Use Topics  . Alcohol use: No    Frequency: Never  . Drug use: No     Allergies   Patient has no known allergies.   Review of  Systems Review of Systems  All other systems reviewed and are negative.    Physical Exam Updated Vital Signs BP 105/76   Pulse 95   Temp 98.2 F (36.8 C) (Oral)   Resp 14   Ht 1.473 m (4' 10" )   Wt 44.5 kg   LMP 12/14/2017   SpO2 100%   BMI 20.48 kg/m   Physical Exam  Constitutional: She appears well-developed and well-nourished. No distress.  HENT:  Head: Normocephalic.  Right Ear: External ear normal.  Left Ear: External ear normal.  ttp right temple region, edema, no laceration  Eyes: Conjunctivae are normal. Right eye exhibits no discharge. Left eye exhibits no discharge. No scleral icterus.  Neck: Neck supple. No tracheal deviation present.  Cardiovascular: Normal rate, regular rhythm and intact distal pulses.  Pulmonary/Chest: Effort normal and breath sounds normal. No stridor. No respiratory distress. She has no wheezes. She has no rales.  Abdominal: Soft. Bowel sounds are normal. She exhibits no distension. There is no tenderness. There is no rebound and no guarding.  Musculoskeletal: She exhibits no edema or tenderness.       Cervical back: She exhibits no bony tenderness, no swelling, no edema and no deformity.  Neurological: She is alert. She has normal strength. No cranial nerve deficit (no facial droop, extraocular movements intact, no slurred speech) or sensory deficit. She exhibits normal muscle tone. She displays no seizure activity. Coordination normal.  Skin: Skin is warm and dry. No rash noted.  Psychiatric: She has a normal mood and affect.  Nursing note and vitals reviewed.    ED Treatments / Results  Labs (all labs ordered are listed, but only abnormal results are displayed) Labs Reviewed - No data to display  EKG None  Radiology Ct Head Wo Contrast  Result Date: 12/30/2017 CLINICAL DATA:  Hit head, headache and pain EXAM: CT HEAD WITHOUT CONTRAST TECHNIQUE: Contiguous axial images were obtained from the base of the skull through the vertex  without intravenous contrast. COMPARISON:  None. FINDINGS: Brain: No evidence of acute infarction, hemorrhage, hydrocephalus, extra-axial collection or mass lesion/mass effect. Vascular: No hyperdense vessel or unexpected calcification. Skull: Normal. Negative for fracture or focal lesion. Sinuses/Orbits: Mucosal thickening in the ethmoid and sphenoid sinuses. Other: None IMPRESSION: Negative non contrasted CT appearance of the brain Electronically Signed   By: Donavan Foil M.D.   On: 12/30/2017 17:48    Procedures Procedures (including critical care time)  Medications Ordered in ED Medications  HYDROcodone-acetaminophen (NORCO/VICODIN) 5-325 MG per tablet 1 tablet (1 tablet Oral Given 12/30/17 1719)     Initial Impression /  Assessment and Plan / ED Course  I have reviewed the triage vital signs and the nursing notes.  Pertinent labs & imaging results that were available during my care of the patient were reviewed by me and considered in my medical decision making (see chart for details).    No evidence of serious injury associated with the head insury.  Likely a contusion with some concussion symptoms.   Explained findings to patient and warning signs that should prompt return to the ED.  Final Clinical Impressions(s) / ED Diagnoses   Final diagnoses:  Injury of head, initial encounter  Concussion without loss of consciousness, initial encounter    ED Discharge Orders         Ordered    ibuprofen (ADVIL,MOTRIN) 600 MG tablet  Every 8 hours PRN     12/30/17 1814    ondansetron (ZOFRAN ODT) 8 MG disintegrating tablet  Every 8 hours PRN     12/30/17 1814           Dorie Rank, MD 12/30/17 1816

## 2017-12-30 NOTE — ED Notes (Signed)
Writer went into room to assess pt. Pt was upset with Probation officer as stated that we need to recheck vital signs prior to giving pain mediation. Pt states that she wanted the light off and writer states as soon as I can assess her I can comply. Pt  Then states that they wanted a new nurse. Called Probation officer a "bitch" and states that she has been waiting 45 minutes, and no one has came in. Writer explained to pt that I was on lunch and another nurse was covering. Charge Nurse Tim made aware.

## 2017-12-30 NOTE — ED Triage Notes (Signed)
Pt states she accidentally hit her right side of her head into her armoire. Pt states that she hit right by her temple and the pain has just gotten worse. Pt states she tried to take a nap but the pain has not gone down. Pt tearful in triage.

## 2017-12-30 NOTE — Discharge Instructions (Addendum)
Take the medications as needed for pain and nausea, apply ice to help with the pain and swelling, please review the discharge instructions for additional information

## 2018-07-05 ENCOUNTER — Encounter (HOSPITAL_COMMUNITY): Payer: Self-pay | Admitting: Emergency Medicine

## 2018-07-05 ENCOUNTER — Other Ambulatory Visit: Payer: Self-pay

## 2018-07-05 ENCOUNTER — Emergency Department (HOSPITAL_COMMUNITY)
Admission: EM | Admit: 2018-07-05 | Discharge: 2018-07-06 | Disposition: A | Discharge status: Home / Self Care | Payer: Medicaid Other | Attending: Emergency Medicine | Admitting: Emergency Medicine

## 2018-07-05 ENCOUNTER — Emergency Department (HOSPITAL_COMMUNITY): Payer: Medicaid Other

## 2018-07-05 DIAGNOSIS — R1011 Right upper quadrant pain: Secondary | ICD-10-CM | POA: Diagnosis not present

## 2018-07-05 DIAGNOSIS — Z87891 Personal history of nicotine dependence: Secondary | ICD-10-CM | POA: Diagnosis not present

## 2018-07-05 DIAGNOSIS — O2691 Pregnancy related conditions, unspecified, first trimester: Secondary | ICD-10-CM | POA: Diagnosis not present

## 2018-07-05 DIAGNOSIS — Z3A01 Less than 8 weeks gestation of pregnancy: Secondary | ICD-10-CM | POA: Insufficient documentation

## 2018-07-05 DIAGNOSIS — Z79899 Other long term (current) drug therapy: Secondary | ICD-10-CM | POA: Insufficient documentation

## 2018-07-05 DIAGNOSIS — O9989 Other specified diseases and conditions complicating pregnancy, childbirth and the puerperium: Secondary | ICD-10-CM | POA: Diagnosis not present

## 2018-07-05 DIAGNOSIS — R1013 Epigastric pain: Secondary | ICD-10-CM | POA: Diagnosis not present

## 2018-07-05 LAB — COMPREHENSIVE METABOLIC PANEL
ALBUMIN: 3.2 g/dL — AB (ref 3.5–5.0)
ALK PHOS: 58 U/L (ref 38–126)
ALT: 15 U/L (ref 0–44)
ANION GAP: 9 (ref 5–15)
AST: 19 U/L (ref 15–41)
BUN: 7 mg/dL (ref 6–20)
CHLORIDE: 101 mmol/L (ref 98–111)
CO2: 23 mmol/L (ref 22–32)
Calcium: 8.7 mg/dL — ABNORMAL LOW (ref 8.9–10.3)
Creatinine, Ser: 0.72 mg/dL (ref 0.44–1.00)
GFR calc Af Amer: >60 mL/min (ref >60)
GFR calc non Af Amer: >60 mL/min (ref >60)
Glucose, Bld: 98 mg/dL (ref 70–99)
POTASSIUM: 3.4 mmol/L — AB (ref 3.5–5.1)
Sodium: 133 mmol/L — ABNORMAL LOW (ref 135–145)
TOTAL PROTEIN: 8 g/dL (ref 6.5–8.1)
Total Bilirubin: 0.3 mg/dL (ref 0.3–1.2)

## 2018-07-05 LAB — URINALYSIS, ROUTINE W REFLEX MICROSCOPIC
BACTERIA UA: NONE SEEN
Bilirubin Urine: NEGATIVE
GLUCOSE, UA: NEGATIVE mg/dL
HGB URINE DIPSTICK: NEGATIVE
KETONES UR: 5 mg/dL — AB
NITRITE: NEGATIVE
PROTEIN: NEGATIVE mg/dL
Specific Gravity, Urine: 1.013 (ref 1.005–1.030)
pH: 6 (ref 5.0–8.0)

## 2018-07-05 LAB — CBC
HEMATOCRIT: 38.3 % (ref 36.0–46.0)
HEMOGLOBIN: 12.4 g/dL (ref 12.0–15.0)
MCH: 27.8 pg (ref 26.0–34.0)
MCHC: 32.4 g/dL (ref 30.0–36.0)
MCV: 85.9 fL (ref 80.0–100.0)
Platelets: 474 10*3/uL — ABNORMAL HIGH (ref 150–400)
RBC: 4.46 MIL/uL (ref 3.87–5.11)
RDW: 13.3 % (ref 11.5–15.5)
WBC: 7.4 10*3/uL (ref 4.0–10.5)
nRBC: 0 % (ref 0.0–0.2)

## 2018-07-05 LAB — LIPASE, BLOOD: Lipase: 31 U/L (ref 11–51)

## 2018-07-05 MED ORDER — SODIUM CHLORIDE 0.9% FLUSH
3.0000 mL | Freq: Once | INTRAVENOUS | Status: AC
  Administered 2018-07-05: 3 mL via INTRAVENOUS

## 2018-07-05 MED ORDER — ONDANSETRON HCL 4 MG/2ML IJ SOLN: 4.0000 mg | Freq: Once | INTRAMUSCULAR | Status: DC | PRN

## 2018-07-05 MED ORDER — ALUM & MAG HYDROXIDE-SIMETH 200-200-20 MG/5ML PO SUSP
30.0000 mL | Freq: Once | ORAL | Status: AC
  Administered 2018-07-05: 30 mL via ORAL
  Filled 2018-07-05: qty 30

## 2018-07-05 MED ORDER — FAMOTIDINE 20 MG PO TABS
20.0000 mg | ORAL_TABLET | Freq: Once | ORAL | Status: AC
  Administered 2018-07-05: 20 mg via ORAL
  Filled 2018-07-05: qty 1

## 2018-07-05 MED ORDER — SODIUM CHLORIDE 0.9 % IV BOLUS
1000.0000 mL | Freq: Once | INTRAVENOUS | Status: AC
  Administered 2018-07-05: 1000 mL via INTRAVENOUS

## 2018-07-05 MED ORDER — PROMETHAZINE HCL 25 MG/ML IJ SOLN
25.0000 mg | Freq: Once | INTRAMUSCULAR | Status: AC
  Administered 2018-07-05: 25 mg via INTRAVENOUS
  Filled 2018-07-05: qty 1

## 2018-07-05 NOTE — ED Triage Notes (Signed)
Patient is complaining of upper abdominal pain and diarrhea. Patient has not been eating for the past few days. Patient states that she is [redacted] weeks pregnant. Patient has a hx of chron's.

## 2018-07-05 NOTE — ED Provider Notes (Signed)
[facility] DEPT Provider Note   CSN: [id number] Arrival date & time: 07/05/18  2133    History   Chief Complaint Chief Complaint  Patient presents with  . Routine Prenatal Visit  . Diarrhea    HPI Whitney Lowe is a 33 y.o. female.     Patient with history of Crohn's disease on Stelara every 6 weeks presenting with upper abdominal pain as well as anorexia for the past 5 days.  States she has had basically no appetite and persistent nausea but no vomiting.  She is not wanting to eat or drink.  Her bowel movements have been normal and not diarrhea and nonbloody.  Contrary to triage note there is no diarrhea.  Patient reports she is 6 weeks, 4 days pregnant with her first pregnancy and had an ultrasound today that was reassuring and showed an IUP.  Denies any vaginal bleeding, lower abdominal pain, pain with urination or blood in the urine. She has a GI doctor in [place] that she has not seen for quite some time.  She does not have an OB doctor yet.  She does have a history of acid reflux and GERD but is no longer on any of those medications. The pain is sharp and constant and worse with trying to eat or drink.  There has been no vomiting or diarrhea.  No chest pain or shortness of breath. No previous abdominal surgeries  The history is provided by the patient.  Diarrhea  Associated symptoms: abdominal pain   Associated symptoms: no arthralgias, no fever, no headaches, no myalgias and no vomiting     Past Medical History:  Diagnosis Date  . Crohn's colitis [facility])     Patient Active Problem List   Diagnosis Date Noted  . Hx of Clostridium difficile infection 07/22/2017  . Crohn disease ([place]) 06/27/2017  . Exacerbation of Crohn's disease ([place]) 06/26/2017  . Normochromic normocytic anemia 06/26/2017  . Crohn's colitis ([place]) 06/26/2017  . Malnutrition of moderate degree 06/26/2017    Past Surgical History:  Procedure Laterality Date  .  COLONOSCOPY    . ESOPHAGOGASTRODUODENOSCOPY       OB History    Gravida  1   Para      Term      Preterm      AB      Living        SAB      TAB      Ectopic      Multiple      Live Births               Home Medications    Prior to Admission medications   Medication Sig Start Date End Date Taking? Authorizing Provider  Adalimumab (HUMIRA) 40 MG/0.4ML PSKT Inject 40 mg into the skin every 14 (fourteen) days.    [provider]  cholecalciferol (VITAMIN D) 1000 units tablet Take 1,000 Units by mouth daily.    [provider]  clonazePAM (KLONOPIN) 0.5 MG tablet Take 0.5 tablets (0.25 mg total) by mouth 2 (two) times daily as needed (anxiety). 06/28/17   Florencia Reasons, MD  Ferrous Sulfate (IRON) 28 MG TABS Take 1 tablet by mouth daily.     [provider]  hyoscyamine (LEVSIN SL) 0.125 MG SL tablet Place 1 tablet (0.125 mg total) under the tongue every 6 (six) hours as needed for cramping (and gas.). 07/22/17   Esterwood, Amy S, PA-C  ibuprofen (ADVIL,MOTRIN) 600 MG tablet Take  1 tablet (600 mg total) by mouth every 8 (eight) hours as needed. 12/30/17   Dorie Rank, MD  Multiple Vitamin (MULTIVITAMIN WITH MINERALS) TABS tablet Take 1 tablet by mouth daily. 06/29/17   Florencia Reasons, MD  ondansetron (ZOFRAN ODT) 8 MG disintegrating tablet Take 1 tablet (8 mg total) by mouth every 8 (eight) hours as needed for nausea or vomiting. 12/30/17   Dorie Rank, MD  ondansetron (ZOFRAN) 4 MG tablet Take 1 tablet (4 mg total) by mouth every 8 (eight) hours as needed for nausea or vomiting. 06/28/17   Florencia Reasons, MD  oxyCODONE-acetaminophen (PERCOCET) 5-325 MG tablet Take 1 tablet by mouth every 8 (eight) hours as needed for severe pain. 06/28/17   Florencia Reasons, MD  predniSONE (DELTASONE) 10 MG tablet Take 2 tablets (20 mg total) by mouth daily with breakfast. 07/22/17   Esterwood, Amy S, PA-C  predniSONE (DELTASONE) 5 MG tablet Label  & dispense according to the schedule below.  7  Pills PO for 7 days then, 6 Pills PO for 7days, 5 Pills PO for 3 days, 4 Pills PO for 7 days, 3 Pills PO for 7 days, 2 Pills PO for 7 days, 1 Pill  PO for 7 days then STOP. 07/06/17   Florencia Reasons, MD  saccharomyces boulardii (FLORASTOR) 250 MG capsule Take 1 capsule (250 mg total) by mouth 2 (two) times daily. 06/28/17   Florencia Reasons, MD  vitamin B-12 (CYANOCOBALAMIN) 1000 MCG tablet Take 1,000 mcg by mouth daily.    [provider]    Family History Family History  Problem Relation Age of Onset  . Crohn's disease Mother        mom was adopted  . Diabetes Mellitus II Father   . COPD Father   . Hypertension Father   . Asthma Father     Social History Social History   Tobacco Use  . Smoking status: Former Smoker    Types: Cigars  . Smokeless tobacco: Never Used  Substance Use Topics  . Alcohol use: No    Frequency: Never  . Drug use: No     Allergies   Patient has no known allergies.   Review of Systems Review of Systems  Constitutional: Positive for activity change and fatigue. Negative for fever.  HENT: Negative for congestion and rhinorrhea.   Respiratory: Negative for cough, chest tightness and shortness of breath.   Cardiovascular: Negative for chest pain.  Gastrointestinal: Positive for abdominal pain and nausea. Negative for constipation, diarrhea and vomiting.  Genitourinary: Negative for dysuria, hematuria, vaginal bleeding and vaginal discharge.  Musculoskeletal: Negative for arthralgias, back pain and myalgias.  Skin: Negative for rash.  Neurological: Negative for dizziness, weakness and headaches.   all other systems are negative except as noted in the HPI and PMH.    Physical Exam Updated Vital Signs BP 102/67 (BP Location: Left Arm)   Pulse (!) 114   Temp 99.1 F (37.3 C) (Oral)   Resp (!) 24   Ht 4' 10"  (1.473 m)   Wt 44.5 kg   LMP 12/14/2017   SpO2 99%   BMI 20.48 kg/m   Physical Exam Vitals signs and nursing note reviewed.   Constitutional:      General: She is not in acute distress.    Appearance: She is well-developed.  HENT:     Head: Normocephalic and atraumatic.     Mouth/Throat:     Pharynx: No oropharyngeal exudate.  Eyes:     Conjunctiva/sclera: Conjunctivae normal.  Pupils: Pupils are equal, round, and reactive to light.  Neck:     Musculoskeletal: Normal range of motion and neck supple.     Comments: No meningismus. Cardiovascular:     Rate and Rhythm: Regular rhythm. Tachycardia present.     Heart sounds: Normal heart sounds. No murmur.  Pulmonary:     Effort: Pulmonary effort is normal. No respiratory distress.     Breath sounds: Normal breath sounds.  Abdominal:     Palpations: Abdomen is soft.     Tenderness: There is abdominal tenderness. There is no guarding or rebound.     Comments: Epigastric tenderness with voluntary guarding No right upper quadrant tenderness No lower abdominal tenderness  Musculoskeletal: Normal range of motion.        General: No tenderness.     Comments: No CVA tenderness  Skin:    General: Skin is warm.     Capillary Refill: Capillary refill takes less than 2 seconds.  Neurological:     General: No focal deficit present.     Mental Status: She is alert and oriented to person, place, and time. Mental status is at baseline.     Cranial Nerves: No cranial nerve deficit.     Motor: No abnormal muscle tone.     Coordination: Coordination normal.     Comments: No ataxia on finger to nose bilaterally. No pronator drift. 5/5 strength throughout. CN 2-12 intact.Equal grip strength. Sensation intact.   Psychiatric:        Behavior: Behavior normal.      ED Treatments / Results  Labs (all labs ordered are listed, but only abnormal results are displayed) Labs Reviewed  COMPREHENSIVE METABOLIC PANEL - Abnormal; Notable for the following components:      Result Value   Sodium 133 (*)    Potassium 3.4 (*)    Calcium 8.7 (*)    Albumin 3.2 (*)    All  other components within normal limits  CBC - Abnormal; Notable for the following components:   Platelets 474 (*)    All other components within normal limits  URINALYSIS, ROUTINE W REFLEX MICROSCOPIC - Abnormal; Notable for the following components:   Ketones, ur 5 (*)    Leukocytes,Ua TRACE (*)    All other components within normal limits  HCG, QUANTITATIVE, PREGNANCY - Abnormal; Notable for the following components:   hCG, Beta Chain, Quant, S 100,789 (*)    All other components within normal limits  LIPASE, BLOOD  LACTIC ACID, PLASMA    EKG None  Radiology US Abdomen Limited Ruq  Result Date: 07/06/2018 CLINICAL DATA:  Right upper quadrant pain EXAM: ULTRASOUND ABDOMEN LIMITED RIGHT UPPER QUADRANT COMPARISON:  CT 06/26/2017 FINDINGS: Gallbladder: No gallstones or wall thickening visualized. No sonographic Murphy sign noted by sonographer. Common bile duct: Diameter: Normal caliber, 4 mm Liver: No focal lesion identified. Within normal limits in parenchymal echogenicity. Portal vein is patent on color Doppler imaging with normal direction of blood flow towards the liver. IMPRESSION: Normal right upper quadrant ultrasound. Electronically Signed   By: Rolm Baptise M.D.   On: 07/06/2018 00:22    Procedures Procedures (including critical care time)  Medications Ordered in ED Medications  ondansetron (ZOFRAN) injection 4 mg (has no administration in time range)  sodium chloride flush (NS) 0.9 % injection 3 mL (3 mLs Intravenous Given 07/05/18 2314)  sodium chloride 0.9 % bolus 1,000 mL (1,000 mLs Intravenous New Bag/Given 07/05/18 2324)  promethazine (PHENERGAN) injection 25 mg (25 mg  Intravenous Given 07/05/18 2326)  alum & mag hydroxide-simeth (MAALOX/MYLANTA) 200-200-20 MG/5ML suspension 30 mL (30 mLs Oral Given 07/05/18 2325)  famotidine (PEPCID) tablet 20 mg (20 mg Oral Given 07/05/18 2325)     Initial Impression / Assessment and Plan / ED Course  I have reviewed the triage vital  signs and the nursing notes.  Pertinent labs & imaging results that were available during my care of the patient were reviewed by me and considered in my medical decision making (see chart for details).       Patient [redacted] weeks pregnant with confirmed IUP presenting with upper abdominal pain, nausea and anorexia for the past 5 days.  No vomiting or diarrhea.  No fever.  IUP confirmed on outside [place] today. Patient has these images with her.   Patient hydrated and labs obtained.  Her LFTs and lipase are normal.  Her white blood cell count is normal.  She has mild tachycardia and borderline blood pressure.  Right upper quadrant ultrasound is negative for gallstones. Patient's pain is improved with p.o. Pepcid and Maalox.  She agrees this pain does not feel like a Crohn's exacerbation and she does not have any diarrhea. She states her ultrasound of her pregnancy earlier today was normal and showed an IUP.  Patient hydrated with a second liter of fluid because her blood pressure remains soft in the 80s but heart rate did improve to around 100.  Patient states her pain is improved and she would like to defer any other abdominal imaging in the setting of her pregnancy. Abdomen is soft without peritoneal signs.  No lower abdominal tenderness.  Patient tolerating PO and ambulatory. Remains mildly tachycardic after 3L IVF but BP has improved. Lactate normal.  Will start zantac and carafate.  Advised her to followup with her GI doctor to confirm which Crohn's meds are safe for pregnancy. Also followup with her OB doctor and start prenatal vitamins.  Return precautions discussed including intractable nausea and vomiting, worsening abdominal pain, vaginal bleeding, or any other concerns.   Final Clinical Impressions(s) / ED Diagnoses   Final diagnoses:  RUQ pain  Epigastric pain  Less than [redacted] weeks gestation of pregnancy    ED Discharge Orders    None       Okley Magnussen, Annie Main, MD 07/06/18  0930

## 2018-07-06 ENCOUNTER — Other Ambulatory Visit: Payer: Self-pay

## 2018-07-06 ENCOUNTER — Inpatient Hospital Stay (HOSPITAL_COMMUNITY)
Admission: AD | Admit: 2018-07-06 | Discharge: 2018-07-07 | Disposition: A | Discharge status: Home / Self Care | Payer: Medicaid Other | Source: Home / Self Care | Attending: Emergency Medicine | Admitting: Emergency Medicine

## 2018-07-06 DIAGNOSIS — Z3A01 Less than 8 weeks gestation of pregnancy: Secondary | ICD-10-CM | POA: Insufficient documentation

## 2018-07-06 DIAGNOSIS — K5090 Crohn's disease, unspecified, without complications: Secondary | ICD-10-CM | POA: Insufficient documentation

## 2018-07-06 DIAGNOSIS — Z833 Family history of diabetes mellitus: Secondary | ICD-10-CM

## 2018-07-06 DIAGNOSIS — Z87891 Personal history of nicotine dependence: Secondary | ICD-10-CM

## 2018-07-06 DIAGNOSIS — O99341 Other mental disorders complicating pregnancy, first trimester: Secondary | ICD-10-CM | POA: Insufficient documentation

## 2018-07-06 DIAGNOSIS — O99011 Anemia complicating pregnancy, first trimester: Secondary | ICD-10-CM

## 2018-07-06 DIAGNOSIS — Z79899 Other long term (current) drug therapy: Secondary | ICD-10-CM

## 2018-07-06 DIAGNOSIS — F4323 Adjustment disorder with mixed anxiety and depressed mood: Secondary | ICD-10-CM

## 2018-07-06 LAB — CBC WITH DIFFERENTIAL/PLATELET
Abs Immature Granulocytes: 0.03 10*3/uL (ref 0.00–0.07)
BASOS PCT: 1 %
Basophils Absolute: 0 10*3/uL (ref 0.0–0.1)
EOS ABS: 0.1 10*3/uL (ref 0.0–0.5)
Eosinophils Relative: 1 %
HCT: 34.5 % — ABNORMAL LOW (ref 36.0–46.0)
Hemoglobin: 11.5 g/dL — ABNORMAL LOW (ref 12.0–15.0)
Immature Granulocytes: 1 %
Lymphocytes Relative: 41 %
Lymphs Abs: 2.5 10*3/uL (ref 0.7–4.0)
MCH: 27.9 pg (ref 26.0–34.0)
MCHC: 33.3 g/dL (ref 30.0–36.0)
MCV: 83.7 fL (ref 80.0–100.0)
Monocytes Absolute: 0.8 10*3/uL (ref 0.1–1.0)
Monocytes Relative: 12 %
Neutro Abs: 2.8 10*3/uL (ref 1.7–7.7)
Neutrophils Relative %: 44 %
Platelets: 449 10*3/uL — ABNORMAL HIGH (ref 150–400)
RBC: 4.12 MIL/uL (ref 3.87–5.11)
RDW: 13.3 % (ref 11.5–15.5)
WBC: 6.2 10*3/uL (ref 4.0–10.5)
nRBC: 0 % (ref 0.0–0.2)

## 2018-07-06 LAB — LACTIC ACID, PLASMA: Lactic Acid, Venous: 0.6 mmol/L (ref 0.5–1.9)

## 2018-07-06 LAB — HCG, QUANTITATIVE, PREGNANCY: hCG, Beta Chain, Quant, S: 100789 m[IU]/mL — ABNORMAL HIGH (ref <5)

## 2018-07-06 MED ORDER — SODIUM CHLORIDE 0.9 % IV BOLUS
1000.0000 mL | Freq: Once | INTRAVENOUS | Status: AC
  Administered 2018-07-06: 1000 mL via INTRAVENOUS

## 2018-07-06 MED ORDER — LIDOCAINE VISCOUS HCL 2 % MT SOLN
15.0000 mL | Freq: Once | OROMUCOSAL | Status: AC
Start: 2018-07-06 — End: 2018-07-06
  Administered 2018-07-06: 15 mL via OROMUCOSAL
  Filled 2018-07-06: qty 15

## 2018-07-06 MED ORDER — SUCRALFATE 1 G PO TABS: 1.0000 g | ORAL_TABLET | Freq: Three times a day (TID) | ORAL | 0 refills | Status: DC

## 2018-07-06 MED ORDER — RANITIDINE HCL 150 MG PO TABS: 150.0000 mg | ORAL_TABLET | Freq: Two times a day (BID) | ORAL | 0 refills | Status: DC
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
## 2018-07-06 NOTE — ED Triage Notes (Signed)
Per family pt has not been responding. On arrival pt crying, and needed several attempts of coercing to get into bed.

## 2018-07-06 NOTE — MAU Note (Signed)
CALLED ER CHARGE  RN- CALEY - GAVE REPORT

## 2018-07-06 NOTE — ED Provider Notes (Signed)
[facility] EMERGENCY DEPARTMENT Provider Note   CSN: [id number] Arrival date & time: 07/06/18  2214    History   Chief Complaint Chief Complaint  Patient presents with  . Depression    LEVEL 5 CAVEAT 2/2 PSYCHIATRIC ILLNESS  HPI Whitney Lowe is a 33 y.o. female.    Whitney Lowe is a 33 y.o. G3P0020 (1 elective abortion at age 23; 31 SAB) currently pregnant at 45w5dby LMP with a history of Crohn's disease, depression [facility] and [facility]onia.  Per family, patient has not been responding since this afternoon.  [facility] and [facility] concerned about acute psychiatric illness.  They transported her to the ED for evaluation.  Patient reserved, obviously depressed and tearful.  She did open up to nursing and state that she has been off of her Stelara and Wellbutrin.  She feels that she is overwhelmingly tired and wants to sleep and not wake up.  She does not endorse suicidal ideations, but expresses that she does not know why "God won't just take me".  Seen yesterday in the ED for Crohn's flare; discharged in stable condition.  History provided by: nursing staff, family. No language interpreter was used.  Depression     Past Medical History:  Diagnosis Date  . [facility]'s colitis ([facility]     Patient Active Problem List   Diagnosis Date Noted  . Hx of Clostridium difficile infection 07/22/2017  . [facility] ([facility] 06/27/[place]  . Exacerbation of [place]'s disease ([facility] 06/26/2017  . Normochromic normocytic anemia 06/26/2017  . Crohn's colitis ([facility] 06/26/2017  . Malnutrition of moderate degree 06/26/2017    Past Surgical History:  Procedure Laterality Date  . COLONOSCOPY    . ESOPHAGOGASTRODUODENOSCOPY       OB History    Gravida  1   Para      Term      Preterm      AB      Living        SAB      TAB      Ectopic      Multiple      Live Births               Home Medications    Prior to Admission medications   Medication Sig Start Date End Date Taking? Authorizing Provider  clonazePAM (KLONOPIN) 0.5 MG tablet Take 0.5 tablets (0.25 mg total) by mouth 2 (two) times daily as needed (anxiety). Patient not taking: Reported on 07/06/2018 06/28/17   XFlorencia Reasons MD  hyoscyamine (LEVSIN SL) 0.125 MG SL tablet Place 1 tablet (0.125 mg total) under the tongue every 6 (six) hours as needed for cramping (and gas.). Patient not taking: Reported on 07/06/2018 07/22/17   Esterwood, Amy S, PA-C  ibuprofen (ADVIL,MOTRIN) 600 MG tablet Take 1 tablet (600 mg total) by mouth every 8 (eight) hours as needed. Patient not taking: Reported on 07/06/2018 12/30/17   KDorie Rank MD  Multiple Vitamin (MULTIVITAMIN WITH MINERALS) TABS tablet Take 1 tablet by mouth daily. Patient not taking: Reported on 07/06/2018 06/29/17   XFlorencia Reasons MD  ondansetron (ZOFRAN ODT) 8 MG disintegrating tablet Take 1 tablet (8 mg total) by mouth every 8 (eight) hours as needed for nausea or vomiting. Patient not taking: Reported on 07/06/2018 12/30/17   KDorie Rank MD  ondansetron (ZOFRAN) 4 MG tablet Take 1 tablet (4 mg total) by mouth every 8 (eight) hours as needed for nausea or vomiting. Patient not taking: Reported on 07/06/2018 06/28/17   Florencia Reasons, MD  oxyCODONE-acetaminophen (PERCOCET) 5-325 MG tablet Take 1 tablet by mouth every 8 (eight) hours as needed for severe pain. Patient not taking: Reported on 07/06/2018 06/28/17   Florencia Reasons, MD  predniSONE (DELTASONE) 10 MG tablet Take 2 tablets (20 mg total) by mouth daily with breakfast. Patient not taking: Reported on 07/06/2018 07/22/17   Esterwood, Amy S, PA-C  predniSONE (DELTASONE) 5 MG tablet Label  & dispense according to the schedule below.  7 Pills PO for 7 days then, 6 Pills PO for 7days, 5 Pills PO for 3 days, 4 Pills PO for 7 days, 3 Pills PO for 7  days, 2 Pills PO for 7 days, 1 Pill  PO for 7 days then STOP. Patient not taking: Reported on 07/06/2018 07/06/17   Florencia Reasons, MD  Prenatal MV-Min-FA-Omega-3 (PRENATAL GUMMIES/DHA & FA PO) Take 2 each by mouth daily.    [provider]  ranitidine (ZANTAC) 150 MG tablet Take 1 tablet (150 mg total) by mouth 2 (two) times daily. 07/06/18   Rancour, Annie Main, MD  saccharomyces boulardii (FLORASTOR) 250 MG capsule Take 1 capsule (250 mg total) by mouth 2 (two) times daily. Patient not taking: Reported on 07/06/2018 06/28/17   Florencia Reasons, MD  sucralfate (CARAFATE) 1 g tablet Take 1 tablet (1 g total) by mouth 4 (four) times daily -  with meals and at bedtime. 07/06/18   Rancour, Annie Main, MD  Ustekinumab (STELARA [place]) Inject 1 each into the skin every 30 (thirty) days.    [provider]    Family History Family History  Problem Relation Age of Onset  . Crohn's disease Mother        mom was adopted  . Diabetes Mellitus II Father   . COPD Father   . Hypertension Father   . Asthma Father     Social History Social History   Tobacco Use  . Smoking status: Former Smoker    Types: Cigars  . Smokeless tobacco: Never Used  Substance Use Topics  . Alcohol use: No    Frequency: Never  . Drug use: No     Allergies   Patient has no known allergies.   Review of Systems Review of Systems  Unable to perform ROS: Patient nonverbal  Psychiatric/Behavioral: Positive for depression.  PATIENT WILL NOT CONTRIBUTE TO HISTORY   Physical Exam Updated Vital Signs BP 99/67   Pulse (!) 115   Temp 99.4 F (37.4 C) (Oral)   Resp 15   LMP 12/14/2017   SpO2 99%   Physical Exam Vitals signs and nursing note reviewed.  Constitutional:      General: She is not in acute distress.    Appearance: She is well-developed. She is not diaphoretic.     Comments: Nontoxic appearing. Thin/frail appearing. Tearful.  HENT:     Head: Normocephalic and atraumatic.  Eyes:     General: No scleral  icterus.    Conjunctiva/sclera: Conjunctivae normal.  Neck:     Musculoskeletal: Normal range of motion.  Cardiovascular:     Rate and Rhythm: Regular rhythm. Tachycardia present.     Pulses: Normal pulses.     Comments: Mild tachycardia Pulmonary:     Effort: Pulmonary effort is normal. No respiratory distress.     Comments: Respirations even and unlabored Musculoskeletal:  Normal range of motion.  Skin:    General: Skin is warm and dry.     Coloration: Skin is not pale.     Findings: No erythema or rash.  Neurological:     Mental Status: She is alert and oriented to person, place, and time.  Psychiatric:        Mood and Affect: Mood is depressed. Affect is tearful.        Speech: She is noncommunicative.        Behavior: Behavior is withdrawn.      ED Treatments / Results  Labs (all labs ordered are listed, but only abnormal results are displayed) Labs Reviewed  HCG, QUANTITATIVE, PREGNANCY - Abnormal; Notable for the following components:      Result Value   hCG, Beta Chain, Quant, S 95,433 (*)    All other components within normal limits  CBC WITH DIFFERENTIAL/PLATELET - Abnormal; Notable for the following components:   Hemoglobin 11.5 (*)    HCT 34.5 (*)    Platelets 449 (*)    All other components within normal limits  COMPREHENSIVE METABOLIC PANEL - Abnormal; Notable for the following components:   Sodium 133 (*)    CO2 19 (*)    BUN <5 (*)    Calcium 8.6 (*)    Albumin 2.4 (*)    Total Bilirubin 0.2 (*)    All other components within normal limits  RAPID URINE DRUG SCREEN, HOSP PERFORMED - Abnormal; Notable for the following components:   Tetrahydrocannabinol POSITIVE (*)    All other components within normal limits  ACETAMINOPHEN LEVEL - Abnormal; Notable for the following components:   Acetaminophen (Tylenol), Serum <10 (*)    All other components within normal limits  ETHANOL  SALICYLATE LEVEL    EKG None  Radiology US Abdomen Limited Ruq   Result Date: 07/06/2018 CLINICAL DATA:  Right upper quadrant pain EXAM: ULTRASOUND ABDOMEN LIMITED RIGHT UPPER QUADRANT COMPARISON:  CT 06/26/2017 FINDINGS: Gallbladder: No gallstones or wall thickening visualized. No sonographic Murphy sign noted by sonographer. Common bile duct: Diameter: Normal caliber, 4 mm Liver: No focal lesion identified. Within normal limits in parenchymal echogenicity. Portal vein is patent on color Doppler imaging with normal direction of blood flow towards the liver. IMPRESSION: Normal right upper quadrant ultrasound. Electronically Signed   By: Rolm Baptise M.D.   On: 07/06/2018 00:22    Procedures Procedures (including critical care time)  Medications Ordered in ED Medications  sodium chloride 0.9 % bolus 1,000 mL (has no administration in time range)    3:00 AM RN spoke with TTS who reports patient stable for d/c and outpatient f/u.   Initial Impression / Assessment and Plan / ED Course  I have reviewed the triage vital signs and the nursing notes.  Pertinent labs & imaging results that were available during my care of the patient were reviewed by me and considered in my medical decision making (see chart for details).        33 year old female presents to the emergency department for psychiatric evaluation after family was concerned when finding the patient at home in a very depressed state.  She would not verbally respond to family and they needed to dress her and carry her to the car to bring her to the ED for evaluation.  After having a longer discussion with the patient further into her ED stay, she states that she feels excessively tired and overwhelmed with recent changes in her life.  She would  function better with the structure of school, but is no longer in school or taking classes.  She is also trying to plan a wedding while recently pregnant.  References being emailed spreadsheets by a family member and feeling unable to process them or make any  decisions with regard to the wedding.  The patient would previously use marijuana to manage her anxiety, but she has discontinued use of this since becoming pregnant.  She has also been off her Wellbutrin which was used for management of racing thoughts.  Patient expressing overwhelming apathy with anorexia.  Overwhelmingly tearful throughout much of her ED visit.  The patient was evaluated by TTS who believes she is stable for discharge at this time.  I have encouraged the patient to follow-up with an outpatient therapist or psychiatrist and to return to the ED if her symptoms worsen.  Her symptoms are consistent with acute adjustment disorder with mixed anxious and depressed mood.  I have reiterated to the patient that, in order for her to have a successful pregnancy, she must continue to care for herself to the best of her ability.  She verbalizes understanding.  Discharged from the department in stable condition.    Final Clinical Impressions(s) / ED Diagnoses   Final diagnoses:  Adjustment disorder with mixed anxiety and depressed mood    ED Discharge Orders    None       Antonietta Breach, PA-C 07/07/18 8102    Duffy Bruce, MD 07/07/18 [phone]

## 2018-07-06 NOTE — ED Notes (Signed)
Patient requested that I come in the room to speak; pt was crying hysterically saying "He just won't take me, He won't take me..I am so tired, I just want to go to sleep". Patient denies SI, because she is "too scared" to take her own life. Patient continues to cry hysterically. Provider notified.

## 2018-07-06 NOTE — MAU Note (Signed)
BOYFRIEND- MARCUS - SAYS PT WAS AT [facility] LAST NIGHT- FOR VOMITING.    TONIGHT   THEY HAD ARGUMENT - AND NOW PT WON'T TALK OR ANSWER QUESTIONS OR MOVE- SITTING IN W/C .      PT IS BREATHING AND  TEARS  STREAM DOWN HER FACE .

## 2018-07-06 NOTE — Discharge Instructions (Signed)
Your testing shows a normal gallbladder.  Your liver function and pancreas function are normal.  We suspect your abdominal pain is due to acid reflux or gastritis.  You should take stomach medicine as prescribed.  Avoid alcohol, caffeine, NSAIDs and spicy foods.  Follow-up with your Crohn's doctor to discuss what medications are safe during pregnancy.  You should also follow-up with your OB doctor for further evaluation of your pregnancy.  Return to the ED with worsening symptoms including worsening abdominal pain, not able to eat or drink, vaginal bleeding or any other concerns.

## 2018-07-06 NOTE — ED Notes (Signed)
Patient ambulated to the restroom

## 2018-07-06 NOTE — ED Notes (Signed)
Pt given water for fluid challenge.

## 2018-07-07 ENCOUNTER — Telehealth: Payer: Self-pay | Admitting: Obstetrics & Gynecology

## 2018-07-07 ENCOUNTER — Other Ambulatory Visit: Payer: Self-pay

## 2018-07-07 LAB — COMPREHENSIVE METABOLIC PANEL
ALBUMIN: 2.4 g/dL — AB (ref 3.5–5.0)
ALT: 13 U/L (ref 0–44)
AST: 20 U/L (ref 15–41)
Alkaline Phosphatase: 51 U/L (ref 38–126)
Anion gap: 7 (ref 5–15)
BUN: <5 mg/dL — ABNORMAL LOW (ref 6–20)
CO2: 19 mmol/L — ABNORMAL LOW (ref 22–32)
Calcium: 8.6 mg/dL — ABNORMAL LOW (ref 8.9–10.3)
Chloride: 107 mmol/L (ref 98–111)
Creatinine, Ser: 0.59 mg/dL (ref 0.44–1.00)
GFR calc Af Amer: >60 mL/min (ref >60)
GFR calc non Af Amer: >60 mL/min (ref >60)
GLUCOSE: 94 mg/dL (ref 70–99)
Potassium: 3.5 mmol/L (ref 3.5–5.1)
Sodium: 133 mmol/L — ABNORMAL LOW (ref 135–145)
Total Bilirubin: 0.2 mg/dL — ABNORMAL LOW (ref 0.3–1.2)
Total Protein: 6.7 g/dL (ref 6.5–8.1)

## 2018-07-07 LAB — SALICYLATE LEVEL: Salicylate Lvl: <7 mg/dL (ref 2.8–30.0)

## 2018-07-07 LAB — HCG, QUANTITATIVE, PREGNANCY: hCG, Beta Chain, Quant, S: 95433 m[IU]/mL — ABNORMAL HIGH (ref <5)

## 2018-07-07 LAB — RAPID URINE DRUG SCREEN, HOSP PERFORMED
Amphetamines: NOT DETECTED
BENZODIAZEPINES: NOT DETECTED
Barbiturates: NOT DETECTED
Cocaine: NOT DETECTED
Opiates: NOT DETECTED
Tetrahydrocannabinol: POSITIVE — AB

## 2018-07-07 LAB — ACETAMINOPHEN LEVEL: Acetaminophen (Tylenol), Serum: <10 ug/mL — ABNORMAL LOW (ref 10–30)

## 2018-07-07 LAB — ETHANOL: Alcohol, Ethyl (B): <10 mg/dL (ref <10)

## 2018-07-07 MED ORDER — SODIUM CHLORIDE 0.9 % IV BOLUS: 1000.0000 mL | Freq: Once | INTRAVENOUS | Status: DC

## 2018-07-07 NOTE — Telephone Encounter (Signed)
The patient called to make an appointment with an OBGYN as she is currently pregnancy. Confirmation of pregnancy received at ER. Informed the patient of the intake process and the date and time of appointment.

## 2018-07-07 NOTE — ED Notes (Signed)
Per behavioral health; pt to be d/c'd

## 2018-07-07 NOTE — ED Notes (Signed)
Patient refused IV/fluids. Provider notified.

## 2018-07-07 NOTE — Discharge Instructions (Signed)
We recommend close follow-up with a therapist or psychiatrist regarding your symptoms.  Return to the emergency department if your symptoms persist or worsen.
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
## 2018-07-07 NOTE — ED Notes (Signed)
Patient was wheeled to the bathroom and assisted to toilet. Pt is able to stand with assistance - moderately weak.

## 2018-07-15 ENCOUNTER — Telehealth: Payer: Self-pay | Admitting: Obstetrics and Gynecology

## 2018-07-15 NOTE — Telephone Encounter (Signed)
Called the patient to inform of change in appointment date and time. No option to leave a voicemail as the patient's phone number is not accepting message. Sending an appointment reminder.

## 2018-07-21 ENCOUNTER — Other Ambulatory Visit: Payer: Self-pay

## 2018-07-21 ENCOUNTER — Encounter: Payer: Self-pay | Admitting: Internal Medicine

## 2018-07-21 ENCOUNTER — Ambulatory Visit (INDEPENDENT_AMBULATORY_CARE_PROVIDER_SITE_OTHER): Payer: Self-pay | Admitting: Internal Medicine

## 2018-07-21 ENCOUNTER — Telehealth: Payer: Self-pay | Admitting: Internal Medicine

## 2018-07-21 ENCOUNTER — Telehealth: Payer: Self-pay | Admitting: Family Medicine

## 2018-07-21 DIAGNOSIS — Z7960 Long term (current) use of unspecified immunomodulators and immunosuppressants: Secondary | ICD-10-CM | POA: Insufficient documentation

## 2018-07-21 DIAGNOSIS — Z3A08 8 weeks gestation of pregnancy: Secondary | ICD-10-CM

## 2018-07-21 DIAGNOSIS — Z79899 Other long term (current) drug therapy: Secondary | ICD-10-CM

## 2018-07-21 DIAGNOSIS — K5080 Crohn's disease of both small and large intestine without complications: Secondary | ICD-10-CM

## 2018-07-21 DIAGNOSIS — Z3490 Encounter for supervision of normal pregnancy, unspecified, unspecified trimester: Secondary | ICD-10-CM

## 2018-07-21 MED ORDER — USTEKINUMAB 90 MG/ML ~~LOC~~ SOSY: 90.0000 mg | PREFILLED_SYRINGE | SUBCUTANEOUS | 0 refills | Status: DC

## 2018-07-21 NOTE — Assessment & Plan Note (Signed)
She seems to be doing well and I think it makes sense to continue her Stelara and to keep her Crohn's disease under control.  We cannot guarantee that there are not risks to the fetus but the balance of evidence to date suggest controlling the Crohn's disease makes more sense than stopping the medications.  I did refill her Stelara x1 today.  She will need to establish a follow-up with Dr. Hilarie Fredrickson who saw her before so in our system will be her primary gastroenterologist.  We need to request records from Dr. Roanna Epley at the [place] [facility] [place] for the office visits procedures labs pathology and imaging from 2018 through 2020.

## 2018-07-21 NOTE — Progress Notes (Signed)
TELEHEALTH ENCOUNTER IN SETTING OF COVID-19 PANDEMIC - REQUESTED BY PATIENT SERVICE PROVIDED BY TELEMEDECINE - TYPE WebEx PATIENT LOCATION: Home PATIENT HAS CONSENTED TO TELEHEALTH VISIT PROVIDER LOCATION: OFFICE TIME SPENT ON VISIT/CALL: 25 minutes    Whitney Lowe 33 y.o. February 07, 1986 [id number]  Assessment & Plan:   Crohn's ileocolitis (Whitney Lowe) She seems to be doing well and I think it makes sense to continue her Stelara and to keep her Crohn's disease under control.  We cannot guarantee that there are not risks to the fetus but the balance of evidence to date suggest controlling the Crohn's disease makes more sense than stopping the medications.  I did refill her Stelara x1 today.  She will need to establish a follow-up with Dr. Hilarie Fredrickson who saw her before so in our system will be her primary gastroenterologist.  We need to request records from Dr. Roanna Epley at the [place] [facility] [place] for the office visits procedures labs pathology and imaging from 2018 through 2020.  Long-term use of immunosuppressant medication-Stelara Need to see when she last had a QuantiFERON test and may need that repeated soon.  Pregnancy She has a pending obstetrician appointment.     Subjective:   Chief Complaint:  HPI The patient is a 33 year old African-American woman seen via telehealth at her request because she needs to establish GI care, and she wants to know about continuing Stelara in the setting of a pregnancy.  She is [redacted] weeks pregnant for the first time and that is going well, she has not yet established obstetrics care but is to see [facility] [place] she says her Crohn's disease is under good control without diarrhea or abdominal pain.  She had moved here and was seen in the hospital by our team and Dr. Hilarie Fredrickson last year when she had diarrhea most likely from C. difficile.  She had elevated Humira antibodies, and was then changed to Stelara by Dr. Karlton Lemon at the  [facility] [place].  She has been traveling there for her care but would like to establish care here, previously her insurance was out of state and she could not see [place] but now we are an eligible provider for her.  Review of the records indicates she was diagnosed in March 2018, she had colitis by colonoscopy with biopsy but apparently had ileocolitis by CT scan in March 2019 here.  She was never able to come off prednisone without recurrent symptoms after she started Humira and antibodies were done and they were elevated as mentioned above.  She has done well on Stelara.  She is on 90 mcg every 8 weeks.  She was in the emergency department on March 16 with some anorexia which has been a problem, slight nausea some mild epigastric discomfort.  Triage note said diarrhea but she told the provider no.  She is been struggling with worsening ADHD symptoms and was seen the next day on the 17th and thought to have an adjustment disorder.  She has been stressed being pregnant, she apparently is off Wellbutrin, recently tried on Abilify the other day by a provider and said that made her nauseous, despite THC positive in the urine said she had stopped marijuana which was helping to manage her anxiety.  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  That said she says her Crohn's disease is not bothering her.  Labs  in the computer confirmed pregnancy test positive, albumin 3.2 calcium slightly low along with that potassium is 3.4 LFTs normal sodium was 133 platelets 474 CBC otherwise normal, ethanol level normal i.e. negative.  She had a normal right upper quadrant ultrasound No Known Allergies No outpatient medications have been marked as taking for the 07/20/32 encounter (Office Visit) with Gatha Mayer, MD.   Past Medical History:  Diagnosis Date  . ADHD (attention deficit hyperactivity disorder)   .  Crohn's colitis ([place])   . Crohn's ileocolitis ([place]) 06/19/2016   Past Surgical History:  Procedure Laterality Date  . COLONOSCOPY    . ESOPHAGOGASTRODUODENOSCOPY     Social History   Social History Narrative   Lives w/ fiance   Pregnant    BS psychology   [employer]   family history includes Asthma in her father; COPD in her father; Crohn's disease in her mother; Diabetes Mellitus II in her father; Hypertension in her father.   Review of Systems

## 2018-07-21 NOTE — Assessment & Plan Note (Signed)
She has a pending obstetrician appointment.

## 2018-07-21 NOTE — Telephone Encounter (Signed)
The patient called in to discuss the reason of why the appointment was changed. Stated she upset because she was not aware. Informed the patient of the phone call and the methods we are using for virtual visits. The patient updated the phone number and agreed to attend the appointment.

## 2018-07-21 NOTE — Patient Instructions (Signed)
As we discussed it makes sense to continue Stelara.  We will request your records from [place] and review those.  Prior to your next Stelara due date if you have not heard from [place] please contact us so we may refill that.  When you are on biologic medications like Stelara you need an annual tuberculosis blood test or skin test.  We use a quantiferon blood test and depending upon when your last one was in [place] we will need to schedule another 1 of those.  Please call us back with any questions in the interim.  Though I saw you today Dr. Hilarie Fredrickson will be your primary gastroenterologist here [facility] gastroenterology since he met you first back in 2019.  I know you will like him.  Gatha Mayer, MD, [facility]

## 2018-07-21 NOTE — Telephone Encounter (Signed)
Refill sent to pharmacy for 1 syringe.

## 2018-07-21 NOTE — Telephone Encounter (Signed)
Pt has Crohn's disease and requested Stelara prescription to be sent to [facility] on [address].  Scheduled WebEx with Dr. Carlean Purl today

## 2018-07-21 NOTE — Assessment & Plan Note (Signed)
Need to see when she last had a QuantiFERON test and may need that repeated soon.

## 2018-07-26 ENCOUNTER — Telehealth: Payer: Self-pay | Admitting: Internal Medicine

## 2018-07-26 NOTE — Telephone Encounter (Signed)
Pt called to inform that Stelara requires a PA.  Pt's injection is due today.

## 2018-07-26 NOTE — Telephone Encounter (Signed)
Prior Josem Kaufmann was initiated and is pending #[id number] 07-26-18 ref # [id number] I left the patient a message that Josem Kaufmann will be 24-48 hours before Medicaid will give a determination.

## 2018-07-28 NOTE — Telephone Encounter (Signed)
Pt called to follow on PA.

## 2018-07-28 NOTE — Telephone Encounter (Signed)
PA still pending.  I left a message for the patient that we hope to have an answer tomorrow and I will call if we have an answer

## 2018-07-29 NOTE — Telephone Encounter (Signed)
Stelara approved 07/26/18-07/21/2019.  Auth # [id number] I left a message for the patient that it is approved and she should be able to pick up Reference call from [facility] for today's call was # [id number]

## 2018-08-03 ENCOUNTER — Other Ambulatory Visit: Payer: Self-pay | Admitting: Internal Medicine

## 2018-08-15 ENCOUNTER — Inpatient Hospital Stay (HOSPITAL_COMMUNITY)
Admission: AD | Admit: 2018-08-15 | Discharge: 2018-08-15 | Disposition: A | Discharge status: Home / Self Care | Payer: Medicaid Other | Attending: Obstetrics & Gynecology | Admitting: Obstetrics & Gynecology

## 2018-08-15 ENCOUNTER — Encounter (HOSPITAL_COMMUNITY): Payer: Self-pay | Admitting: *Deleted

## 2018-08-15 ENCOUNTER — Inpatient Hospital Stay (HOSPITAL_COMMUNITY): Payer: Medicaid Other

## 2018-08-15 ENCOUNTER — Other Ambulatory Visit: Payer: Self-pay

## 2018-08-15 DIAGNOSIS — D259 Leiomyoma of uterus, unspecified: Secondary | ICD-10-CM | POA: Diagnosis not present

## 2018-08-15 DIAGNOSIS — Z3A12 12 weeks gestation of pregnancy: Secondary | ICD-10-CM

## 2018-08-15 DIAGNOSIS — O99611 Diseases of the digestive system complicating pregnancy, first trimester: Secondary | ICD-10-CM | POA: Insufficient documentation

## 2018-08-15 DIAGNOSIS — R1903 Right lower quadrant abdominal swelling, mass and lump: Secondary | ICD-10-CM | POA: Diagnosis not present

## 2018-08-15 DIAGNOSIS — K5080 Crohn's disease of both small and large intestine without complications: Secondary | ICD-10-CM | POA: Insufficient documentation

## 2018-08-15 DIAGNOSIS — Z3A13 13 weeks gestation of pregnancy: Secondary | ICD-10-CM | POA: Insufficient documentation

## 2018-08-15 DIAGNOSIS — R109 Unspecified abdominal pain: Secondary | ICD-10-CM | POA: Insufficient documentation

## 2018-08-15 DIAGNOSIS — O3411 Maternal care for benign tumor of corpus uteri, first trimester: Secondary | ICD-10-CM

## 2018-08-15 DIAGNOSIS — O26891 Other specified pregnancy related conditions, first trimester: Secondary | ICD-10-CM | POA: Diagnosis not present

## 2018-08-15 DIAGNOSIS — O3481 Maternal care for other abnormalities of pelvic organs, first trimester: Secondary | ICD-10-CM | POA: Diagnosis not present

## 2018-08-15 LAB — URINALYSIS, ROUTINE W REFLEX MICROSCOPIC
Bilirubin Urine: NEGATIVE
Glucose, UA: NEGATIVE mg/dL
Hgb urine dipstick: NEGATIVE
Ketones, ur: 5 mg/dL — AB
Leukocytes,Ua: NEGATIVE
Nitrite: NEGATIVE
Protein, ur: NEGATIVE mg/dL
Specific Gravity, Urine: 1.029 (ref 1.005–1.030)
pH: 6 (ref 5.0–8.0)

## 2018-08-15 MED ORDER — ACETAMINOPHEN 500 MG PO TABS
1000.0000 mg | ORAL_TABLET | Freq: Once | ORAL | Status: AC
  Administered 2018-08-15: 1000 mg via ORAL
  Filled 2018-08-15: qty 2

## 2018-08-15 NOTE — MAU Provider Note (Signed)
History     CSN: [id number]  Arrival date and time: 08/15/18 1328   First Provider Initiated Contact with Patient 08/15/18 1421      Chief Complaint  Patient presents with  . Abdominal Pain   HPI  Whitney Lowe 33 y.o. 33w3dComes to MAU today with abdominal pain for several days.  Has not been seen yet for the pregnancy.  Has been seen by her GI MD and is on injections for Crohn's - MD stated it was fine to continue in pregnancy.  Had one episode of bleeding in the tub and does not know if it was rectal or vaginal bleeding.  Has noticed a lump in the RLQ with is tender to touch.  Also is having midline lower abdominal pain.  Client reports abdomen was distended earlier this week but is less distended now but is now having pain.  OB History    Gravida  2   Para      Term      Preterm      AB  1   Living        SAB  1   TAB      Ectopic      Multiple      Live Births              Past Medical History:  Diagnosis Date  . ADHD (attention deficit hyperactivity disorder)   . Crohn's ileocolitis ([place] 06/19/2016    Past Surgical History:  Procedure Laterality Date  . COLONOSCOPY    . ESOPHAGOGASTRODUODENOSCOPY      Family History  Problem Relation Age of Onset  . Crohn's disease Mother        mom was adopted  . Diabetes Mellitus II Father   . COPD Father   . Hypertension Father   . Asthma Father     Social History   Tobacco Use  . Smoking status: Former Smoker    Types: Cigars  . Smokeless tobacco: Never Used  Substance Use Topics  . Alcohol use: No    Frequency: Never  . Drug use: No    Allergies: No Known Allergies  Medications Prior to Admission  Medication Sig Dispense Refill Last Dose  . Prenatal MV-Min-FA-Omega-3 (PRENATAL GUMMIES/DHA & FA PO) Take 2 each by mouth daily.   Past Week at Unknown time  . STELARA 90 MG/ML SOSY injection ADMINISTER 1 ML(90 MG) UNDER THE SKIN EVERY 8 WEEKS 1 mL 2   . Ustekinumab (STELARA [place]) Inject 1 each  into the skin every 30 (thirty) days.   Past Month at Unknown time    Review of Systems  Constitutional: Negative for fever.  Respiratory: Negative for cough.   Gastrointestinal: Positive for abdominal distention and abdominal pain. Negative for nausea and vomiting.  Genitourinary: Negative for dysuria and vaginal discharge.   Physical Exam   Pulse (!) 113, temperature 98.6 F (37 C), temperature source Oral, resp. rate 18, height 4' 10"  (1.473 m), weight 42.3 kg, last menstrual period 12/14/2017, SpO2 96 %.  Physical Exam  Nursing note and vitals reviewed. Constitutional: She is oriented to person, place, and time. She appears well-developed and well-nourished.  HENT:  Head: Normocephalic.  Eyes: EOM are normal.  Neck: Neck supple.  GI: Soft. There is abdominal tenderness. There is guarding. There is no rebound.  Firm mass, tender to touch palpated in RLQ.  Fundus palpated at 3 FB below the umbilicus.  Musculoskeletal: Normal range of motion.  Neurological:  She is alert and oriented to person, place, and time.  Skin: Skin is warm and dry.  Psychiatric: She has a normal mood and affect.    MAU Course  Procedures Results for orders placed or performed during the hospital encounter of 08/15/18 (from the past 24 hour(s))  Urinalysis, Routine w reflex microscopic     Status: Abnormal   Collection Time: 08/15/18  2:00 PM  Result Value Ref Range   Color, Urine YELLOW YELLOW   APPearance CLEAR CLEAR   Specific Gravity, Urine 1.029 1.005 - 1.030   pH 6.0 5.0 - 8.0   Glucose, UA NEGATIVE NEGATIVE mg/dL   Hgb urine dipstick NEGATIVE NEGATIVE   Bilirubin Urine NEGATIVE NEGATIVE   Ketones, ur 5 (A) NEGATIVE mg/dL   Protein, ur NEGATIVE NEGATIVE mg/dL   Nitrite NEGATIVE NEGATIVE   Leukocytes,Ua NEGATIVE NEGATIVE    MDM Multiple fibroids seen on ultrasound.  Client has an appointment on 08-17-18 to begin prenatal care.  Consult with Dr. Elonda Husky re: plan of care.  Client has not used  any Tylenol for the pregnancy and reviewed that Tylenol is the choice for pain relief now.  Advised Tylenol around the clock by the package directions as needed for pain.  Please discuss on your visit on April 28 how you are doing with pain management with the Tylenol. Client is not having any bleeding or leaking of fluid, so will defer pelvic today.  Assessment and Plan  Pregnancy at 74w1dwith EDC 02-19-19 by ultrasound today Multiple Fibroids  Plan Drink at least 8 8-oz glasses of water every day. Take Tylenol 325 mg 2 tablets by mouth every 4 hours if needed for pain. Keep appointments to start prenatal care. Return if having vaginal bleeding or leaking of fluid.  Whitney Lowe Whitney Lowe Whitney Lowe 08/15/2018, 2:22 PM

## 2018-08-15 NOTE — Discharge Instructions (Signed)
Drink at least 8 8-oz glasses of water every day. Take Tylenol 325 mg 2 tablets by mouth every 4 hours if needed for pain. Keep your appointment to begin prenatal care.

## 2018-08-15 NOTE — MAU Note (Signed)
Pt reports lower right abd. Pain since Tuesday.  Reports taking maalox at 1145 this morning.  Pt reports small BM today. Pt denies vag bleeding.  Pt reports hx of fibroids and is tender to palpation on lower right abd.

## 2018-08-16 ENCOUNTER — Telehealth: Payer: Self-pay | Admitting: Lactation Services

## 2018-08-16 NOTE — Telephone Encounter (Signed)
Pt called in to ask if her appt tomorrow is virtual or on person. Called and informed Whitney Lowe that her appt in Virtual and for her to to available at her appt time tomorrow.   Pt reports she has a new cell phone # and it is [phone].

## 2018-08-17 ENCOUNTER — Other Ambulatory Visit: Payer: Self-pay

## 2018-08-17 ENCOUNTER — Ambulatory Visit (INDEPENDENT_AMBULATORY_CARE_PROVIDER_SITE_OTHER): Payer: Medicaid Other | Admitting: *Deleted

## 2018-08-17 DIAGNOSIS — O0990 Supervision of high risk pregnancy, unspecified, unspecified trimester: Secondary | ICD-10-CM | POA: Insufficient documentation

## 2018-08-17 DIAGNOSIS — Z79899 Other long term (current) drug therapy: Secondary | ICD-10-CM

## 2018-08-17 DIAGNOSIS — K5080 Crohn's disease of both small and large intestine without complications: Secondary | ICD-10-CM

## 2018-08-17 DIAGNOSIS — Z7960 Long term (current) use of unspecified immunomodulators and immunosuppressants: Secondary | ICD-10-CM

## 2018-08-17 NOTE — Progress Notes (Signed)
I connected with  Whitney Lowe on 08/17/18 at  1:15 PM EDT by Webex and verified that I am speaking with the correct person using two identifiers.   I discussed the limitations, risks, security and privacy concerns of performing an evaluation and management service by telephone and the availability of in person appointments. I also discussed with the patient that there may be a patient responsible charge related to this service. The patient expressed understanding and agreed to proceed.  New Ob intake completed. Pt was recently married 4 days ago and is excited about the pregnancy. She agreed to register for MyChart and Babyscripts. She will record weekly BP readings as requested. Pt was advised that her prenatal care will be a combination of face to face visits in office as well as Webex virtual visits. She will require early 2hr GTT due to family history of Diabetes. Pt states that she had pap in December 2019 @ [facility] and was normal. Initial office appt on 5/14 @ 1455. Pt voiced understanding of all information and instructions given.   Tomma Ehinger, Ronnell Freshwater, RN 08/17/2018  4:53 PM

## 2018-08-18 NOTE — Progress Notes (Signed)
I have reviewed the chart and agree with nursing staff's documentation of this patient's encounter.  [name], MD 08/18/2018 9:40 AM

## 2018-08-26 ENCOUNTER — Telehealth (INDEPENDENT_AMBULATORY_CARE_PROVIDER_SITE_OTHER): Payer: Medicaid Other

## 2018-08-26 ENCOUNTER — Telehealth: Payer: Self-pay

## 2018-08-26 DIAGNOSIS — O0990 Supervision of high risk pregnancy, unspecified, unspecified trimester: Secondary | ICD-10-CM

## 2018-08-26 NOTE — Telephone Encounter (Signed)
Called pt in regards to her painful fibroids.  Pt stated that she has two lumps that are protruding through her belly in the lower abd and she is having excruciating pain that Tylenol is not helping.  Per Dr. Elly Modena, we can't recommend prescribing something stronger for her pain without pt getting evaluated.  I informed pt that we recommend her to go to MAU because she is pregnant with this pain and we want to make sure that there is nothing going on with pregnancy and that is also the reason why the provider does not want to prescribe something stronger for pain .  Pt stated that she will going to MAU today.  MAU charge Ginger notified.

## 2018-08-30 ENCOUNTER — Telehealth: Payer: Self-pay | Admitting: Obstetrics & Gynecology

## 2018-08-30 NOTE — Telephone Encounter (Signed)
Called the patient to confirm the appointment, the patient verbalized understating.

## 2018-08-30 NOTE — Telephone Encounter (Signed)
Tylenol 1000 mg 4 times per day, up to but not to exceed 4000 mg in any 24 hour period should be okay I would not expect this to worsen Crohn's.  NSAIDs can worsen bowel inflammation and IBD. I need to see records from her most recent GI office visits and procedures, which I believe were in [place], [place].  She saw Dr. Carlean Purl recently by virtual visit and these records were supposedly requested If bleeding continues I need to see her either by virtual visit or in person

## 2018-09-02 ENCOUNTER — Encounter: Payer: Self-pay | Admitting: Obstetrics and Gynecology

## 2018-09-02 ENCOUNTER — Telehealth: Payer: Self-pay | Admitting: Obstetrics and Gynecology

## 2018-09-02 ENCOUNTER — Ambulatory Visit (INDEPENDENT_AMBULATORY_CARE_PROVIDER_SITE_OTHER): Payer: Medicaid Other | Admitting: Obstetrics and Gynecology

## 2018-09-02 ENCOUNTER — Other Ambulatory Visit: Payer: Self-pay

## 2018-09-02 DIAGNOSIS — O99019 Anemia complicating pregnancy, unspecified trimester: Secondary | ICD-10-CM

## 2018-09-02 DIAGNOSIS — O0990 Supervision of high risk pregnancy, unspecified, unspecified trimester: Secondary | ICD-10-CM | POA: Diagnosis not present

## 2018-09-02 DIAGNOSIS — O99012 Anemia complicating pregnancy, second trimester: Secondary | ICD-10-CM

## 2018-09-02 DIAGNOSIS — Z283 Underimmunization status: Secondary | ICD-10-CM | POA: Diagnosis not present

## 2018-09-02 DIAGNOSIS — O0992 Supervision of high risk pregnancy, unspecified, second trimester: Secondary | ICD-10-CM

## 2018-09-02 DIAGNOSIS — O9989 Other specified diseases and conditions complicating pregnancy, childbirth and the puerperium: Secondary | ICD-10-CM

## 2018-09-02 DIAGNOSIS — O09899 Supervision of other high risk pregnancies, unspecified trimester: Secondary | ICD-10-CM

## 2018-09-02 DIAGNOSIS — Z3A14 14 weeks gestation of pregnancy: Secondary | ICD-10-CM

## 2018-09-02 LAB — POCT URINALYSIS DIP (DEVICE)
Bilirubin Urine: NEGATIVE
Glucose, UA: NEGATIVE mg/dL
Hgb urine dipstick: NEGATIVE
Ketones, ur: NEGATIVE mg/dL
Leukocytes,Ua: NEGATIVE
Nitrite: NEGATIVE
Protein, ur: NEGATIVE mg/dL
Specific Gravity, Urine: 1.025 (ref 1.005–1.030)
Urobilinogen, UA: 0.2 mg/dL (ref 0.0–1.0)
pH: 6 (ref 5.0–8.0)

## 2018-09-02 NOTE — Progress Notes (Signed)
Subjective:    Whitney Lowe is a [id number] 72w6dbeing seen today for her first obstetrical visit.  Her obstetrical history is significant for first pregnancy with Chron's disease on immunosuppressant. Patient also has fibroid uterus. Patient does intend to breast feed. Pregnancy history fully reviewed.  Patient reports no complaints.  Vitals:   09/02/18 1533  BP: 92/61  Pulse: (!) 112  Temp: 98.4 F (36.9 C)  Weight: 98 lb 8 oz (44.7 kg)    HISTORY: OB History  Gravida Para Term Preterm AB Living  3       2    SAB TAB Ectopic Multiple Live Births  1 1          # Outcome Date GA Lbr Len/2nd Weight Sex Delivery Anes PTL Lv  3 Current           2 SAB 11/16/17          1 TAB 08/15/99           Past Medical History:  Diagnosis Date  . ADHD (attention deficit hyperactivity disorder)   . Cataract    Rt eye  . [place] 06[place]  . Fibroids    Past Surgical History:  Procedure Laterality Date  . COLONOSCOPY    . ESOPHAGOGASTRODUODENOSCOPY    . removal of cyst in ear     6th grade   Family History  Problem Relation Age of Onset  . Crohn's disease Mother        mom was adopted  . Heart disease Mother   . Anxiety disorder Mother   . Blindness Mother        Rt eye  . Diabetes Mellitus II Father   . COPD Father   . Hypertension Father   . Asthma Father   . ADD / ADHD Father   . Diabetes Father        type 2  . Anxiety disorder Father   . Depression Father   . Diabetes Sister        Type 2     Exam    Uterus:     Pelvic Exam:    Perineum: No Hemorrhoids, Normal Perineum   Vulva: normal   Vagina:  normal mucosa, normal discharge   pH:    Cervix: nulliparous appearance and cervix is closed and long   Adnexa: no mass, fullness, tenderness and palpable firboid on right side of lower uterus   Bony Pelvis: gynecoid  System: Breast:  normal appearance, no masses or tenderness   Skin: normal coloration and turgor, no rashes    Neurologic:  oriented, no focal deficits   Extremities: normal strength, tone, and muscle mass   HEENT extra ocular movement intact   Mouth/Teeth mucous membranes moist, pharynx normal without lesions and dental hygiene good   Neck supple and no masses   Cardiovascular: regular rate and rhythm   Respiratory:  appears well, vitals normal, no respiratory distress, acyanotic, normal RR, chest clear, no wheezing, crepitations, rhonchi, normal symmetric air entry   Abdomen: soft, non-tender; bowel sounds normal; no masses,  no organomegaly   Urinary:       Assessment:    Pregnancy: [id number] Active Problem List   Diagnosis Date Noted  . Supervision of high risk pregnancy, antepartum 08/17/2018  . Long-term use of immunosuppressant medication-Stelara 07/21/2018  . Pregnancy 07/21/2018  . [place]        Plan:     Initial labs drawn. Prenatal vitamins.  Problem list reviewed and updated. Genetic Screening discussed : panorama ordered.  Ultrasound discussed; fetal survey: ordered.  Follow up in 4 weeks. 50% of 30 min visit spent on counseling and coordination of care.     Josafat Enrico 09/02/2018

## 2018-09-02 NOTE — Patient Instructions (Addendum)
AREA PEDIATRIC/FAMILY PRACTICE PHYSICIANS  [place] [phone]) . [facility] Davy Pique, MD; Gwendlyn Deutscher, MD; Walker Kehr, MD; Andria Frames, MD; McDiarmid, MD; Dutch Quint, MD; Nori Riis, MD; Mingo Amber, [place]., [place], [place] [address] o [phone] o Mon-Fri 8:30-12:30, 1:30-5:00 o Providers come to see babies at [facility] o Accepting Medicaid . [place] [facility] [place] providers who accept newborns: Dorthy Cooler, MD; Orland Mustard, MD; Stephanie Acre, MD o [place], Rena Lara, [place] [address] o [phone] o Mon-Fri 8:00-5:30 o Babies seen by providers at [facility] o Does NOT accept Medicaid o Please call early in hospitalization for appointment (limited availability)  . Mustard [place], MD o [address], [place], [place] [address] o [phone] o Mon, Tue, Thur, Fri 8:30-5:00, Wed 10:00-7:00 (closed 1-2pm) o Babies seen by [facility] providers o Accepting Medicaid . [place], MD o [place], [place], [place] [address] o [phone] o Mon-Fri 8:30-5:00, Sat 8:30-12:00 o Provider comes to see babies at [place] Medicaid o Must have been referred from current patients or contacted office prior to delivery . [place] [facility] ([place] for [facility]) Franne Forts, MD; Tamera Punt, MD; Doneen Poisson, MD; Fatima Sanger, MD; Wynetta Emery, MD; Jess Barters, MD; Tami Ribas, MD; Herbert Moors, MD; Derrell Lolling, MD; Dorothyann Peng, MD; Lucious Groves, NP; Baldo Ash, NP o [address], [place], [place] [address] o [phone] o Mon, Tue, Thur, Fri 8:30-5:30, Wed 9:30-5:30, Sat 8:30-12:30 o Babies seen by [facility] providers o Accepting Medicaid o Only accepting infants of first-time parents or siblings of current patients [facility] discharge coordinator will make follow-up appointment . Baltazar Najjar o [place] [address],  [place], [place]  [address] o [phone]   Fax - [phone] . [facility] o [address], [place], [place]  [address] o Phone - [phone]   Fax [phone] . [place], [place], [place], [place]  [address] o [phone]  [place]ort[place] [phone]) . [facility] Reginal Lutes, MD; Jacklynn Ganong, MD; Torrie Mayers, MD; MD; Rosana Hoes, MD; Servando Salina, MD; Rose Fillers, MD; Rex Kras, MD; Corinna Capra, MD; Volney American, MD; Trilby Drummer, MD; Janann Colonel, MD; Jimmye [place], [place], [place], [place] [address] o [phone] o Mon-Fri 8:30-5:00 (extended evenings Mon-Thur as needed), Sat-Sun 10:00-1:00 o Providers come to see babies at [place] Medicaid for families of first-time babies and families with all children in the household age 72 and under. Must register with office prior to making appointment (M-F only). . [place], NP; Tomi Bamberger, MD; Redmond School, MD; [place], [place] [address] [place], [place] [address] o [phone] o Mon-Fri 8:00-5:00 o Babies seen by providers at [facility] o Does NOT accept Medicaid/Commercial Insurance Only . [facility])  Marnee Guarneri, MD; Drema Dallas, MD; Montine Circle, MD; Vilma Prader, MD; Vanita Panda, MD; Alfonso Ramus, MD; Ruthann Cancer, MD; Roxanne Mins, MD; Rosalva Ferron, MD; Polly Cobia, MD o [address] [place], [place] [address] o [phone] o Mon-Fri 8:30-5:30, Sat (Oct.-Mar.) 9:00-1:00 o Babies seen by providers at [place] [phone]) . [facility] Peers, MD; Suzan Slick, MD o [address], [place], [place] [address] o [phone] o Mon-Fri 8:30-5:00, Sat 8:30-12:00 o Providers come to see babies at [facility] o Does NOT accept Medicaid . [facility] Ricci Barker, PA; Mannie Stabile, MD; [place], [place]; Nancy Fetter, MD; Moreen Fowler, [place],  [place], [place] [address] o [phone] o Mon-Fri 8:00-5:00 o Babies seen by providers at [facility] o Does NOT accept Medicaid o Only accepting babies of parents who are patients o Please call early in hospitalization for appointment (limited availability) . [facility] Pediatricians Blanca Friend, MD; Sharlene Motts, MD; Rod Can, MD; Warner Mccreedy, NP; Sabra Heck, MD; Ermalinda Memos, MD; Sharlett Iles, NP; Aurther Loft, MD; Jerrye Beavers, MD; Marcello Moores, MD; Berline Lopes, MD; Charolette Forward, MD o [address] [place], [place], [place] [address] o [phone] o Mon-Fri 8:00-5:00, Sat 9:00-12:00 o Providers come to see babies at [facility] o Does NOT accept [facility] [phone]) . [place] [facility] [place] providers accepting new patients: Dayna Ramus, NP; [place], [place], [place], [place] [address] o [phone] o Mon-Fri 8:00-5:00 o Babies seen by providers at [facility] o Does NOT accept Medicaid o Only accepting babies of parents who are patients o Please call early in hospitalization for appointment (limited availability) . [facility] Oswaldo Conroy, MD; Sheran Lawless, MD o [address]., [place], [place] [address] o [phone] (press 1 to schedule appointment) o Mon-Fri 8:00-5:00 o Providers come to see babies at [facility] o Does NOT accept Medicaid . [facility] Jodi Mourning, MD o [address], [place], [place] [address] o [phone] o Mon-Fri 8:30-5:00 (lunch 12:30-1:00), extended hours by appointment only Wed 5:00-6:30 o Babies seen by [facility] providers o Accepting Medicaid . [place] at Evalyn Casco, MD; [place], MD; Ethlyn Gallery, MD o [place], [place], [place] [address] o [phone] o Mon-Fri 8:00-5:00 o Babies seen by [facility] providers o Does NOT accept Medicaid . Therapist, music at [place], MD; Yong Channel, MD; [place], [place] [address] [place], [place]  [address] o [phone] o Mon-Fri 8:00-5:00 o Babies seen by [facility] providers o Does NOT accept Medicaid . [place], [place]; [place], [place]; [place], NP; Albertina Parr, MD; Frederic Jericho, MD; Ronney Lion, MD; Carlos Levering, NP; Jerelene Redden, NP; Tomasita Crumble, NP; Ronelle Nigh, NP; Corinna Lines, MD; [place], MD o [address]., [place], [place] [address] o [phone] o Mon-Fri 8:30-5:00, Sat 10:00-1:00 o Providers come to see babies at [facility] o Does NOT accept Medicaid o Free prenatal information session Tuesdays at 4:45pm . [facility] Porfirio Oar, MD; [place], [place]; [place], [place]; [place], [place]., [place] [address] o [phone] o Mon-Fri 7:30-5:30 o Babies seen by [facility] providers . [facility] o [address] [place], [place], [place]  [address] o [phone]   Fax - [phone]  [place] [phone] & [phone]) . [place], MD o [address] [place], [place] [address] o [phone] o Mon-Thur 8:00-6:00 o Providers come to see babies at [place] Medicaid . [place], NP; Melford Aase, MD; [place], [place]; [place], [place]., [place], [place] [address] o [phone] o Mon-Thur 7:30-7:30, Fri 7:30-4:30 o Babies seen by [facility] providers o Accepting Medicaid . [facility] Nyra Jabs, MD; Cristino Martes, NP; Gertie Baron, MD o [place] [address], [place],  [address] o [phone] o Mon-Fri 8:30-5:00, Sat 8:30-12:00 o Providers come to see babies at [place] Medicaid o Must have "Meet & Greet" appointment at office prior to delivery . [place] ([place]) o [place],  MD; Juleen China, MD; Clydene Laming, [place] [address], [place], [place] [address] o [phone] o Mon-Wed 8:00-6:00, Thur-Fri 8:00-5:00, Sat 9:00-12:00 o Providers come to  see babies at [facility] o Does NOT accept Medicaid o Only accepting siblings of current patients . [facility] [place], [place], [place], [place]  [address] o [phone]   Fax [phone] . [place] at [place] [address], [place], [place]  [address] o [phone]   Fax - [place] Gorman [phone] & [phone]) . Therapist, music at [place], DO; [place], [place]., [place], [place] [address] o [phone] o Mon-Fri 7:00-5:00 o Babies seen by [facility] providers o Does NOT accept Medicaid . [place], MD; [place], [place]; [place], [place], [place], [place] [address] o [phone] o Mon-Fri 8:00-5:00 o Babies seen by [facility] providers o Accepting Medicaid . [place], MD; [place], [place]; [place], NP; [place], [place], [place], [place] [address] o [phone] o Mon-Fri 8:00-5:00 o Babies seen by providers at [place] [phone]) . [facility] [place], [place] o [address] [place], [place] [address] o [phone] o Mon-Fri 8:00-5:00 o Babies seen by [facility] providers o Does NOT accept Medicaid o Limited availability, please call early in hospitalization to schedule follow-up . [facility] Leilani Merl, PA; Maisie Fus, MD; [place], MD; [place], [place]; Jeannine Kitten, MD; [place], [place] [facility] [address], [place], [place] [address] o [phone] o Mon-Fri 8:30-5:00, Sat 9:00-12:00 o Babies seen by providers at [facility] o Accepting Medicaid o Please register online then schedule online or call office o [email or web address] . [place] ([place] at  [place]) Kristian Covey, NP; Dwyane Dee, MD; Leonidas Romberg, PA o [address] [place], [place], [place] [address] o [phone] o Mon-Fri 8:00-5:00 o Babies seen by providers at [place] o Accepting Medicaid . [place] ([facility] [employer]) Dairl Ponder, MD; Rayvon Char, NP; Melina Modena, MD o [address] [place], [place], [place] [address] o [phone] o Mon-Fri 8:00-5:30, Sat&Sun by appointment (phones open at 8:30) o Babies seen by [facility] providers o Accepting Medicaid o Must be a first-time baby or sibling of current patient . [place], [address], [place], [place]  [address] o [phone]   Fax - [phone]  [place] [phone] & [phone]) . [place], [place]; [place], [place]; Benjamine Mola, MD; [place], [place]; Harrell Lark, MD o [address] [place], [place] [address] o [phone] o Mon-Thur 8:00-7:00, Fri 8:00-5:00, Sat 8:00-12:00, Sun 9:00-12:00 o Babies seen by [facility] providers o Accepting Medicaid . [facility] at [facility], MD; Ruthann Cancer, MD; [facility], MD o [address], [place], [place] [address] o [phone] o Mon-Thur 8:00-5:00 o Babies seen by providers at [facility] o Accepting Medicaid . [facility] at [place], MD; Coe-Go[place], MD; Amedeo Plenty, MD; Bobby Rumpf, MD; List, MD; Lavonia Drafts, MD; Ruthann Cancer, MD; Selinda Eon, MD; Audie Box, MD; Jim Like, MD; Christie Nottingham, MD; Hubbard Hartshorn, MD; Modena Nunnery, MD o [place]., [place], [place]  [address] o [phone] o Mon-Fri 8:00-5:30, Sat (Oct.-Mar.) 9:00-1:00 o Babies seen by providers at [facility] o Accepting Medicaid o Must fill out new patient packet, available online at [email or web address] . [place] ([facility]) Barnabas Lister, NP; Kenton Kingfisher, NP; Claiborne Billings, NP; Rolla Plate, MD;  [place], [place]; Carola Rhine, MD; Tyron Russell, MD; Delia Chimes, NP o [address], [place], [place] [address] o [phone] o Mon-Thur 8:00-5:30, Fri 8:00-5:00 o Babies seen by providers at [place] [phone]) . [place], [place]; [place], MD; Dennard Schaumann, MD; [place], [place] o [address] [place], [place] [address] o [phone] o Mon-Fri 8:00-5:00 o Babies seen by providers at [place] [phone]) . [place] at [place], [place]; Olen Pel, MD; [place], [place], [place], [place] [address] o [phone] o Mon-Fri 8:00-5:00 o Babies seen by providers at [facility] o Does NOT accept Medicaid o Limited appointment availability, please call early in hospitalization  . Therapist, music at [place], [place]; [place], [address] [address] [place], [place] [address] o [phone] o Mon-Fri 8:00-5:00 o Babies seen by [facility] providers o Does NOT accept Medicaid . [facility] Su Grand, MD; Guy Sandifer, MD; [place], [place]; [place], [place] [address], [place], [place] [address] o [phone] o Mon-Fri 8:00-5:00 o After hours clinic [facility])[address] [place], [place] [address]) [phone] Mon-Fri 5:00-8:00, Sat 12:00-6:00, Sun 10:00-4:00 o Babies seen by [facility] providers o Accepting Medicaid . [place] at [facility] o [address], [place], [place]  [address] o [phone]   Fax - [phone]  [place] [phone]) . Therapist, music at [facility], MD o [address] [place], [place], [place] [address] o [phone] o Mon-Fri 8:00-5:00 o Babies seen by [facility] providers o Does NOT accept Medicaid . [employer] ([place] [facility] [place]) Bing Neighbors, MD o [address] [place], [place],   [address] o [phone] o Mon-Thur 8:00-7:00, Fri 8:00-5:00, Sat 8:00-12:00 o Babies seen by providers at [facility] o Accepting Medicaid - but does not have vaccinations in office (must be received elsewhere) o Limited availability, please call early in hospitalization  [place] ([address]) . [place], MD o [address], [place] [address] o [phone]  Fax [phone]  Childbirth Education Options: [facility] Department Classes:  Childbirth education classes can help you get ready for a positive parenting experience. You can also meet other expectant parents and get free stuff for your baby. Each class runs for five weeks on the same night and costs $45 for the mother-to-be and her support person. Medicaid covers the cost if you are eligible. Call [phone] to register. [facility] Childbirth Education:  [phone] or [phone] or [email or web address]  Baby & Me Class: Discuss newborn & infant parenting and family adjustment issues with other new mothers in a relaxed environment. Each week brings a new speaker or baby-centered activity. We encourage new mothers to join [place] every Thursday at 11:00am. Babies birth until crawling. No registration or fee. Daddy [employer]: This course offers Dads-to-be the tools and knowledge needed to feel confident on their journey to becoming new fathers. Experienced dads, who have been trained as coaches, teach dads-to-be how to  hold, comfort, diaper, swaddle and play with their infant while being able to support the new mom as well. A class for men taught by men. $25/dad Big Brother/Big Sister: Let your children share in the joy of a new brother or sister in this special class designed just for them. Class includes discussion about how families care for babies: swaddling, holding, diapering, safety as well as how they can be helpful in their new role. This class is designed for  children ages 20 to 95, but any age is welcome. Please register each child individually. $5/child  Mom Talk: This mom-led group offers support and connection to mothers as they journey through the adjustments and struggles of that sometimes overwhelming first year after the birth of a child. Tuesdays at 10:00am and Thursdays at 6:00pm. Babies welcome. No registration or fee. Breastfeeding Support Group: This group is a mother-to-mother support circle where moms have the opportunity to share their breastfeeding experiences. A Lactation Consultant is present for questions and concerns. Meets each Tuesday at 11:00am. No fee or registration. Breastfeeding Your Baby: Learn what to expect in the first days of breastfeeding your newborn.  This class will help you feel more confident with the skills needed to begin your breastfeeding experience. Many new mothers are concerned about breastfeeding after leaving the hospital. This class will also address the most common fears and challenges about breastfeeding during the first few weeks, months and beyond. (call for fee) Comfort Techniques and Tour: This 2 hour interactive class will provide you the opportunity to learn & practice hands-on techniques that can help relieve some of the discomfort of labor and encourage your baby to rotate toward the best position for birth. You and your partner will be able to try a variety of labor positions with birth balls and rebozos as well as practice breathing, relaxation, and visualization techniques. A tour of the [facility] is included with this class. $20 per registrant and support person Childbirth Class- Weekend Option: This class is a Weekend version of our Birth & Baby series. It is designed for parents who have a difficult time fitting several weeks of classes into their schedule. It covers the care of your newborn and the basics of labor and childbirth. It also includes a [place]  [facility] and lunch. The class is held two consecutive days: beginning on Friday evening from 6:30 - 8:30 p.m. and the next day, Saturday from 9 a.m. - 4 p.m. (call for fee) Doren Custard Class: Interested in a waterbirth?  This informational class will help you discover whether waterbirth is the right fit for you. Education about waterbirth itself, supplies you would need and how to assemble your support team is what you can expect from this class. Some obstetrical practices require this class in order to pursue a waterbirth. (Not all obstetrical practices offer waterbirth-check with your healthcare provider.) Register only the expectant mom, but you are encouraged to bring your partner to class! Required if planning waterbirth, no fee. Infant/Child CPR: Parents, grandparents, babysitters, and friends learn Cardio-Pulmonary Resuscitation skills for infants and children. You will also learn how to treat both conscious and unconscious choking in infants and children. This Family & Friends program does not offer certification. Register each participant individually to ensure that enough mannequins are available. (Call for fee) Grandparent Love: Expecting a grandbaby? This class is for you! Learn about the latest infant care and safety recommendations and ways to support your own child as  he or she transitions into the parenting role. Taught by Registered Nurses who are childbirth instructors, but most importantly...they are grandmothers too! $10/person. Childbirth Class- Natural Childbirth: This series of 5 weekly classes is for expectant parents who want to learn and practice natural methods of coping with the process of labor and childbirth. Relaxation, breathing, massage, visualization, role of the partner, and helpful positioning are highlighted. Participants learn how to be confident in their body's ability to give birth. This class will empower and help parents make informed decisions about their own  care. Includes discussion that will help new parents transition into the immediate postpartum period. [facility] is included. We suggest taking this class between 25-32 weeks, but it's only a recommendation. $75 per registrant and one support person or $30 Medicaid. Childbirth Class- 3 week Series: This option of 3 weekly classes helps you and your labor partner prepare for childbirth. Newborn care, labor & birth, cesarean birth, pain management, and comfort techniques are discussed and a [place] [facility] is included. The class meets at the same time, on the same day of the week for 3 consecutive weeks beginning with the starting date you choose. $60 for registrant and one support person.  Marvelous Multiples: Expecting twins, triplets, or more? This class covers the differences in labor, birth, parenting, and breastfeeding issues that face multiples' parents. NICU tour is included. Led by a Certified Childbirth Educator who is the mother of twins. No fee. Caring for Baby: This class is for expectant and adoptive parents who want to learn and practice the most up-to-date newborn care for their babies. Focus is on birth through the first six weeks of life. Topics include feeding, bathing, diapering, crying, umbilical cord care, circumcision care and safe sleep. Parents learn to recognize symptoms of illness and when to call the pediatrician. Register only the mom-to-be and your partner or support person can plan to come with you! $10 per registrant and support person Childbirth Class- online option: This online class offers you the freedom to complete a Birth and Baby series in the comfort of your own home. The flexibility of this option allows you to review sections at your own pace, at times convenient to you and your support people. It includes additional video information, animations, quizzes, and extended activities. Get organized with helpful  eClass tools, checklists, and trackers. Once you register online for the class, you will receive an email within a few days to accept the invitation and begin the class when the time is right for you. The content will be available to you for 60 days. $60 for 60 days of online access for you and your support people.  Local Doulas: Natural Baby Doulas [email or web address] Tel: [phone] [email or web address] [employer] [phone] [email or web address] The Labor Hassell Halim  (also do waterbirth tub rental) [phone] [email or web address] Triad Birth Doula [phone] [email or web address].fi Sacred Rhythms  [phone] https://[email or web address]-[email or web address]/ [employer] (PADA) [email or web address].[email or web address]/ [place] [facility] Birth and Baby  [email or web address] Considering Waterbirth? Guide for patients at Center for [employer]  Why consider waterbirth?  . Gentle birth for babies . Less pain medicine used in labor . May allow for passive descent/less pushing . May reduce perineal tears  . More mobility and instinctive maternal position changes . Increased maternal relaxation . Reduced blood pressure in labor  Is waterbirth safe? What are the risks of infection, drowning or other complications?  . Infection: o Very low risk (3.7 % for  tub vs 4.8% for bed) o 7 in 8000 waterbirths with documented infection o Poorly cleaned equipment most common cause o Slightly lower group B strep transmission rate  . Drowning o Maternal:  - Very low risk   - Related to seizures or fainting o Newborn:  - Very low risk. No evidence of increased risk of respiratory problems in multiple large studies - Physiological protection from breathing under water - Avoid underwater birth if there are any fetal  complications - Once baby's head is out of the water, keep it out.  . Birth complication o Some reports of cord trauma, but risk decreased by bringing baby to surface gradually o No evidence of increased risk of shoulder dystocia. Mothers can usually change positions faster in water than in a bed, possibly aiding the maneuvers to free the shoulder.   You must attend a Doren Custard class at [facility]  3rd Wednesday of every month from 7-9pm  [employer] by calling [phone] or online at [email or web address]  Bring [place] the certificate from the class to your prenatal appointment  Meet with a midwife at 36 weeks to see if you can still plan a waterbirth and to sign the consent.   Purchase or rent the following supplies:   Water Birth Pool (Birth Pool in a Box or [place] for instance)  (Tubs start ~$125)  Single-use disposable tub liner designed for your brand of tub  New garden hose labeled "lead-free", "suitable for drinking water",  Electric drain pump to remove water (We recommend 792 gallon per hour or greater pump.)   Separate garden hose to remove the dirty water  Fish net  Bathing suit top (optional)  Long-handled mirror (optional)  Places to purchase or rent supplies  [email or web address] for tub purchases and supplies  [email or web address] for tub purchases and supplies  [employer] ([email or web address]) $275 for tub rental/set-up & take down/kit   [employer] (http://[email or web address].[email or web address]) Information regarding doulas (labor support) who provide pool rentals  Our practice has a Birth Pool in a Box tub at the hospital that you may borrow on a first-come-first-served basis. It is your responsibility to to set up, clean and break down the tub. We cannot guarantee the availability of this tub in advance. You are responsible for bringing all accessories listed above. If you do not have all necessary supplies you cannot  have a waterbirth.    Things that would prevent you from having a waterbirth:  Premature, <37wks  Previous cesarean birth  Presence of thick meconium-stained fluid  Multiple gestation (Twins, triplets, etc.)  Uncontrolled diabetes or gestational diabetes requiring medication  Hypertension requiring medication or diagnosis of pre-eclampsia  Heavy vaginal bleeding  Non-reassuring fetal heart rate  Active infection (MRSA, etc.). Group B Strep is NOT a contraindication for  waterbirth.  If your labor has to be induced and induction method requires continuous  monitoring of the baby's heart rate  Other risks/issues identified by your obstetrical provider  Please remember that birth is unpredictable. Under certain unforeseeable circumstances your provider may advise against giving birth in the tub. These decisions will be made on a case-by-case basis and with the safety of you and your baby as our highest priority.      Second Trimester of Pregnancy The second trimester is from week 14 through week 27 (months 4 through 6). The second trimester is often a time when you feel your best. Your body has adjusted to being pregnant, and you begin to feel better  physically. Usually, morning sickness has lessened or quit completely, you may have more energy, and you may have an increase in appetite. The second trimester is also a time when the fetus is growing rapidly. At the end of the sixth month, the fetus is about 9 inches long and weighs about 1 pounds. You will likely begin to feel the baby move (quickening) between 16 and 20 weeks of pregnancy. Body changes during your second trimester Your body continues to go through many changes during your second trimester. The changes vary from woman to woman.  Your weight will continue to increase. You will notice your lower abdomen bulging out.  You may begin to get stretch marks on your hips, abdomen, and breasts.  You may develop headaches  that can be relieved by medicines. The medicines should be approved by your health care provider.  You may urinate more often because the fetus is pressing on your bladder.  You may develop or continue to have heartburn as a result of your pregnancy.  You may develop constipation because certain hormones are causing the muscles that push waste through your intestines to slow down.  You may develop hemorrhoids or swollen, bulging veins (varicose veins).  You may have back pain. This is caused by: ? Weight gain. ? Pregnancy hormones that are relaxing the joints in your pelvis. ? A shift in weight and the muscles that support your balance.  Your breasts will continue to grow and they will continue to become tender.  Your gums may bleed and may be sensitive to brushing and flossing.  Dark spots or blotches (chloasma, mask of pregnancy) may develop on your face. This will likely fade after the baby is born.  A dark line from your belly button to the pubic area (linea nigra) may appear. This will likely fade after the baby is born.  You may have changes in your hair. These can include thickening of your hair, rapid growth, and changes in texture. Some women also have hair loss during or after pregnancy, or hair that feels dry or thin. Your hair will most likely return to normal after your baby is born. What to expect at prenatal visits During a routine prenatal visit:  You will be weighed to make sure you and the fetus are growing normally.  Your blood pressure will be taken.  Your abdomen will be measured to track your baby's growth.  The fetal heartbeat will be listened to.  Any test results from the previous visit will be discussed. Your health care provider may ask you:  How you are feeling.  If you are feeling the baby move.  If you have had any abnormal symptoms, such as leaking fluid, bleeding, severe headaches, or abdominal cramping.  If you are using any tobacco  products, including cigarettes, chewing tobacco, and electronic cigarettes.  If you have any questions. Other tests that may be performed during your second trimester include:  Blood tests that check for: ? Low iron levels (anemia). ? High blood sugar that affects pregnant women (gestational diabetes) between 17 and 28 weeks. ? Rh antibodies. This is to check for a protein on red blood cells (Rh factor).  Urine tests to check for infections, diabetes, or protein in the urine.  An ultrasound to confirm the proper growth and development of the baby.  An amniocentesis to check for possible genetic problems.  Fetal screens for spina bifida and Down syndrome.  HIV (human immunodeficiency virus) testing. Routine prenatal testing includes  screening for HIV, unless you choose not to have this test. Follow these instructions at home: Medicines  Follow your health care provider's instructions regarding medicine use. Specific medicines may be either safe or unsafe to take during pregnancy.  Take a prenatal vitamin that contains at least 600 micrograms (mcg) of folic acid.  If you develop constipation, try taking a stool softener if your health care provider approves. Eating and drinking   Eat a balanced diet that includes fresh fruits and vegetables, whole grains, good sources of protein such as meat, eggs, or tofu, and low-fat dairy. Your health care provider will help you determine the amount of weight gain that is right for you.  Avoid raw meat and uncooked cheese. These carry germs that can cause birth defects in the baby.  If you have low calcium intake from food, talk to your health care provider about whether you should take a daily calcium supplement.  Limit foods that are high in fat and processed sugars, such as fried and sweet foods.  To prevent constipation: ? Drink enough fluid to keep your urine clear or pale yellow. ? Eat foods that are high in fiber, such as fresh fruits  and vegetables, whole grains, and beans. Activity  Exercise only as directed by your health care provider. Most women can continue their usual exercise routine during pregnancy. Try to exercise for 30 minutes at least 5 days a week. Stop exercising if you experience uterine contractions.  Avoid heavy lifting, wear low heel shoes, and practice good posture.  A sexual relationship may be continued unless your health care provider directs you otherwise. Relieving pain and discomfort  Wear a good support bra to prevent discomfort from breast tenderness.  Take warm sitz baths to soothe any pain or discomfort caused by hemorrhoids. Use hemorrhoid cream if your health care provider approves.  Rest with your legs elevated if you have leg cramps or low back pain.  If you develop varicose veins, wear support hose. Elevate your feet for 15 minutes, 3-4 times a day. Limit salt in your diet. Prenatal Care  Write down your questions. Take them to your prenatal visits.  Keep all your prenatal visits as told by your health care provider. This is important. Safety  Wear your seat belt at all times when driving.  Make a list of emergency phone numbers, including numbers for family, friends, the hospital, and police and fire departments. General instructions  Ask your health care provider for a referral to a local prenatal education class. Begin classes no later than the beginning of month 6 of your pregnancy.  Ask for help if you have counseling or nutritional needs during pregnancy. Your health care provider can offer advice or refer you to specialists for help with various needs.  Do not use hot tubs, steam rooms, or saunas.  Do not douche or use tampons or scented sanitary pads.  Do not cross your legs for long periods of time.  Avoid cat litter boxes and soil used by cats. These carry germs that can cause birth defects in the baby and possibly loss of the fetus by miscarriage or  stillbirth.  Avoid all smoking, herbs, alcohol, and unprescribed drugs. Chemicals in these products can affect the formation and growth of the baby.  Do not use any products that contain nicotine or tobacco, such as cigarettes and e-cigarettes. If you need help quitting, ask your health care provider.  Visit your dentist if you have not gone yet during  your pregnancy. Use a soft toothbrush to brush your teeth and be gentle when you floss. Contact a health care provider if:  You have dizziness.  You have mild pelvic cramps, pelvic pressure, or nagging pain in the abdominal area.  You have persistent nausea, vomiting, or diarrhea.  You have a bad smelling vaginal discharge.  You have pain when you urinate. Get help right away if:  You have a fever.  You are leaking fluid from your vagina.  You have spotting or bleeding from your vagina.  You have severe abdominal cramping or pain.  You have rapid weight gain or weight loss.  You have shortness of breath with chest pain.  You notice sudden or extreme swelling of your face, hands, ankles, feet, or legs.  You have not felt your baby move in over an hour.  You have severe headaches that do not go away when you take medicine.  You have vision changes. Summary  The second trimester is from week 14 through week 27 (months 4 through 6). It is also a time when the fetus is growing rapidly.  Your body goes through many changes during pregnancy. The changes vary from woman to woman.  Avoid all smoking, herbs, alcohol, and unprescribed drugs. These chemicals affect the formation and growth your baby.  Do not use any tobacco products, such as cigarettes, chewing tobacco, and e-cigarettes. If you need help quitting, ask your health care provider.  Contact your health care provider if you have any questions. Keep all prenatal visits as told by your health care provider. This is important. This information is not intended to replace  advice given to you by your health care provider. Make sure you discuss any questions you have with your health care provider. Document Released: 04/01/2001 Document Revised: 05/13/2016 Document Reviewed: 05/13/2016 Elsevier Interactive Patient Education  2019 [employer].   Contraception Choices Contraception, also called birth control, refers to methods or devices that prevent pregnancy. Hormonal methods Contraceptive implant  A contraceptive implant is a thin, plastic tube that contains a hormone. It is inserted into the upper part of the arm. It can remain in place for up to 3 years. Progestin-only injections Progestin-only injections are injections of progestin, a synthetic form of the hormone progesterone. They are given every 3 months by a health care provider. Birth control pills  Birth control pills are pills that contain hormones that prevent pregnancy. They must be taken once a day, preferably at the same time each day. Birth control patch  The birth control patch contains hormones that prevent pregnancy. It is placed on the skin and must be changed once a week for three weeks and removed on the fourth week. A prescription is needed to use this method of contraception. Vaginal ring  A vaginal ring contains hormones that prevent pregnancy. It is placed in the vagina for three weeks and removed on the fourth week. After that, the process is repeated with a new ring. A prescription is needed to use this method of contraception. Emergency contraceptive Emergency contraceptives prevent pregnancy after unprotected sex. They come in pill form and can be taken up to 5 days after sex. They work best the sooner they are taken after having sex. Most emergency contraceptives are available without a prescription. This method should not be used as your only form of birth control. Barrier methods Female condom  A female condom is a thin sheath that is worn over the penis during sex. Condoms keep  sperm  from going inside a woman's body. They can be used with a spermicide to increase their effectiveness. They should be disposed after a single use. Female condom  A female condom is a soft, loose-fitting sheath that is put into the vagina before sex. The condom keeps sperm from going inside a woman's body. They should be disposed after a single use. Diaphragm  A diaphragm is a soft, dome-shaped barrier. It is inserted into the vagina before sex, along with a spermicide. The diaphragm blocks sperm from entering the uterus, and the spermicide kills sperm. A diaphragm should be left in the vagina for 6-8 hours after sex and removed within 24 hours. A diaphragm is prescribed and fitted by a health care provider. A diaphragm should be replaced every 1-2 years, after giving birth, after gaining more than 15 lb (6.8 kg), and after pelvic surgery. Cervical cap  A cervical cap is a round, soft latex or plastic cup that fits over the cervix. It is inserted into the vagina before sex, along with spermicide. It blocks sperm from entering the uterus. The cap should be left in place for 6-8 hours after sex and removed within 48 hours. A cervical cap must be prescribed and fitted by a health care provider. It should be replaced every 2 years. Sponge  A sponge is a soft, circular piece of polyurethane foam with spermicide on it. The sponge helps block sperm from entering the uterus, and the spermicide kills sperm. To use it, you make it wet and then insert it into the vagina. It should be inserted before sex, left in for at least 6 hours after sex, and removed and thrown away within 30 hours. Spermicides Spermicides are chemicals that kill or block sperm from entering the cervix and uterus. They can come as a cream, jelly, suppository, foam, or tablet. A spermicide should be inserted into the vagina with an applicator at least 28-36 minutes before sex to allow time for it to work. The process must be repeated  every time you have sex. Spermicides do not require a prescription. Intrauterine contraception Intrauterine device (IUD) An IUD is a T-shaped device that is put in a woman's uterus. There are two types:  Hormone IUD.This type contains progestin, a synthetic form of the hormone progesterone. This type can stay in place for 3-5 years.  Copper IUD.This type is wrapped in copper wire. It can stay in place for 10 years.  Permanent methods of contraception Female tubal ligation In this method, a woman's fallopian tubes are sealed, tied, or blocked during surgery to prevent eggs from traveling to the uterus. Hysteroscopic sterilization In this method, a small, flexible insert is placed into each fallopian tube. The inserts cause scar tissue to form in the fallopian tubes and block them, so sperm cannot reach an egg. The procedure takes about 3 months to be effective. Another form of birth control must be used during those 3 months. Female sterilization This is a procedure to tie off the tubes that carry sperm (vasectomy). After the procedure, the man can still ejaculate fluid (semen). Natural planning methods Natural family planning In this method, a couple does not have sex on days when the woman could become pregnant. Calendar method This means keeping track of the length of each menstrual cycle, identifying the days when pregnancy can happen, and not having sex on those days. Ovulation method In this method, a couple avoids sex during ovulation. Symptothermal method This method involves not having sex during ovulation. The  woman typically checks for ovulation by watching changes in her temperature and in the consistency of cervical mucus. Post-ovulation method In this method, a couple waits to have sex until after ovulation. Summary  Contraception, also called birth control, means methods or devices that prevent pregnancy.  Hormonal methods of contraception include implants, injections,  pills, patches, vaginal rings, and emergency contraceptives.  Barrier methods of contraception can include female condoms, female condoms, diaphragms, cervical caps, sponges, and spermicides.  There are two types of IUDs (intrauterine devices). An IUD can be put in a woman's uterus to prevent pregnancy for 3-5 years.  Permanent sterilization can be done through a procedure for males, females, or both.  Natural family planning methods involve not having sex on days when the woman could become pregnant. This information is not intended to replace advice given to you by your health care provider. Make sure you discuss any questions you have with your health care provider. Document Released: 04/07/2005 Document Revised: 04/09/2017 Document Reviewed: 05/10/2016 Elsevier Interactive Patient Education  2019 [employer].   Breastfeeding  Choosing to breastfeed is one of the best decisions you can make for yourself and your baby. A change in hormones during pregnancy causes your breasts to make breast milk in your milk-producing glands. Hormones prevent breast milk from being released before your baby is born. They also prompt milk flow after birth. Once breastfeeding has begun, thoughts of your baby, as well as his or her sucking or crying, can stimulate the release of milk from your milk-producing glands. Benefits of breastfeeding Research shows that breastfeeding offers many health benefits for infants and mothers. It also offers a cost-free and convenient way to feed your baby. For your baby  Your first milk (colostrum) helps your baby's digestive system to function better.  Special cells in your milk (antibodies) help your baby to fight off infections.  Breastfed babies are less likely to develop asthma, allergies, obesity, or type 2 diabetes. They are also at lower risk for sudden infant death syndrome (SIDS).  Nutrients in breast milk are better able to meet your baby's needs compared to  infant formula.  Breast milk improves your baby's brain development. For you  Breastfeeding helps to create a very special bond between you and your baby.  Breastfeeding is convenient. Breast milk costs nothing and is always available at the correct temperature.  Breastfeeding helps to burn calories. It helps you to lose the weight that you gained during pregnancy.  Breastfeeding makes your uterus return faster to its size before pregnancy. It also slows bleeding (lochia) after you give birth.  Breastfeeding helps to lower your risk of developing type 2 diabetes, osteoporosis, rheumatoid arthritis, cardiovascular disease, and breast, ovarian, uterine, and endometrial cancer later in life. Breastfeeding basics Starting breastfeeding  Find a comfortable place to sit or lie down, with your neck and back well-supported.  Place a pillow or a rolled-up blanket under your baby to bring him or her to the level of your breast (if you are seated). Nursing pillows are specially designed to help support your arms and your baby while you breastfeed.  Make sure that your baby's tummy (abdomen) is facing your abdomen.  Gently massage your breast. With your fingertips, massage from the outer edges of your breast inward toward the nipple. This encourages milk flow. If your milk flows slowly, you may need to continue this action during the feeding.  Support your breast with 4 fingers underneath and your thumb above your nipple (make  the letter "C" with your hand). Make sure your fingers are well away from your nipple and your baby's mouth.  Stroke your baby's lips gently with your finger or nipple.  When your baby's mouth is open wide enough, quickly bring your baby to your breast, placing your entire nipple and as much of the areola as possible into your baby's mouth. The areola is the colored area around your nipple. ? More areola should be visible above your baby's upper lip than below the lower  lip. ? Your baby's lips should be opened and extended outward (flanged) to ensure an adequate, comfortable latch. ? Your baby's tongue should be between his or her lower gum and your breast.  Make sure that your baby's mouth is correctly positioned around your nipple (latched). Your baby's lips should create a seal on your breast and be turned out (everted).  It is common for your baby to suck about 2-3 minutes in order to start the flow of breast milk. Latching Teaching your baby how to latch onto your breast properly is very important. An improper latch can cause nipple pain, decreased milk supply, and poor weight gain in your baby. Also, if your baby is not latched onto your nipple properly, he or she may swallow some air during feeding. This can make your baby fussy. Burping your baby when you switch breasts during the feeding can help to get rid of the air. However, teaching your baby to latch on properly is still the best way to prevent fussiness from swallowing air while breastfeeding. Signs that your baby has successfully latched onto your nipple  Silent tugging or silent sucking, without causing you pain. Infant's lips should be extended outward (flanged).  Swallowing heard between every 3-4 sucks once your milk has started to flow (after your let-down milk reflex occurs).  Muscle movement above and in front of his or her ears while sucking. Signs that your baby has not successfully latched onto your nipple  Sucking sounds or smacking sounds from your baby while breastfeeding.  Nipple pain. If you think your baby has not latched on correctly, slip your finger into the corner of your baby's mouth to break the suction and place it between your baby's gums. Attempt to start breastfeeding again. Signs of successful breastfeeding Signs from your baby  Your baby will gradually decrease the number of sucks or will completely stop sucking.  Your baby will fall asleep.  Your baby's body  will relax.  Your baby will retain a small amount of milk in his or her mouth.  Your baby will let go of your breast by himself or herself. Signs from you  Breasts that have increased in firmness, weight, and size 1-3 hours after feeding.  Breasts that are softer immediately after breastfeeding.  Increased milk volume, as well as a change in milk consistency and color by the fifth day of breastfeeding.  Nipples that are not sore, cracked, or bleeding. Signs that your baby is getting enough milk  Wetting at least 1-2 diapers during the first 24 hours after birth.  Wetting at least 5-6 diapers every 24 hours for the first week after birth. The urine should be clear or pale yellow by the age of 5 days.  Wetting 6-8 diapers every 24 hours as your baby continues to grow and develop.  At least 3 stools in a 24-hour period by the age of 5 days. The stool should be soft and yellow.  At least 3 stools in  a 24-hour period by the age of 7 days. The stool should be seedy and yellow.  No loss of weight greater than 10% of birth weight during the first 3 days of life.  Average weight gain of 4-7 oz (113-198 g) per week after the age of 4 days.  Consistent daily weight gain by the age of 5 days, without weight loss after the age of 2 weeks. After a feeding, your baby may spit up a small amount of milk. This is normal. Breastfeeding frequency and duration Frequent feeding will help you make more milk and can prevent sore nipples and extremely full breasts (breast engorgement). Breastfeed when you feel the need to reduce the fullness of your breasts or when your baby shows signs of hunger. This is called "breastfeeding on demand." Signs that your baby is hungry include:  Increased alertness, activity, or restlessness.  Movement of the head from side to side.  Opening of the mouth when the corner of the mouth or cheek is stroked (rooting).  Increased sucking sounds, smacking lips, cooing,  sighing, or squeaking.  Hand-to-mouth movements and sucking on fingers or hands.  Fussing or crying. Avoid introducing a pacifier to your baby in the first 4-6 weeks after your baby is born. After this time, you may choose to use a pacifier. Research has shown that pacifier use during the first year of a baby's life decreases the risk of sudden infant death syndrome (SIDS). Allow your baby to feed on each breast as long as he or she wants. When your baby unlatches or falls asleep while feeding from the first breast, offer the second breast. Because newborns are often sleepy in the first few weeks of life, you may need to awaken your baby to get him or her to feed. Breastfeeding times will vary from baby to baby. However, the following rules can serve as a guide to help you make sure that your baby is properly fed:  Newborns (babies 64 weeks of age or younger) may breastfeed every 1-3 hours.  Newborns should not go without breastfeeding for longer than 3 hours during the day or 5 hours during the night.  You should breastfeed your baby a minimum of 8 times in a 24-hour period. Breast milk pumping     Pumping and storing breast milk allows you to make sure that your baby is exclusively fed your breast milk, even at times when you are unable to breastfeed. This is especially important if you go back to work while you are still breastfeeding, or if you are not able to be present during feedings. Your lactation consultant can help you find a method of pumping that works best for you and give you guidelines about how long it is safe to store breast milk. Caring for your breasts while you breastfeed Nipples can become dry, cracked, and sore while breastfeeding. The following recommendations can help keep your breasts moisturized and healthy:  Avoid using soap on your nipples.  Wear a supportive bra designed especially for nursing. Avoid wearing underwire-style bras or extremely tight bras (sports  bras).  Air-dry your nipples for 3-4 minutes after each feeding.  Use only cotton bra pads to absorb leaked breast milk. Leaking of breast milk between feedings is normal.  Use lanolin on your nipples after breastfeeding. Lanolin helps to maintain your skin's normal moisture barrier. Pure lanolin is not harmful (not toxic) to your baby. You may also hand express a few drops of breast milk and gently massage  that milk into your nipples and allow the milk to air-dry. In the first few weeks after giving birth, some women experience breast engorgement. Engorgement can make your breasts feel heavy, warm, and tender to the touch. Engorgement peaks within 3-5 days after you give birth. The following recommendations can help to ease engorgement:  Completely empty your breasts while breastfeeding or pumping. You may want to start by applying warm, moist heat (in the shower or with warm, water-soaked hand towels) just before feeding or pumping. This increases circulation and helps the milk flow. If your baby does not completely empty your breasts while breastfeeding, pump any extra milk after he or she is finished.  Apply ice packs to your breasts immediately after breastfeeding or pumping, unless this is too uncomfortable for you. To do this: ? Put ice in a plastic bag. ? Place a towel between your skin and the bag. ? Leave the ice on for 20 minutes, 2-3 times a day.  Make sure that your baby is latched on and positioned properly while breastfeeding. If engorgement persists after 48 hours of following these recommendations, contact your health care provider or a Science writer. Overall health care recommendations while breastfeeding  Eat 3 healthy meals and 3 snacks every day. Well-nourished mothers who are breastfeeding need an additional 450-500 calories a day. You can meet this requirement by increasing the amount of a balanced diet that you eat.  Drink enough water to keep your urine pale  yellow or clear.  Rest often, relax, and continue to take your prenatal vitamins to prevent fatigue, stress, and low vitamin and mineral levels in your body (nutrient deficiencies).  Do not use any products that contain nicotine or tobacco, such as cigarettes and e-cigarettes. Your baby may be harmed by chemicals from cigarettes that pass into breast milk and exposure to secondhand smoke. If you need help quitting, ask your health care provider.  Avoid alcohol.  Do not use illegal drugs or marijuana.  Talk with your health care provider before taking any medicines. These include over-the-counter and prescription medicines as well as vitamins and herbal supplements. Some medicines that may be harmful to your baby can pass through breast milk.  It is possible to become pregnant while breastfeeding. If birth control is desired, ask your health care provider about options that will be safe while breastfeeding your baby. Where to find more information: [employer]: www.llli.org Contact a health care provider if:  You feel like you want to stop breastfeeding or have become frustrated with breastfeeding.  Your nipples are cracked or bleeding.  Your breasts are red, tender, or warm.  You have: ? Painful breasts or nipples. ? A swollen area on either breast. ? A fever or chills. ? Nausea or vomiting. ? Drainage other than breast milk from your nipples.  Your breasts do not become full before feedings by the fifth day after you give birth.  You feel sad and depressed.  Your baby is: ? Too sleepy to eat well. ? Having trouble sleeping. ? More than 46 week old and wetting fewer than 6 diapers in a 24-hour period. ? Not gaining weight by 2 days of age.  Your baby has fewer than 3 stools in a 24-hour period.  Your baby's skin or the white parts of his or her eyes become yellow. Get help right away if:  Your baby is overly tired (lethargic) and does not want to wake up  and feed.  Your baby develops  an unexplained fever. Summary  Breastfeeding offers many health benefits for infant and mothers.  Try to breastfeed your infant when he or she shows early signs of hunger.  Gently tickle or stroke your baby's lips with your finger or nipple to allow the baby to open his or her mouth. Bring the baby to your breast. Make sure that much of the areola is in your baby's mouth. Offer one side and burp the baby before you offer the other side.  Talk with your health care provider or lactation consultant if you have questions or you face problems as you breastfeed. This information is not intended to replace advice given to you by your health care provider. Make sure you discuss any questions you have with your health care provider. Document Released: 04/07/2005 Document Revised: 05/09/2016 Document Reviewed: 05/09/2016 Elsevier Interactive Patient Education  2019 [employer].

## 2018-09-02 NOTE — Progress Notes (Signed)
Pt states she had a Pap Smear at [facility] in December of 2019.  Pt states she received a flu vaccine at [facility] in [place].

## 2018-09-02 NOTE — Telephone Encounter (Signed)
The patient called in stating she would be a little late for the appointment scheduled today.

## 2018-09-03 ENCOUNTER — Encounter: Payer: Self-pay | Admitting: Obstetrics and Gynecology

## 2018-09-03 DIAGNOSIS — Z283 Underimmunization status: Secondary | ICD-10-CM | POA: Insufficient documentation

## 2018-09-03 DIAGNOSIS — O26899 Other specified pregnancy related conditions, unspecified trimester: Secondary | ICD-10-CM

## 2018-09-03 DIAGNOSIS — O09899 Supervision of other high risk pregnancies, unspecified trimester: Secondary | ICD-10-CM | POA: Insufficient documentation

## 2018-09-03 DIAGNOSIS — Z6791 Unspecified blood type, Rh negative: Secondary | ICD-10-CM | POA: Insufficient documentation

## 2018-09-03 LAB — OBSTETRIC PANEL, INCLUDING HIV
Antibody Screen: NEGATIVE
Basophils Absolute: 0 10*3/uL (ref 0.0–0.2)
Basos: 0 %
EOS (ABSOLUTE): 0.4 10*3/uL (ref 0.0–0.4)
Eos: 5 %
HIV Screen 4th Generation wRfx: NONREACTIVE
Hematocrit: 28.5 % — ABNORMAL LOW (ref 34.0–46.6)
Hemoglobin: 9.8 g/dL — ABNORMAL LOW (ref 11.1–15.9)
Hepatitis B Surface Ag: NEGATIVE
Immature Grans (Abs): 0 10*3/uL (ref 0.0–0.1)
Immature Granulocytes: 0 %
Lymphocytes Absolute: 2.9 10*3/uL (ref 0.7–3.1)
Lymphs: 38 %
MCH: 28.7 pg (ref 26.6–33.0)
MCHC: 34.4 g/dL (ref 31.5–35.7)
MCV: 84 fL (ref 79–97)
Monocytes Absolute: 0.5 10*3/uL (ref 0.1–0.9)
Monocytes: 7 %
Neutrophils Absolute: 3.8 10*3/uL (ref 1.4–7.0)
Neutrophils: 50 %
Platelets: 492 10*3/uL — ABNORMAL HIGH (ref 150–450)
RBC: 3.41 x10E6/uL — ABNORMAL LOW (ref 3.77–5.28)
RDW: 14.7 % (ref 11.7–15.4)
RPR Ser Ql: NONREACTIVE
Rh Factor: NEGATIVE
Rubella Antibodies, IGG: <0.9 index — ABNORMAL LOW (ref >0.99)
WBC: 7.6 10*3/uL (ref 3.4–10.8)

## 2018-09-03 LAB — HEMOGLOBIN A1C
Est. average glucose Bld gHb Est-mCnc: 100 mg/dL
Hgb A1c MFr Bld: 5.1 % (ref 4.8–5.6)

## 2018-09-04 LAB — URINE CULTURE, OB REFLEX: Organism ID, Bacteria: NO GROWTH

## 2018-09-04 LAB — CULTURE, OB URINE

## 2018-09-06 DIAGNOSIS — O99019 Anemia complicating pregnancy, unspecified trimester: Secondary | ICD-10-CM | POA: Insufficient documentation

## 2018-09-06 MED ORDER — FERROUS SULFATE 325 (65 FE) MG PO TABS: 325.0000 mg | ORAL_TABLET | Freq: Two times a day (BID) | ORAL | 1 refills | Status: DC

## 2018-09-06 NOTE — Addendum Note (Signed)
Addended by: [name] on: 09/06/2018 05:47 PM   Modules accepted: Orders

## 2018-09-07 NOTE — Telephone Encounter (Signed)
Please let the patient know I received her message Please offer her an office visit with me tomorrow at 4 PM to evaluate her symptoms; in person visit

## 2018-09-08 ENCOUNTER — Ambulatory Visit (INDEPENDENT_AMBULATORY_CARE_PROVIDER_SITE_OTHER): Payer: Medicaid Other | Admitting: Internal Medicine

## 2018-09-08 ENCOUNTER — Telehealth: Payer: Self-pay

## 2018-09-08 ENCOUNTER — Other Ambulatory Visit: Payer: Self-pay

## 2018-09-08 ENCOUNTER — Encounter: Payer: Self-pay | Admitting: Internal Medicine

## 2018-09-08 VITALS — Ht <= 58 in | Wt 102.0 lb

## 2018-09-08 DIAGNOSIS — K50811 Crohn's disease of both small and large intestine with rectal bleeding: Secondary | ICD-10-CM | POA: Diagnosis not present

## 2018-09-08 DIAGNOSIS — K648 Other hemorrhoids: Secondary | ICD-10-CM

## 2018-09-08 NOTE — Telephone Encounter (Signed)
Patient contacted about My Chart message.  She is asked to come for an appt today with Dr. Hilarie Fredrickson at 4:00.  Covid-19 travel screening questions  Have you traveled in the last 14 days? no If yes where?  Do you now or have you had a fever in the last 14 days?  no  Do you have any respiratory symptoms of shortness of breath or cough now or in the last 14 days?  no  Do you have any family members or close contacts with diagnosed or suspected Covid-19?  no

## 2018-09-08 NOTE — Patient Instructions (Signed)
It looks like you have internal hemorrhoids. Use a preparation H suppository (over the counter) every night x 3 nights, and then as needed.  Make sure to tell your OBGYN about this.  Continue Stelara.

## 2018-09-08 NOTE — Progress Notes (Signed)
Subjective:    Patient ID: Whitney Lowe, female    DOB: April 22, 1985, 33 y.o.   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 difficile colitis, ADHD, uterine fibroid and currently [redacted] weeks pregnant who presents for recent rectal bleeding.  She was seen virtually by Dr. Carlean Purl on 07/21/2018 and by Nicoletta Ba in the office on 07/22/2017.  She is here alone today.  I met her during a hospitalization for an exacerbation of her Crohn's disease in 2019.  At that time she was on Humira and it was discovered that she had developed Humira antibodies.  She required a prednisone taper and we had considered starting her on Cimzia.  Due to the fact that our practice was not covered by her medical insurance she establish care with Dr. Karlton Lemon at [facility].  She was subsequently started on Stelara by standard induction with infusion and then 90 mcg every 8 weeks.  Her last Stelara dose was 07/28/2018.  She became pregnant and is currently 15 weeks.  She receives her obstetric care from Dr. Elly Modena.  Around week 11 of her pregnancy she developed some lower GI gas which she thought was related to diet.  She was seen in the ER and was told that this was likely uterine fibroid pain.  She had strain to pass gas and passed bright red blood which dripped into the toilet.  This was only present for a day or so and went away.  Then 2 days ago while having a bowel movement she passed gas and then noticed some dripping blood in the toilet and on the stool and then seen again with wiping.  This was bright red and painless in nature.  She has not had any abdominal pain.  Stools have been soft and formed.  She has a bowel movement about every other day.  Since having the blood she has avoided having a bowel movement though she needed to have a bowel movement this morning and only saw a trace amount of blood.  Again  no rectal pain no anal pain and no abdominal pain.  No vaginal bleeding.  No nausea or vomiting.  No fevers or chills.  She had a colonoscopy in March 2018.  Colonoscopy revealed patchy inflammation consistent with Crohn's disease from the anus to cecum.  Moderate in severity.  Biopsies performed showed chronic active colitis with mucosal ulceration and small nonnecrotizing granuloma.  No dysplasia.  Review of Systems As per HPI, otherwise negative  Current Medications, Allergies, Past Medical History, Past Surgical History, Family History and Social History were reviewed in [employer] record.     Objective:   Physical Exam Ht _0  (1.473 m)    Wt 102 lb (46.3 kg)    LMP 05/21/2018 (Exact Date)    BMI 21.32 kg/m  Constitutional: Well-developed and well-nourished. No distress. HEENT: Normocephalic and atraumatic.  Conjunctivae are normal.  No scleral icterus. Neck: Neck supple. Trachea midline. Cardiovascular: Normal rate, regular rhythm and intact distal pulses.  Pulmonary/chest: Effort normal and breath sounds normal. No wheezing, rales or rhonchi. Abdominal: Soft, gravid with top of uterus just below umbilicus, nontender except for palpable firm mass right lower uterus (likely fibroid), nondistended. Bowel sounds active throughout.  ANOSCOPY: Using a disposable, lubricated, slotted, self-illuminating anoscope, the rectum was intubated without difficulty. The trochar was removed and the ano-rectum was circumferentially inspected.  There were internal hemorrhoids, right and left. There was no finding of an anorectal fissure. The rectal mucosa did not appear inflamed. No neoplasia or other pathology was identified. The inspection was well tolerated.  Extremities: no clubbing, cyanosis, or edema Neurological: Alert and oriented to person place and time. Skin: Skin is warm and dry. Psychiatric: Normal mood and affect. Behavior is normal.      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 difficile colitis, ADHD, uterine fibroid and currently [redacted] weeks pregnant who presents for recent rectal bleeding.   1.  Bleeding internal hemorrhoids --the bleeding she has experienced this week and likely a month ago is secondary to bleeding internal hemorrhoids.  We discussed internal hemorrhoids today at length.  Reassurance provided.  With Crohn's disease we always have to question whether there could be active colitis or proctitis but anoscopy proved hemorrhoids today.  We will try Preparation H suppository nightly for 3 or 4 days as needed.  Stools are soft and I do not think at this point we need stool softener.  I encouraged her not to delay bowel movement as this may exacerbate hemorrhoids. --Preparation H over-the-counter suppository for 3 days nightly as needed; I asked that she discuss this also with Dr. Elly Modena to let her know our treatment plan and to ensure she is okay with the plan  2.  Ileocolonic Crohn's disease --felt to be in remission currently.  Continue Stelara 90 mcg every 8 weeks  Follow-up with me in late September, which will be about a month before her delivery

## 2018-09-10 ENCOUNTER — Telehealth (INDEPENDENT_AMBULATORY_CARE_PROVIDER_SITE_OTHER): Payer: Medicaid Other | Admitting: Lactation Services

## 2018-09-10 ENCOUNTER — Other Ambulatory Visit: Payer: Self-pay | Admitting: Obstetrics & Gynecology

## 2018-09-10 DIAGNOSIS — O99019 Anemia complicating pregnancy, unspecified trimester: Secondary | ICD-10-CM

## 2018-09-10 NOTE — Telephone Encounter (Signed)
Called and spoke with patient in regards to [place] she sent in about iron supplements and Chrons symptoms.   Informed pt that I spoke with Dr. Harolyn Rutherford about her symptoms. It was recommended that she stop the Oral Fe. And go for Fe infusions once a week x 2.   Informed pt that her first appt is at [facility] and to go thru admitting and they will get her to the Short Stay unit. Her first appt is scheduled for 6/1 @ 10:00.  Informed pt that she still needs to reach out to her GI doctor for follow up care also.   Pt voiced understanding.

## 2018-09-10 NOTE — Progress Notes (Signed)
Patient reported adverse GI effects to oral iron therapy.  Feraheme ordered, this will be scheduled for patient later. Orders placed.  Verita Schneiders, MD, [place] for [employer], [place]

## 2018-09-15 ENCOUNTER — Encounter: Payer: Self-pay | Admitting: *Deleted

## 2018-09-20 ENCOUNTER — Other Ambulatory Visit: Payer: Self-pay

## 2018-09-20 ENCOUNTER — Encounter (HOSPITAL_COMMUNITY)
Admission: RE | Admit: 2018-09-20 | Discharge: 2018-09-20 | Disposition: A | Discharge status: Home / Self Care | Payer: Medicaid Other | Source: Ambulatory Visit | Attending: Obstetrics & Gynecology | Admitting: Obstetrics & Gynecology

## 2018-09-20 DIAGNOSIS — O99012 Anemia complicating pregnancy, second trimester: Secondary | ICD-10-CM | POA: Insufficient documentation

## 2018-09-20 MED ORDER — SODIUM CHLORIDE 0.9 % IV SOLN
510.0000 mg | INTRAVENOUS | Status: DC
  Administered 2018-09-20: 510 mg via INTRAVENOUS
  Filled 2018-09-20: qty 510

## 2018-09-20 NOTE — Discharge Instructions (Signed)

## 2018-09-22 NOTE — Telephone Encounter (Signed)
Opened in error

## 2018-09-27 ENCOUNTER — Other Ambulatory Visit: Payer: Self-pay

## 2018-09-27 ENCOUNTER — Ambulatory Visit (HOSPITAL_COMMUNITY)
Admission: RE | Admit: 2018-09-27 | Discharge: 2018-09-27 | Disposition: A | Discharge status: Home / Self Care | Payer: Medicaid Other | Source: Ambulatory Visit | Attending: Obstetrics & Gynecology | Admitting: Obstetrics & Gynecology

## 2018-09-27 DIAGNOSIS — O99012 Anemia complicating pregnancy, second trimester: Secondary | ICD-10-CM | POA: Diagnosis not present

## 2018-09-27 MED ORDER — SODIUM CHLORIDE 0.9 % IV SOLN
510.0000 mg | INTRAVENOUS | Status: DC
  Administered 2018-09-27: 510 mg via INTRAVENOUS
  Filled 2018-09-27: qty 510

## 2018-09-28 ENCOUNTER — Telehealth: Payer: Self-pay | Admitting: Family Medicine

## 2018-09-28 ENCOUNTER — Encounter: Payer: Self-pay | Admitting: *Deleted

## 2018-09-28 NOTE — Telephone Encounter (Signed)
Patient is aware of Virtual mychart visit.

## 2018-09-29 ENCOUNTER — Telehealth: Payer: Self-pay | Admitting: Obstetrics & Gynecology

## 2018-09-29 NOTE — Telephone Encounter (Signed)
Attempted to call patient about her appointment, and how she needed to have her MyChart app downloaded in order to have this visit. Voicemail not setup yet.

## 2018-09-30 ENCOUNTER — Other Ambulatory Visit: Payer: Self-pay

## 2018-09-30 ENCOUNTER — Telehealth (INDEPENDENT_AMBULATORY_CARE_PROVIDER_SITE_OTHER): Payer: Medicaid Other | Admitting: Obstetrics & Gynecology

## 2018-09-30 VITALS — BP 80/55 | HR 109 | Wt 104.5 lb

## 2018-09-30 DIAGNOSIS — Z3A18 18 weeks gestation of pregnancy: Secondary | ICD-10-CM

## 2018-09-30 DIAGNOSIS — O99019 Anemia complicating pregnancy, unspecified trimester: Secondary | ICD-10-CM

## 2018-09-30 DIAGNOSIS — K5080 Crohn's disease of both small and large intestine without complications: Secondary | ICD-10-CM

## 2018-09-30 DIAGNOSIS — O99012 Anemia complicating pregnancy, second trimester: Secondary | ICD-10-CM

## 2018-09-30 DIAGNOSIS — O0990 Supervision of high risk pregnancy, unspecified, unspecified trimester: Secondary | ICD-10-CM

## 2018-09-30 NOTE — Progress Notes (Signed)
I connected with  Whitney Lowe on 09/30/18 at 10:55 AM EDT by telephone and verified that I am speaking with the correct person using two identifiers.   I discussed the limitations, risks, security and privacy concerns of performing an evaluation and management service by telephone and the availability of in person appointments. I also discussed with the patient that there may be a patient responsible charge related to this service. The patient expressed understanding and agreed to proceed.  Dolores Hoose, RN 09/30/2018  10:49 AM

## 2018-09-30 NOTE — Progress Notes (Signed)
   [place] VIRTUAL VIDEO VISIT ENCOUNTER NOTE  Provider location: Center for [employer] at [facility]   I connected with Whitney Lowe on 09/30/18 at 10:55 AM EDT by MyChart Video Encounter at home and verified that I am speaking with the correct person using two identifiers.   I discussed the limitations, risks, security and privacy concerns of performing an evaluation and management service by telephone and the availability of in person appointments. I also discussed with the patient that there may be a patient responsible charge related to this service. The patient expressed understanding and agreed to proceed. Subjective:  Whitney Lowe is a 33 y.o. G3P0020 at 60w6dbeing seen today for ongoing prenatal care.  She is currently monitored for the following issues for this high-risk pregnancy and has Crohn's ileocolitis ([place]; Long-term use of immunosuppressant medication-Stelara; Pregnancy; Supervision of high risk pregnancy, antepartum; Rubella non-immune status, antepartum; Rh negative, antepartum; and Anemia affecting pregnancy, antepartum on their problem list.  Patient reports no complaints.  Contractions: Not present. Vag. Bleeding: None.  Movement: Absent. Denies any leaking of fluid.   The following portions of the patient's history were reviewed and updated as appropriate: allergies, current medications, past family history, past medical history, past social history, past surgical history and problem list.   Objective:   Vitals:   09/30/18 1050  BP: (!) 80/55  Pulse: (!) 109    Fetal Status:     Movement: Absent     General:  Alert, oriented and cooperative. Patient is in no acute distress.  Respiratory: Normal respiratory effort, no problems with respiration noted  Mental Status: Normal mood and affect. Normal behavior. Normal judgment and thought content.  Rest of physical exam deferred due to type of encounter  Imaging: No results found.   Assessment and Plan:  Pregnancy: [id number] 177w6d. Anemia affecting pregnancy, antepartum - fereheme transfusion x 2 this month  2. Supervision of high risk pregnancy, antepartum - MFM u/s tomorrow  3. Crohn's disease of both small and large intestine without complication ([place] - no recent flares, on stelara q 8 weeks  Preterm labor symptoms and general obstetric precautions including but not limited to vaginal bleeding, contractions, leaking of fluid and fetal movement were reviewed in detail with the patient. I discussed the assessment and treatment plan with the patient. The patient was provided an opportunity to ask questions and all were answered. The patient agreed with the plan and demonstrated an understanding of the instructions. The patient was advised to call back or seek an in-person office evaluation/[facility] or concerning [facility]. Please refer to After Visit Summary for other counseling recommendations.   I provided 10 minutes of face-to-face time during this encounter.  No follow-ups on file.  Future Appointments  Date Time Provider [place]6/03/2019  1:15 PM WH-MFC [place] WH-MFCUS MFC-US    Tasha Jindra C Masoud Nyce, MDGainesvilleor [facility] [place]

## 2018-10-01 ENCOUNTER — Other Ambulatory Visit: Payer: Self-pay | Admitting: Obstetrics and Gynecology

## 2018-10-01 ENCOUNTER — Ambulatory Visit (HOSPITAL_COMMUNITY)
Admission: RE | Admit: 2018-10-01 | Discharge: 2018-10-01 | Disposition: A | Discharge status: Home / Self Care | Payer: Medicaid Other | Source: Ambulatory Visit | Attending: Obstetrics and Gynecology | Admitting: Obstetrics and Gynecology

## 2018-10-01 DIAGNOSIS — O0990 Supervision of high risk pregnancy, unspecified, unspecified trimester: Secondary | ICD-10-CM | POA: Diagnosis not present

## 2018-10-01 DIAGNOSIS — O99012 Anemia complicating pregnancy, second trimester: Secondary | ICD-10-CM

## 2018-10-01 DIAGNOSIS — O360120 Maternal care for anti-D [Rh] antibodies, second trimester, not applicable or unspecified: Secondary | ICD-10-CM | POA: Diagnosis not present

## 2018-10-01 DIAGNOSIS — Z363 Encounter for antenatal screening for malformations: Secondary | ICD-10-CM | POA: Diagnosis not present

## 2018-10-01 DIAGNOSIS — Z3A19 19 weeks gestation of pregnancy: Secondary | ICD-10-CM

## 2018-10-01 DIAGNOSIS — O3412 Maternal care for benign tumor of corpus uteri, second trimester: Secondary | ICD-10-CM | POA: Diagnosis not present

## 2018-10-01 DIAGNOSIS — O99612 Diseases of the digestive system complicating pregnancy, second trimester: Secondary | ICD-10-CM | POA: Diagnosis not present

## 2018-10-04 ENCOUNTER — Inpatient Hospital Stay (HOSPITAL_COMMUNITY): Payer: Medicaid Other

## 2018-10-04 ENCOUNTER — Other Ambulatory Visit (HOSPITAL_COMMUNITY): Payer: Self-pay | Admitting: *Deleted

## 2018-10-04 ENCOUNTER — Encounter (HOSPITAL_COMMUNITY): Payer: Self-pay | Admitting: *Deleted

## 2018-10-04 ENCOUNTER — Other Ambulatory Visit: Payer: Self-pay

## 2018-10-04 ENCOUNTER — Inpatient Hospital Stay (HOSPITAL_COMMUNITY)
Admission: AD | Admit: 2018-10-04 | Discharge: 2018-10-05 | DRG: 832 | Disposition: A | Discharge status: Home / Self Care | Payer: Medicaid Other | Attending: Obstetrics and Gynecology | Admitting: Obstetrics and Gynecology

## 2018-10-04 DIAGNOSIS — O99612 Diseases of the digestive system complicating pregnancy, second trimester: Secondary | ICD-10-CM | POA: Diagnosis not present

## 2018-10-04 DIAGNOSIS — O26892 Other specified pregnancy related conditions, second trimester: Secondary | ICD-10-CM | POA: Diagnosis present

## 2018-10-04 DIAGNOSIS — O4592 Premature separation of placenta, unspecified, second trimester: Secondary | ICD-10-CM | POA: Diagnosis present

## 2018-10-04 DIAGNOSIS — Z6791 Unspecified blood type, Rh negative: Secondary | ICD-10-CM | POA: Diagnosis not present

## 2018-10-04 DIAGNOSIS — O3432 Maternal care for cervical incompetence, second trimester: Secondary | ICD-10-CM

## 2018-10-04 DIAGNOSIS — N72 Inflammatory disease of cervix uteri: Secondary | ICD-10-CM | POA: Diagnosis not present

## 2018-10-04 DIAGNOSIS — Z87891 Personal history of nicotine dependence: Secondary | ICD-10-CM

## 2018-10-04 DIAGNOSIS — O99012 Anemia complicating pregnancy, second trimester: Secondary | ICD-10-CM

## 2018-10-04 DIAGNOSIS — O3412 Maternal care for benign tumor of corpus uteri, second trimester: Secondary | ICD-10-CM

## 2018-10-04 DIAGNOSIS — D252 Subserosal leiomyoma of uterus: Secondary | ICD-10-CM | POA: Diagnosis present

## 2018-10-04 DIAGNOSIS — K5090 Crohn's disease, unspecified, without complications: Secondary | ICD-10-CM

## 2018-10-04 DIAGNOSIS — O4590 Premature separation of placenta, unspecified, unspecified trimester: Secondary | ICD-10-CM

## 2018-10-04 DIAGNOSIS — N888 Other specified noninflammatory disorders of cervix uteri: Secondary | ICD-10-CM | POA: Diagnosis present

## 2018-10-04 DIAGNOSIS — Z3A19 19 weeks gestation of pregnancy: Secondary | ICD-10-CM | POA: Diagnosis not present

## 2018-10-04 DIAGNOSIS — Z1159 Encounter for screening for other viral diseases: Secondary | ICD-10-CM | POA: Diagnosis not present

## 2018-10-04 DIAGNOSIS — D259 Leiomyoma of uterus, unspecified: Secondary | ICD-10-CM | POA: Diagnosis present

## 2018-10-04 DIAGNOSIS — O4692 Antepartum hemorrhage, unspecified, second trimester: Secondary | ICD-10-CM | POA: Diagnosis not present

## 2018-10-04 DIAGNOSIS — O360120 Maternal care for anti-D [Rh] antibodies, second trimester, not applicable or unspecified: Secondary | ICD-10-CM | POA: Diagnosis not present

## 2018-10-04 DIAGNOSIS — O209 Hemorrhage in early pregnancy, unspecified: Secondary | ICD-10-CM | POA: Diagnosis present

## 2018-10-04 DIAGNOSIS — O34599 Maternal care for other abnormalities of gravid uterus, unspecified trimester: Secondary | ICD-10-CM | POA: Diagnosis not present

## 2018-10-04 DIAGNOSIS — O3430 Maternal care for cervical incompetence, unspecified trimester: Secondary | ICD-10-CM

## 2018-10-04 DIAGNOSIS — O4693 Antepartum hemorrhage, unspecified, third trimester: Secondary | ICD-10-CM

## 2018-10-04 HISTORY — DX: Anxiety disorder, unspecified: F41.9

## 2018-10-04 HISTORY — DX: Unspecified infectious disease: B99.9

## 2018-10-04 HISTORY — DX: Anemia, unspecified: D64.9

## 2018-10-04 HISTORY — DX: Other complications of anesthesia, initial encounter: T88.59XA

## 2018-10-04 HISTORY — DX: Dermatitis, unspecified: L30.9

## 2018-10-04 HISTORY — DX: Attention-deficit hyperactivity disorder, unspecified type: F90.9

## 2018-10-04 HISTORY — DX: Unspecified ovarian cyst, unspecified side: N83.209

## 2018-10-04 LAB — URINALYSIS, ROUTINE W REFLEX MICROSCOPIC
Bacteria, UA: NONE SEEN
Bilirubin Urine: NEGATIVE
Glucose, UA: NEGATIVE mg/dL
Ketones, ur: NEGATIVE mg/dL
Leukocytes,Ua: NEGATIVE
Nitrite: NEGATIVE
Protein, ur: NEGATIVE mg/dL
Specific Gravity, Urine: 1.015 (ref 1.005–1.030)
pH: 7 (ref 5.0–8.0)

## 2018-10-04 LAB — CBC WITH DIFFERENTIAL/PLATELET
Abs Immature Granulocytes: 0.04 10*3/uL (ref 0.00–0.07)
Basophils Absolute: 0 10*3/uL (ref 0.0–0.1)
Basophils Relative: 0 %
Eosinophils Absolute: 0.2 10*3/uL (ref 0.0–0.5)
Eosinophils Relative: 3 %
HCT: 34.2 % — ABNORMAL LOW (ref 36.0–46.0)
Hemoglobin: 10.9 g/dL — ABNORMAL LOW (ref 12.0–15.0)
Immature Granulocytes: 1 %
Lymphocytes Relative: 29 %
Lymphs Abs: 2.1 10*3/uL (ref 0.7–4.0)
MCH: 29.1 pg (ref 26.0–34.0)
MCHC: 31.9 g/dL (ref 30.0–36.0)
MCV: 91.2 fL (ref 80.0–100.0)
Monocytes Absolute: 0.6 10*3/uL (ref 0.1–1.0)
Monocytes Relative: 8 %
Neutro Abs: 4.3 10*3/uL (ref 1.7–7.7)
Neutrophils Relative %: 59 %
Platelets: 337 10*3/uL (ref 150–400)
RBC: 3.75 MIL/uL — ABNORMAL LOW (ref 3.87–5.11)
RDW: 16.2 % — ABNORMAL HIGH (ref 11.5–15.5)
WBC: 7.3 10*3/uL (ref 4.0–10.5)
nRBC: 0 % (ref 0.0–0.2)

## 2018-10-04 LAB — KLEIHAUER-BETKE STAIN
# Vials RhIg: 1
Fetal Cells %: 0.1 %
Quantitation Fetal Hemoglobin: 5 mL

## 2018-10-04 LAB — SARS CORONAVIRUS 2: SARS Coronavirus 2: NOT DETECTED

## 2018-10-04 LAB — TYPE AND SCREEN
ABO/RH(D): B NEG
Antibody Screen: NEGATIVE

## 2018-10-04 MED ORDER — RHO D IMMUNE GLOBULIN 1500 UNIT/2ML IJ SOSY
300.0000 ug | PREFILLED_SYRINGE | Freq: Once | INTRAMUSCULAR | Status: AC
  Administered 2018-10-04: 300 ug via INTRAMUSCULAR
  Filled 2018-10-04: qty 2

## 2018-10-04 MED ORDER — ZOLPIDEM TARTRATE 5 MG PO TABS: 5.0000 mg | ORAL_TABLET | Freq: Every evening | ORAL | Status: DC | PRN

## 2018-10-04 MED ORDER — PRENATAL MULTIVITAMIN CH
1.0000 | ORAL_TABLET | Freq: Every day | ORAL | Status: DC
  Filled 2018-10-04: qty 1

## 2018-10-04 MED ORDER — ACETAMINOPHEN 325 MG PO TABS: 650.0000 mg | ORAL_TABLET | ORAL | Status: DC | PRN

## 2018-10-04 NOTE — Plan of Care (Signed)
Pt. Admitted to [facility] from MAU for vaginal bleeding and lower abd. Pain. IV started, Rhogam given, Pt. Pain free upon admission to [facility]. Will get MRI of pelvis this afternoon (next in line for procedure per MRI tech). Doppler of FHR 150.

## 2018-10-04 NOTE — MAU Note (Signed)
Dr Ilda Basset and Altamease Oiler NP in with pt, spec exam, biopsy obtained from what is protruding from cervix.  cx 2cm on digital exam by NP.  Spec sent to pathology

## 2018-10-04 NOTE — MAU Provider Note (Signed)
History     CSN: [id number]  Arrival date and time: 10/04/18 1779   First Provider Initiated Contact with Patient 10/04/18 1107      Chief Complaint  Patient presents with  . Vaginal Bleeding  . Abdominal Pain   HPI   [name] is a 33 y.o. female G3P0020 @[redacted]w[redacted]d  here in MAU with lower abdominal pain and light vaginal bleeding. Today she noticed a small amount of bright red blood on the toilet paper when she wiped. No recent intercourse, she did have a transvaginal US done on Friday. The pain has been off and on for a few days. The pain is cramp like and sharp. She has not taken any medications for the pain. No constipation.   OB History    Gravida  3   Para      Term      Preterm      AB  2   Living        SAB  1   TAB  1   Ectopic      Multiple      Live Births              Past Medical History:  Diagnosis Date  . ADHD    stopped abilify with preg  . ADHD (attention deficit hyperactivity disorder)   . Anemia   . Anxiety    not currently on meds  . Cataract    Rt eye  . Complication of anesthesia    became combative   . Crohn's ileocolitis ([place]) 06/19/2016  . Eczema   . Fibroids   . Infection    UTI  . Ovarian cyst     Past Surgical History:  Procedure Laterality Date  . COLONOSCOPY    . ESOPHAGOGASTRODUODENOSCOPY    . removal of cyst in ear     6th grade    Family History  Problem Relation Age of Onset  . Crohn's disease Mother        mom was adopted  . Heart disease Mother   . Anxiety disorder Mother   . Blindness Mother        Rt eye  . Diabetes Mellitus II Father   . COPD Father   . Hypertension Father   . Asthma Father   . ADD / ADHD Father   . Diabetes Father        type 2  . Anxiety disorder Father   . Depression Father   . Diabetes Sister        Type 2  . Colon cancer Neg Hx   . Esophageal cancer Neg Hx   . Pancreatic cancer Neg Hx   . Stomach cancer Neg Hx   . Liver disease Neg Hx     Social History    Tobacco Use  . Smoking status: Former Smoker    Types: Cigars, Cigarettes    Quit date: 04/21/2018    Years since quitting: 0.4  . Smokeless tobacco: Never Used  . Tobacco comment: social smoker  Substance Use Topics  . Alcohol use: Not Currently    Frequency: Never    Comment: socially when not pregnant  . Drug use: Not Currently    Types: Marijuana    Comment: last use february 2020    Allergies: No Known Allergies  Medications Prior to Admission  Medication Sig Dispense Refill Last Dose  . phenylephrine (,USE FOR PREPARATION-H,) 0.25 % suppository Place 1 suppository rectally 2 (two) times daily as  needed for hemorrhoids.   Past Week at Unknown time  . ARIPiprazole (ABILIFY) 5 MG tablet Take 2.5 mg by mouth daily.    09/23/2018  . lamoTRIgine (LAMICTAL PO) Take by mouth.     . Prenatal MV-Min-FA-Omega-3 (PRENATAL GUMMIES/DHA & FA PO) Take 2 each by mouth daily.   t  . STELARA 90 MG/ML SOSY injection ADMINISTER 1 ML(90 MG) UNDER THE SKIN EVERY 8 WEEKS 1 mL 2    Results for orders placed or performed during the hospital encounter of 10/04/18 (from the past 48 hour(s))  Urinalysis, Routine w reflex microscopic     Status: Abnormal   Collection Time: 10/04/18 10:54 AM  Result Value Ref Range   Color, Urine YELLOW YELLOW   APPearance CLEAR CLEAR   Specific Gravity, Urine 1.015 1.005 - 1.030   pH 7.0 5.0 - 8.0   Glucose, UA NEGATIVE NEGATIVE mg/dL   Hgb urine dipstick MODERATE (A) NEGATIVE   Bilirubin Urine NEGATIVE NEGATIVE   Ketones, ur NEGATIVE NEGATIVE mg/dL   Protein, ur NEGATIVE NEGATIVE mg/dL   Nitrite NEGATIVE NEGATIVE   Leukocytes,Ua NEGATIVE NEGATIVE   RBC / HPF 0-5 0 - 5 RBC/hpf   WBC, UA 0-5 0 - 5 WBC/hpf   Bacteria, UA NONE SEEN NONE SEEN   Squamous Epithelial / LPF 0-5 0 - 5   Mucus PRESENT     Comment: Performed at [facility], [place] [address] [place], [place] [address]  Rh IG workup (includes ABO/Rh)     Status: None (Preliminary result)    Collection Time: 10/04/18 12:18 PM  Result Value Ref Range   Gestational Age(Wks) 19    ABO/RH(D)      B NEG Performed at [facility] [address] [place], [place] [address]   CBC with Differential/Platelet     Status: Abnormal   Collection Time: 10/04/18 12:18 PM  Result Value Ref Range   WBC 7.3 4.0 - 10.5 K/uL   RBC 3.75 (L) 3.87 - 5.11 MIL/uL   Hemoglobin 10.9 (L) 12.0 - 15.0 g/dL   HCT 34.2 (L) 36.0 - 46.0 %   MCV 91.2 80.0 - 100.0 fL   MCH 29.1 26.0 - 34.0 pg   MCHC 31.9 30.0 - 36.0 g/dL   RDW 16.2 (H) 11.5 - 15.5 %   Platelets 337 150 - 400 K/uL   nRBC 0.0 0.0 - 0.2 %   Neutrophils Relative % 59 %   Neutro Abs 4.3 1.7 - 7.7 K/uL   Lymphocytes Relative 29 %   Lymphs Abs 2.1 0.7 - 4.0 K/uL   Monocytes Relative 8 %   Monocytes Absolute 0.6 0.1 - 1.0 K/uL   Eosinophils Relative 3 %   Eosinophils Absolute 0.2 0.0 - 0.5 K/uL   Basophils Relative 0 %   Basophils Absolute 0.0 0.0 - 0.1 K/uL   Immature Granulocytes 1 %   Abs Immature Granulocytes 0.04 0.00 - 0.07 K/uL    Comment: Performed at [facility], [address], [place], [place] [address]    Review of Systems  Gastrointestinal: Positive for abdominal pain. Negative for constipation.  Genitourinary: Positive for vaginal bleeding.   Physical Exam   Blood pressure 102/86, pulse (!) 106, temperature 98.2 F (36.8 C), temperature source Oral, resp. rate 18, weight 48.2 kg, last menstrual period 05/21/2018, SpO2 100 %.  Physical Exam  Constitutional: She is oriented to person, place, and time. She appears well-developed and well-nourished. No distress.  GI: Soft.  She exhibits no distension. There is no abdominal tenderness. There is no rebound.  Genitourinary:    Genitourinary Comments: Vagina - Small amount of pink vaginal discharge, no odor  Cervix -Cervix appears dilated 2 cm, protrusion of tissue ?fibroid.  Bimanual exam: 2 cm, long Chaperone present for exam.  Exam by: Noni Saupe, NP     Musculoskeletal: Normal range of motion.  Neurological: She is alert and oriented to person, place, and time.  Skin: Skin is warm. She is not diaphoretic.  Psychiatric: Her behavior is normal.   MAU Course  Procedures  None  MDM  + Fetal heart tones via doppler  CBC with diff UA  Needs Rhogam: ordered B negative  Dr. Ilda Basset down to MAU to see the patient.   Assessment and Plan   A:  1. [redacted] weeks gestation of pregnancy   2. Vaginal bleeding in pregnancy, third trimester   3. Premature dilation of cervix during pregnancy, antepartum     P:  Admit to Ante Biopsy collected and sent MRI ordered She needs Rhogam.    Lezlie Lye, NP 10/04/2018 2:26 PM

## 2018-10-04 NOTE — MAU Note (Signed)
Started having sharp pains on Sat in center of lower abd.  Hurts to lay down and sit. Today had some bright red blood.  No recent intercourse.

## 2018-10-04 NOTE — H&P (Signed)
Obstetrics Admission History & Physical  10/04/2018 - 1:38 PM Primary OBGYN: [facility]  Chief Complaint: abdominal pain, VB, cervical mass  History of Present Illness  33 y.o. [id number] @ [redacted]w[redacted]d with the above CC. Pregnancy complicated by: h/o Crohn's (currently on stelara), Rh negative.  Ms. SNadene Witherspoonstates that she's had some lower abdominal cramping for a few days and today noticed some VB/spotting early this morning.  Patient examined in MAU by APP and concern for cervical dilation/cervical mass. Patient had transabdominal anatomy u/s on 6/12 and that noted a normal cervical length of 3.68 and 3-4cm sized fibroids in the posterior, anterior right lateral and anterior aspect of the uterus, which is consistent with prior early ob u/s. She did have a CT a/p in march 2019 (see below).  U/s today still shows normal cx length and fibroids as before.   Review of Systems: as noted in the History of Present Illness.  PMHx:  Past Medical History:  Diagnosis Date  . ADHD    stopped abilify with preg  . ADHD (attention deficit hyperactivity disorder)   . Anemia   . Anxiety    not currently on meds  . Cataract    Rt eye  . Complication of anesthesia    became combative   . Crohn's ileocolitis ([place] 06/19/2016  . Eczema   . Fibroids   . Infection    UTI  . Ovarian cyst    PSHx:  Past Surgical History:  Procedure Laterality Date  . COLONOSCOPY    . ESOPHAGOGASTRODUODENOSCOPY    . removal of cyst in ear     6th grade   Medications:  Medications Prior to Admission  Medication Sig Dispense Refill Last Dose  . phenylephrine (,USE FOR PREPARATION-H,) 0.25 % suppository Place 1 suppository rectally 2 (two) times daily as needed for hemorrhoids.   Past Week at Unknown time  . ARIPiprazole (ABILIFY) 5 MG tablet Take 2.5 mg by mouth daily.    09/23/2018  . lamoTRIgine (LAMICTAL PO) Take by mouth.     . Prenatal MV-Min-FA-Omega-3 (PRENATAL  GUMMIES/DHA & FA PO) Take 2 each by mouth daily.   t  . STELARA 90 MG/ML SOSY injection ADMINISTER 1 ML(90 MG) UNDER THE SKIN EVERY 8 WEEKS 1 mL 2      Allergies: has No Known Allergies. OBHx:  OB History  Gravida Para Term Preterm AB Living  3       2    SAB TAB Ectopic Multiple Live Births  1 1          # Outcome Date GA Lbr Len/2nd Weight Sex Delivery Anes PTL Lv  3 Current           2 SAB 11/16/17          1 TAB 08/15/99              FHx:  Family History  Problem Relation Age of Onset  . Crohn's disease Mother        mom was adopted  . Heart disease Mother   . Anxiety disorder Mother   . Blindness Mother        Rt eye  . Diabetes Mellitus II Father   . COPD Father   . Hypertension Father   . Asthma Father   . ADD / ADHD Father   . Diabetes Father        type 2  . Anxiety disorder Father   . Depression Father   .  Diabetes Sister        Type 2  . Colon cancer Neg Hx   . Esophageal cancer Neg Hx   . Pancreatic cancer Neg Hx   . Stomach cancer Neg Hx   . Liver disease Neg Hx    Soc Hx:  Social History   Socioeconomic History  . Marital status: Married    Spouse name: Marqus Almond  . Number of children: 0  . Years of education: Not on file  . Highest education level: Not on file  Occupational History  . Occupation: Pharmacist, hospital    Comment: ESL  Social Needs  . Financial resource strain: Not on file  . Food insecurity    Worry: Never true    Inability: Never true  . Transportation needs    Medical: No    Non-medical: No  Tobacco Use  . Smoking status: Former Smoker    Types: Cigars, Cigarettes    Quit date: 04/21/2018    Years since quitting: 0.4  . Smokeless tobacco: Never Used  . Tobacco comment: social smoker  Substance and Sexual Activity  . Alcohol use: Not Currently    Frequency: Never    Comment: socially when not pregnant  . Drug use: Not Currently    Types: Marijuana    Comment: last use february 2020  . Sexual activity: Not Currently     Birth control/protection: None  Lifestyle  . Physical activity    Days per week: Not on file    Minutes per session: Not on file  . Stress: Not on file  Relationships  . Social Herbalist on phone: Not on file    Gets together: Not on file    Attends religious service: Not on file    Active member of club or organization: Not on file    Attends meetings of clubs or organizations: Not on file    Relationship status: Not on file  . Intimate partner violence    Fear of current or ex partner: No    Emotionally abused: No    Physically abused: No    Forced sexual activity: No  Other Topics Concern  . Not on file  Social History Narrative   Lives w/ fiance   Pregnant    BS psychology   [employer]    Objective    Current Vital Signs 24h Vital Sign Ranges  T 98.2 F (36.8 C) Temp  Avg: 98.2 F (36.8 C)  Min: 98.2 F (36.8 C)  Max: 98.2 F (36.8 C)  BP 102/86 BP  Min: 102/86  Max: 102/86  HR (!) 106 Pulse  Avg: 113  Min: 106  Max: 120  RR 18 Resp  Avg: 18  Min: 18  Max: 18  SaO2 100 %   SpO2  Avg: 100 %  Min: 100 %  Max: 100 %       24 Hour I/O Current Shift I/O  Time Ins Outs No intake/output data recorded. No intake/output data recorded.   FHTs: normal  General: Well nourished, well developed female in no acute distress.  Skin:  Warm and dry.  Respiratory:  Normal respiratory effort Abdomen: gravid nttp Neuro/Psych:  Normal mood and affect.   SSE: no VB in the vault, scant pink discharge. Cervix visualized and hard mass dilating the cervix to approximately 1.5-2cm, no active bleeding-->mass biopsied with pickups with teeth and hemostasis via holding pressure.  Labs  Recent Labs  Lab 10/04/18 1218  WBC 7.3  HGB 10.9*  HCT 34.2*  PLT 337    Radiology Prelim ultrasound (transabdominal)  today: cervix 4.22cm, right anterior 3-4.4cm, anterior 2-3cm, posterior 3-4.6cm  Cervix Uterus Adnexa  Cervix  Length:           3.68  cm.  Normal  appearance by transabdominal scan.  Left Ovary  Within normal limits.  Right Ovary  Not visualized.  Adnexa  No abnormality visualized. ---------------------------------------------------------------------- Myomas   Site                     L(cm)      W(cm)      D(cm)      Location   Posterior                3.61       3.55       4.68   Anterior Right Lateral   4.01       4.36       3.17   Anterior                 2.59       1.91       2.44  ----------------------------------------------------------------------   Blood Flow                 RI        PI       Comments  ---------------------------------------------------------------------- Impression  Maternal Crohn's disasese with no flares. Stable. Recent  blood transfusions because of anemia.  We performed fetal anatomy scan. No makers of  aneuploidies or fetal structural defects are seen. Fetal  biometry is consistent with her previously-established dates.  Amniotic fluid is normal and good fetal activity is seen.  Patient understands the limitations of ultrasound in detecting  fetal anomalies.  Small intramural myomas are seen (above).  On cell-free fetal DNA screening, the risks of fetal  aneuploidies are not increased. ---------------------------------------------------------------------- Recommendations  An appointment was made for her to return in 4 weeks for  fetal growth assessment. ----------------------------------------------------------------------                  Tama High, MD Electronically Signed Final Report   10/01/2018 03:00 pm  CLINICAL DATA:  First trimester pregnancy. Abdominal pain with palpable mass in the right lower quadrant. LMP 05/20/2018.  EXAM: OBSTETRIC <14 WK [place] AND TRANSVAGINAL OB US  TECHNIQUE: Both transabdominal and transvaginal ultrasound examinations were performed for complete evaluation of the gestation as well as the maternal uterus, adnexal regions, and pelvic  cul-de-sac. Transvaginal technique was performed to assess early pregnancy.  COMPARISON:  Pelvic CT 06/26/2017.  FINDINGS: Intrauterine gestational sac: Visualized/normal in shape.  Yolk sac:  Not visualized.  Embryo:  Visualized.  Cardiac Activity: Visualized.  Heart Rate: 168 bpm  CRL:  68.1 mm; 13 w 1 d;                  [place] EDC: 02/19/2019  Subchorionic hemorrhage: None.  Maternal uterus/adnexae: Both maternal ovaries are visualized. No adnexal mass or significant free pelvic fluid. There are several maternal uterine fibroids. At least 4 fibroids are seen, measuring up to 5.1 x 4.6 x 5.0 cm along the posterior wall. These were visible on prior CT.  IMPRESSION: 1. Single live intrauterine pregnancy with best estimated gestational age of [redacted] weeks 1 day. 2. No fetal or placental abnormalities identified. 3. Multiple maternal uterine fibroids.   Electronically Signed   By: Caryl Comes.D.  On: 08/15/2018 17:09  CLINICAL DATA:  Bloody stool  EXAM: CT ABDOMEN AND PELVIS WITH CONTRAST  TECHNIQUE: Multidetector CT imaging of the abdomen and pelvis was performed using the standard protocol following bolus administration of intravenous contrast.  CONTRAST:  64m ISOVUE-300 IOPAMIDOL (ISOVUE-300) INJECTION 61%  COMPARISON:  None.  FINDINGS: Lower chest: No acute abnormality.  Hepatobiliary: Probable focal fat infiltration near the falciform ligament. No calcified gallstones or biliary dilatation  Pancreas: Unremarkable. No pancreatic ductal dilatation or surrounding inflammatory changes.  Spleen: Normal in size without focal abnormality.  Adrenals/Urinary Tract: Adrenal glands are unremarkable. Kidneys are normal, without renal calculi, focal lesion, or hydronephrosis. Bladder is unremarkable.  Stomach/Bowel: Stomach is nonenlarged. No dilated small bowel. Wall thickening and mucosal enhancement involving long segment  of distal/terminal ileum. Diffuse wall thickening of the colon with mucosal enhancement.  Vascular/Lymphatic: Nonaneurysmal aorta. No significantly enlarged lymph nodes  Reproductive: Uterus contains multiple enhancing masses, for example right fundal mass measures 3 cm, right posterior myometrial exophytic mass measures 3 cm. No adnexal mass.  Other: Negative for free air or free fluid.  Musculoskeletal: No acute or significant osseous findings.  IMPRESSION: 1. Long segment wall thickening and mucosal enhancement involving the distal/terminal ileum with diffuse wall thickening and mucosal enhancement of the colon, consistent with ileitis and colitis, in keeping with the history of inflammatory bowel disease. No obstruction identified. 2. Fibroid uterus   Electronically Signed   By: KDonavan FoilM.D.   On: 06/26/2017 03:43  Assessment & Plan   33y.o. [id number]@ 145w3dith abdominal cramping, scant VB, concern for prolapsing fibroid. Pt stable *Pregnancy: normal FHTs. Dopplers qshift *Cervical mass: pt states she didn't have pelvic exam at new OB visit (note states her pelvic exam showed a nullip cervix and was closed and long) and visit after that was a virtual, and it looks like pap was requested from her 2019 GYN provider. Regardless, she has an endocervical mass that looks like a fibroid that may be new. Follow up biopsy. I told her that I recommend observing her until tomorrow and doing an MRI to see exactly what is going on. I told her that if it is a prolapsing fibroid that I'll touch base with MFM but may not be able to offer any intervention and her current situation puts her high at having a pregnancy loss. I told her that the pregnancy is too early to offer anything for the fetus and our primary concern right now is her safety.  *Rh neg: rhogam and KB ordered *GI: no current issues.  *Analgesia: no current needs *FEN/GI: SLIV, regular diet *PPx: SCDs *Dispo: see  above.   ChDurene RomansD Attending [facility]

## 2018-10-05 DIAGNOSIS — O4590 Premature separation of placenta, unspecified, unspecified trimester: Secondary | ICD-10-CM | POA: Diagnosis present

## 2018-10-05 LAB — RH IG WORKUP (INCLUDES ABO/RH)
ABO/RH(D): B NEG
Antibody Screen: NEGATIVE
Gestational Age(Wks): 19
Unit division: 0

## 2018-10-05 MED ORDER — DOCUSATE SODIUM 100 MG PO CAPS
100.0000 mg | ORAL_CAPSULE | Freq: Two times a day (BID) | ORAL | Status: DC
  Administered 2018-10-05: 100 mg via ORAL
  Filled 2018-10-05: qty 1

## 2018-10-05 MED ORDER — DOCUSATE SODIUM 100 MG PO CAPS: 100.0000 mg | ORAL_CAPSULE | Freq: Every day | ORAL | Status: DC

## 2018-10-05 NOTE — Discharge Planning (Signed)
Reviewed all paperwork with patient and SO regarding discharge. All questions answered, appointments verified. IV removed, VSS. Pt. Ready to leave, will call when ready to leave unit.

## 2018-10-05 NOTE — Plan of Care (Signed)
Pt. Doing well this AM, no reports of new vaginal bleeding or discharge. No sharp lower abd. Pain. Will d/c later today.

## 2018-10-05 NOTE — Discharge Instructions (Signed)
Placental Abruption  Placental abruption is a condition in which the placenta partly or completely separates from the uterus before the baby is born. The placenta is the organ that nourishes the unborn baby (fetus). The baby gets his or her blood supply and nutrients through the placenta. It is the babys life support system. The placenta is attached to the inside of the uterus until after the baby is born. Placental abruption is rare, but it can happen any time after 20 weeks of pregnancy. A small separation may not cause problems, but a large separation may be dangerous for you and your baby. A large separation is usually an emergency. It requires treatment right away. What are the causes? In most cases, the cause of this condition is not known. What increases the risk? This condition is more likely to develop in women who:  Have experienced a recent trauma such as a fall, an abdominal injury, or a car accident.  Have a previous placental abruption.  Have high blood pressure (hypertension).  Smoke cigarettes, use alcohol, or use illegal drugs such as cocaine.  Have blood clotting problems.  Experience preterm premature rupture of membranes (PPROM).  Have multiples (twins, triplets, or more).  Have had children before.  Are 34 years of age or older. What are the signs or symptoms? Symptoms of this condition can vary from mild to severe. A small placental abruption may not cause symptoms, or it may cause mild symptoms, which may include:  Mild abdominal pain or lower back pain.  Slight vaginal bleeding. A severe placental abruption will cause symptoms. The symptoms will depend on the size of the separation and the stage of pregnancy. They may include:  Abdominal pain or lower back pain.  Vaginal bleeding.  Tender and hard uterus.  Severe abdominal pain with tenderness.  Continual contractions of your uterus.  Weakness and light-headedness. How is this diagnosed? This  condition may be diagnosed based on:  Your symptoms.  A physical exam.  Ultrasound.  Blood work. This will be done to make sure that there are enough healthy red blood cells and that there are no clotting problems or signs of too much blood loss. How is this treated? Treatment for placental abruption depends on the severity of the condition. For mild cases, treatment may involve monitoring your condition and managing your symptoms. This may involve:  Bed rest and close observation. For more severe cases, emergency treatment is needed. This may involve:  Staying in the hospital until you and your baby are stabilized.  Cesarean delivery of your baby.  A blood transfusion or other fluids given through an IV tube.  Other treatments, depending on: ? The amount of bleeding you have. ? Whether you or your baby are in distress. ? The stage of your pregnancy. ? The maturity of the baby. Follow these instructions at home:  Take over-the-counter and prescription medicines only as told by your health care provider. Do not take any medicines that your health care provider has not approved.  Arrange for help at home before and after you deliver your baby, especially if you had a cesarean delivery or if you lost a lot of blood.  Get plenty of rest and sleep.  Do not use illegal drugs.  Do not drink alcohol.  Do not have sexual intercourse until your health care provider says it is okay.  Do not use tampons or douche unless your health care provider says it is okay.  Do not use any products  that contain nicotine or tobacco, such as cigarettes and e-cigarettes. If you need help quitting, ask your health care provider. Get help right away if:  You have vaginal bleeding or spotting.  You have any type of trauma, such as a fall, abdominal trauma, or a car accident.  You have abdominal pain.  You have continuous uterine contractions.  You have a hard, tender uterus.  You do not  feel the baby move, or the baby moves very little. This information is not intended to replace advice given to you by your health care provider. Make sure you discuss any questions you have with your health care provider. Document Released: 04/07/2005 Document Revised: 12/06/2015 Document Reviewed: 10/28/2015 Elsevier Interactive Patient Education  2019 [employer].

## 2018-10-05 NOTE — Discharge Summary (Signed)
Discharge Summary   Admit Date: 10/04/2018 Discharge Date: 10/05/2018 Discharging Service: Antepartum  Primary OBGYN: [facility] Admitting Physician: Aletha Halim, MD  Discharge Physician: Ilda Basset  Referring Provider: Maternity Admission Unit  Primary Care Provider: Patient, No Pcp Per  Admission Diagnoses: *Pregnancy at 19/3 *Abdominal Pain *Vaginal bleeding *Cervical dilation @ 2cm *?prolpasing fibroid *Rh negative *Crohn's  Discharge Diagnoses: *Pregnancy at 19/4 *Likely placental abruption *Rh negative *Crohn's  Consult Orders: None   Surgeries/Procedures Performed: Ultrasound Rhogam  History and Physical: Obstetrics Admission History & Physical  10/04/2018 - 1:38 PM [facility]YN: [facility]  Chief Complaint: abdominal pain, VB, cervical mass  History of Present Illness  33 y.o. [id number] @ [redacted]w[redacted]d with the above CC. Pregnancy complicated by: h/o Crohn's (currently on stelara), Rh negative.  Ms. SArlean Thiesstates that she's had some lower abdominal cramping for a few days and today noticed some VB/spotting early this morning.  Patient examined in MAU by APP and concern for cervical dilation/cervical mass. Patient had transabdominal anatomy u/s on 6/12 and that noted a normal cervical length of 3.68 and 3-4cm sized fibroids in the posterior, anterior right lateral and anterior aspect of the uterus, which is consistent with prior early ob u/s. She did have a CT a/p in march 2019 (see below).  U/s today still shows normal cx length and fibroids as before.   Review of Systems: as noted in the History of Present Illness.  PMHx:      Past Medical History:  Diagnosis Date  . ADHD    stopped abilify with preg  . ADHD (attention deficit hyperactivity disorder)   . Anemia   . Anxiety    not currently on meds  . Cataract    Rt eye  . Complication of anesthesia     became combative   . Cro[place]'s ileocolitis ([place] 06/19/[place]  . Eczema   . Fibroids   . Infection    UTI  . Ovarian cyst    PSHx:       Past Surgical History:  Procedure Laterality Date  . COLONOSCOPY    . ESOPHAGOGASTRODUODENOSCOPY    . removal of cyst in ear     6th grade   Medications:         Medications Prior to Admission  Medication Sig Dispense Refill Last Dose  . phenylephrine (,USE FOR PREPARATION-H,) 0.25 % suppository Place 1 suppository rectally 2 (two) times daily as needed for hemorrhoids.   Past Week at Unknown time  . ARIPiprazole (ABILIFY) 5 MG tablet Take 2.5 mg by mouth daily.    09/23/2018  . lamoTRIgine (LAMICTAL PO) Take by mouth.     . Prenatal MV-Min-FA-Omega-3 (PRENATAL GUMMIES/DHA & FA PO) Take 2 each by mouth daily.   t  . STELARA 90 MG/ML SOSY injection ADMINISTER 1 ML(90 MG) UNDER THE SKIN EVERY 8 WEEKS 1 mL 2      Allergies: has No Known Allergies. OBHx:                  OB History  Gravida Para Term Preterm AB Living  3       2    SAB TAB Ectopic Multiple Live Births     1 1             # Outcome Date GA Lbr Len/2nd Weight Sex Delivery Anes PTL Lv  3 Current           2 SAB  11/16/17          1 TAB 08/15/99              FHx:       Family History  Problem Relation Age of Onset  . Crohn's disease Mother        mom was adopted  . Heart disease Mother   . Anxiety disorder Mother   . Blindness Mother        Rt eye  . Diabetes Mellitus II Father   . COPD Father   . Hypertension Father   . Asthma Father   . ADD / ADHD Father   . Diabetes Father        type 2  . Anxiety disorder Father   . Depression Father   . Diabetes Sister        Type 2  . Colon cancer Neg Hx   . Esophageal cancer Neg Hx   . Pancreatic cancer Neg Hx   . Stomach cancer Neg Hx   . Liver disease Neg Hx    Soc Hx:  Social History        Socioeconomic History  . Marital status: Married     Spouse name: Marqus Zuckerman  . Number of children: 0  . Years of education: Not on file  . Highest education level: Not on file  Occupational History  . Occupation: Pharmacist, hospital    Comment: ESL  Social Needs  . Financial resource strain: Not on file  . Food insecurity    Worry: Never true    Inability: Never true  . Transportation needs    Medical: No    Non-medical: No  Tobacco Use  . Smoking status: Former Smoker    Types: Cigars, Cigarettes    Quit date: 04/21/2018    Years since quitting: 0.4  . Smokeless tobacco: Never Used  . Tobacco comment: social smoker  Substance and Sexual Activity  . Alcohol use: Not Currently    Frequency: Never    Comment: socially when not pregnant  . Drug use: Not Currently    Types: Marijuana    Comment: last use february 2020  . Sexual activity: Not Currently    Birth control/protection: None  Lifestyle  . Physical activity    Days per week: Not on file    Minutes per session: Not on file  . Stress: Not on file  Relationships  . Social Herbalist on phone: Not on file    Gets together: Not on file    Attends religious service: Not on file    Active member of club or organization: Not on file    Attends meetings of clubs or organizations: Not on file    Relationship status: Not on file  . Intimate partner violence    Fear of current or ex partner: No    Emotionally abused: No    Physically abused: No    Forced sexual activity: No  Other Topics Concern  . Not on file  Social History Narrative   Lives w/ fiance   Pregnant    BS psychology   [employer]    Objective    Current Vital Signs 24h Vital Sign Ranges  T 98.2 F (36.8 C) Temp  Avg: 98.2 F (36.8 C)  Min: 98.2 F (36.8 C)  Max: 98.2 F (36.8 C)  BP 102/86 BP  Min: 102/86  Max: 102/86  HR (!) 106 Pulse  Avg: 113  Min: 106  Max: 120  RR 18 Resp  Avg: 18  Min: 18  Max: 18  SaO2 100 %   SpO2   Avg: 100 %  Min: 100 %  Max: 100 %       24 Hour I/O Current Shift I/O  Time Ins Outs No intake/output data recorded. No intake/output data recorded.   FHTs: normal  General: Well nourished, well developed female in no acute distress.  Skin:  Warm and dry.  Respiratory:  Normal respiratory effort Abdomen: gravid nttp Neuro/Psych:  Normal mood and affect.   SSE: no VB in the vault, scant pink discharge. Cervix visualized and hard mass dilating the cervix to approximately 1.5-2cm, no active bleeding-->mass biopsied with pickups with teeth and hemostasis via holding pressure.  Labs  Last Labs      Recent Labs  Lab 10/04/18 1218  WBC 7.3  HGB 10.9*  HCT 34.2*  PLT 337      Radiology Prelim ultrasound (transabdominal)  today: cervix 4.22cm, right anterior 3-4.4cm, anterior 2-3cm, posterior 3-4.6cm  Cervix Uterus Adnexa Cervix Length: 3.68 cm. Normal appearance by transabdominal scan. Left Ovary Within normal limits. Right Ovary Not visualized. Adnexa No abnormality visualized. ---------------------------------------------------------------------- Myomas Site L(cm) W(cm) D(cm) Location Posterior 3.61 3.55 4.68 Anterior Right Lateral 4.01 4.36 3.17 Anterior 2.59 1.91 2.44 ---------------------------------------------------------------------- Blood Flow RI PI Comments ---------------------------------------------------------------------- Impression Maternal Crohn's disasese with no flares. Stable. Recent blood transfusions because of anemia. We performed fetal anatomy scan. No makers of aneuploidies or fetal structural defects are seen. Fetal biometry is consistent with her previously-established dates. Amniotic fluid is normal and good fetal activity is seen. Patient understands the  limitations of ultrasound in detecting fetal anomalies. Small intramural myomas are seen (above). On cell-free fetal DNA screening, the risks of fetal aneuploidies are not increased. ---------------------------------------------------------------------- Recommendations An appointment was made for her to return in 4 weeks for fetal growth assessment. ---------------------------------------------------------------------- Tama High, MD Electronically Signed Final Report 10/01/2018 03:00 pm  CLINICAL DATA: First trimester pregnancy. Abdominal pain with palpable mass in the right lower quadrant. LMP 05/20/2018.  EXAM: OBSTETRIC <14 WK [place] AND TRANSVAGINAL OB US  TECHNIQUE: Both transabdominal and transvaginal ultrasound examinations were performed for complete evaluation of the gestation as well as the maternal uterus, adnexal regions, and pelvic cul-de-sac. Transvaginal technique was performed to assess early pregnancy.  COMPARISON: Pelvic CT 06/26/2017.  FINDINGS: Intrauterine gestational sac: Visualized/normal in shape.  Yolk sac: Not visualized.  Embryo: Visualized.  Cardiac Activity: Visualized.  Heart Rate: 168 bpm  CRL: 68.1 mm; 13 w 1 d; [place] EDC: 02/19/2019  Subchorionic hemorrhage: None.  Maternal uterus/adnexae: Both maternal ovaries are visualized. No adnexal mass or significant free pelvic fluid. There are several maternal uterine fibroids. At least 4 fibroids are seen, measuring up to 5.1 x 4.6 x 5.0 cm along the posterior wall. These were visible on prior CT.  IMPRESSION: 1. Single live intrauterine pregnancy with best estimated gestational age of [redacted] weeks 1 day. 2. No fetal or placental abnormalities identified. 3. Multiple maternal uterine fibroids.   Electronically Signed By: Richardean Sale M.D. On: 08/15/2018 17:09  CLINICAL DATA: Bloody stool  EXAM: CT ABDOMEN AND PELVIS  WITH CONTRAST  TECHNIQUE: Multidetector CT imaging of the abdomen and pelvis was performed using the standard protocol following bolus administration of intravenous contrast.  CONTRAST: 70m ISOVUE-300 IOPAMIDOL (ISOVUE-300) INJECTION 61%  COMPARISON: None.  FINDINGS: Lower chest: No acute abnormality.  Hepatobiliary: Probable focal fat infiltration near the falciform ligament. No calcified gallstones or  biliary dilatation  Pancreas: Unremarkable. No pancreatic ductal dilatation or surrounding inflammatory changes.  Spleen: Normal in size without focal abnormality.  Adrenals/Urinary Tract: Adrenal glands are unremarkable. Kidneys are normal, without renal calculi, focal lesion, or hydronephrosis. Bladder is unremarkable.  Stomach/Bowel: Stomach is nonenlarged. No dilated small bowel. Wall thickening and mucosal enhancement involving long segment of distal/terminal ileum. Diffuse wall thickening of the colon with mucosal enhancement.  Vascular/Lymphatic: Nonaneurysmal aorta. No significantly enlarged lymph nodes  Reproductive: Uterus contains multiple enhancing masses, for example right fundal mass measures 3 cm, right posterior myometrial exophytic mass measures 3 cm. No adnexal mass.  Other: Negative for free air or free fluid.  Musculoskeletal: No acute or significant osseous findings.  IMPRESSION: 1. Long segment wall thickening and mucosal enhancement involving the distal/terminal ileum with diffuse wall thickening and mucosal enhancement of the colon, consistent with ileitis and colitis, in keeping with the history of inflammatory bowel disease. No obstruction identified. 2. Fibroid uterus   Electronically Signed By: Donavan Foil M.D. On: 06/26/2017 03:43  Assessment & Plan   33 y.o. [id number] @ 109w3dwith abdominal cramping, scant VB, concern for prolapsing fibroid. Pt stable *Pregnancy: normal FHTs. Dopplers qshift *Cervical mass:  pt states she didn't have pelvic exam at new OB visit (note states her pelvic exam showed a nullip cervix and was closed and long) and visit after that was a virtual, and it looks like pap was requested from her 2019 GYN provider. Regardless, she has an endocervical mass that looks like a fibroid that may be new. Follow up biopsy. I told her that I recommend observing her until tomorrow and doing an MRI to see exactly what is going on. I told her that if it is a prolapsing fibroid that I'll touch base with MFM but may not be able to offer any intervention and her current situation puts her high at having a pregnancy loss. I told her that the pregnancy is too early to offer anything for the fetus and our primary concern right now is her safety.  *Rh neg: rhogam and KB ordered *GI: no current issues.  *Analgesia: no current needs *FEN/GI: SLIV, regular diet *PPx: SCDs *Dispo: see above.   CDurene RomansMD Attending [facility]         Electronically signed by PAletha Halim MD at 10/04/2018 1:58 PM   Hospital Course: Patient was admitted for an MRI and observation. MRI showed: CLINICAL DATA:  Endocervical mass on physical exam. Nineteen weeks pregnant. Known fibroids  EXAM: MRI PELVIS WITHOUT CONTRAST  TECHNIQUE: Multiplanar multisequence MR imaging of the pelvis was performed. No intravenous contrast was administered.  COMPARISON:  Ultrasound 10/04/2018  FINDINGS: Gravid uterus with grossly normal fetus. Normal appearance of the placenta. Grossly normal amniotic fluid volume.  Numerous myometrial and serosal fibroids are noted throughout the uterus.  The cervix is long, measuring 4.7 cm. Prominent homogeneous cervical mucosa somewhat prolapsing avid of the external os. The cervical stroma is intact. A few small nabothian cysts are noted. No endocervical mass is identified. No evidence of a prolapsing fibroid or  endometrial or cervical polyps.  Both ovaries appear normal.  No pelvic adenopathy. No free pelvic fluid collections. No inguinal mass or adenopathy.  IMPRESSION: 1. Prominent but homogeneous cervical mucosa without evidence for endocervical mass. 2. Gravid uterus with grossly normal fetus. 3. Numerous uterine fibroids. 4. Normal appearing placenta.   Electronically Signed   By: PMarijo SanesM.D.   On: 10/05/2018  08:30  Surgical biopsy showed SCANT INFLAMED FIBROUS TISSUE; no malignancy shown.   On day of discharge, patient wasn't having any pain or bleeding and was fine for discharge. I told her it doesn't look like her cervix is dilated and there isn't a prolapsing fibroid and is just bulky cervical tissue. The main office requested at her NOB visit  KB showed ?abruption with 0.1% fetal cells and she was given rhogam on 6/15  Discharge Exam:    Current Vital Signs 24h Vital Sign Ranges  T 98.2 F (36.8 C) Temp  Avg: 98.3 F (36.8 C)  Min: 98.2 F (36.8 C)  Max: 98.5 F (36.9 C)  BP (Patient was eating) BP  Min: 88/55  Max: 105/70  HR 88 Pulse  Avg: 101.4  Min: 88  Max: 120  RR 18 Resp  Avg: 18  Min: 18  Max: 18  SaO2 100 % Room Air SpO2  Avg: 99.8 %  Min: 99 %  Max: 100 %       24 Hour I/O Current Shift I/O  Time Ins Outs No intake/output data recorded. No intake/output data recorded.   General appearance: Well nourished, well developed female in no acute distress.  Neck:  Supple, normal appearance, and no thyromegaly  Cardiovascular: S1, S2 normal, no murmur, rub or gallop, regular rate and rhythm Respiratory:  Clear to auscultation bilateral. Normal respiratory effort Abdomen: gravid, nttp Neuro/Psych:  Normal mood and affect.  Skin:  Warm and dry.   Discharge Disposition:  Home  Patient Instructions:  Standard   Results Pending at Discharge:  None  Discharge Medications: Allergies as of 10/05/2018   No Known Allergies     Medication List     TAKE these medications   phenylephrine 0.25 % suppository Commonly known as: (USE for PREPARATION-H) Place 1 suppository rectally 2 (two) times daily as needed for hemorrhoids.   PRENATAL GUMMIES/DHA & FA PO Take 2 each by mouth daily.   Stelara 90 MG/ML Sosy injection Generic drug: ustekinumab ADMINISTER 1 ML(90 MG) UNDER THE SKIN EVERY 8 WEEKS What changed: See the new instructions.        Future Appointments  Date Time Provider [place]  10/28/2018  1:55 PM Nicolette Bang, DO [place]  11/05/2018  2:30 PM [place] 1 [facility]-M[facility]US M[facility]-US   Inbasket message sent to clinic to move up appointment to sometime in the next 7-10 days  Durene Romans. MD Attending [facility])

## 2018-10-12 ENCOUNTER — Ambulatory Visit (INDEPENDENT_AMBULATORY_CARE_PROVIDER_SITE_OTHER): Payer: Medicaid Other | Admitting: Obstetrics and Gynecology

## 2018-10-12 ENCOUNTER — Telehealth: Payer: Self-pay | Admitting: General Practice

## 2018-10-12 ENCOUNTER — Other Ambulatory Visit: Payer: Self-pay

## 2018-10-12 VITALS — BP 101/67 | HR 112 | Temp 97.9°F | Wt 109.2 lb

## 2018-10-12 DIAGNOSIS — D259 Leiomyoma of uterus, unspecified: Secondary | ICD-10-CM

## 2018-10-12 DIAGNOSIS — O26892 Other specified pregnancy related conditions, second trimester: Secondary | ICD-10-CM

## 2018-10-12 DIAGNOSIS — Z3A20 20 weeks gestation of pregnancy: Secondary | ICD-10-CM

## 2018-10-12 DIAGNOSIS — O26899 Other specified pregnancy related conditions, unspecified trimester: Secondary | ICD-10-CM

## 2018-10-12 DIAGNOSIS — O360920 Maternal care for other rhesus isoimmunization, second trimester, not applicable or unspecified: Secondary | ICD-10-CM

## 2018-10-12 DIAGNOSIS — O99612 Diseases of the digestive system complicating pregnancy, second trimester: Secondary | ICD-10-CM

## 2018-10-12 DIAGNOSIS — Z6791 Unspecified blood type, Rh negative: Secondary | ICD-10-CM

## 2018-10-12 DIAGNOSIS — N888 Other specified noninflammatory disorders of cervix uteri: Secondary | ICD-10-CM

## 2018-10-12 DIAGNOSIS — K5080 Crohn's disease of both small and large intestine without complications: Secondary | ICD-10-CM

## 2018-10-12 DIAGNOSIS — O3412 Maternal care for benign tumor of corpus uteri, second trimester: Secondary | ICD-10-CM

## 2018-10-12 DIAGNOSIS — O0990 Supervision of high risk pregnancy, unspecified, unspecified trimester: Secondary | ICD-10-CM

## 2018-10-12 NOTE — Progress Notes (Signed)
Prenatal Visit Note-Inpatient Follow Up Visit Date: 10/12/2018 [facility]: [facility]  Subjective:  Whitney Lowe is a 33 y.o. G3P0020 at 49w4dbeing seen today for ongoing prenatal care.  She is currently monitored for the following issues for this high-risk pregnancy and has Crohn's ileocolitis ([place]; Long-term use of immunosuppressant medication-Stelara; Supervision of high risk pregnancy, antepartum; Rubella non-immune status, antepartum; Rh negative, antepartum; Anemia affecting pregnancy, antepartum; Cervical mass; Fibroid uterus; and Antepartum placental abruption on their problem list.  Patient reports has occasional brown spotting and ruq/right sided uterine discomfort, thinks it's fibroid related.   Contractions: Not present. Vag. Bleeding: Scant.  Movement: Present. Denies leaking of fluid.   The following portions of the patient's history were reviewed and updated as appropriate: allergies, current medications, past family history, past medical history, past social history, past surgical history and problem list. Problem list updated.  Objective:   Vitals:   10/12/18 0921  BP: 101/67  Pulse: (!) 112  Temp: 97.9 F (36.6 C)  Weight: 109 lb 3.2 oz (49.5 kg)    Fetal Status: Fetal Heart Rate (bpm): 147   Movement: Present     General:  Alert, oriented and cooperative. Patient is in no acute distress.  Skin: Skin is warm and dry. No rash noted.   Cardiovascular: Normal heart rate noted  Respiratory: Normal respiratory effort, no problems with respiration noted  Abdomen: Soft, gravid, appropriate for gestational age. Pain/Pressure: Present    nttp  Pelvic:  Cervical exam performed        EGBUS normal Vault: scant yellowish discharge Cervix: closed, long; when I went to soak up d/c in posterior fornix, tissue on right barely grazed with large swab and it was very friable and started bleeding. Silver nitrate applied  Extremities: Normal range of motion.   Edema: None  Mental Status: Normal mood and affect. Normal behavior. Normal judgment and thought content.   Urinalysis:      Assessment and Plan:  Pregnancy: G3P0020 at 2106w4d1. Cervical mass Today mass looks more like cx polyp. Likely cause of bleeding and hopefully not uterine  2. Rh negative, antepartum S/p rhogam on hospital admission on 6/15  3. Supervision of high risk pregnancy, antepartum Has 7/17 f/u u/s. Continue with close follow up  4. Uterine leiomyoma, unspecified location Continue with u/s surveillance. D/w her that if pain comes back and is intense to come in for eval to MAU  5. Crohn's disease of both small and large intestine without complication ([place] issues  Preterm labor symptoms and general obstetric precautions including but not limited to vaginal bleeding, contractions, leaking of fluid and fetal movement were reviewed in detail with the patient. Please refer to After Visit Summary for other counseling recommendations.  Return in about 2 weeks (around 10/26/2018) for hrob .   PiAletha HalimMD

## 2018-10-12 NOTE — Telephone Encounter (Signed)
Received alert from [employer] that patient had an elevated blood pressure 101/97. Per chart review, patient had OB visit earlier this morning in which BP was 101/67. BP was clearly entered in error. Will disregard.

## 2018-10-20 ENCOUNTER — Other Ambulatory Visit: Payer: Self-pay

## 2018-10-20 DIAGNOSIS — Z20822 Contact with and (suspected) exposure to covid-19: Secondary | ICD-10-CM

## 2018-10-25 LAB — NOVEL CORONAVIRUS, NAA: SARS-CoV-2, NAA: NOT DETECTED

## 2018-10-27 ENCOUNTER — Telehealth: Payer: Self-pay | Admitting: Obstetrics & Gynecology

## 2018-10-27 NOTE — Telephone Encounter (Signed)
Called the patient to inform of upcoming visit. Received a message the voicemail box is full and can receive any messages at this time.

## 2018-10-28 ENCOUNTER — Ambulatory Visit (INDEPENDENT_AMBULATORY_CARE_PROVIDER_SITE_OTHER): Payer: Medicaid Other | Admitting: Internal Medicine

## 2018-10-28 ENCOUNTER — Other Ambulatory Visit: Payer: Self-pay

## 2018-10-28 VITALS — BP 97/64 | HR 114 | Temp 98.1°F | Wt 114.9 lb

## 2018-10-28 DIAGNOSIS — O26892 Other specified pregnancy related conditions, second trimester: Secondary | ICD-10-CM

## 2018-10-28 DIAGNOSIS — O360920 Maternal care for other rhesus isoimmunization, second trimester, not applicable or unspecified: Secondary | ICD-10-CM

## 2018-10-28 DIAGNOSIS — O0990 Supervision of high risk pregnancy, unspecified, unspecified trimester: Secondary | ICD-10-CM

## 2018-10-28 DIAGNOSIS — O99012 Anemia complicating pregnancy, second trimester: Secondary | ICD-10-CM

## 2018-10-28 DIAGNOSIS — O9989 Other specified diseases and conditions complicating pregnancy, childbirth and the puerperium: Secondary | ICD-10-CM

## 2018-10-28 DIAGNOSIS — Z6791 Unspecified blood type, Rh negative: Secondary | ICD-10-CM

## 2018-10-28 DIAGNOSIS — O0992 Supervision of high risk pregnancy, unspecified, second trimester: Secondary | ICD-10-CM

## 2018-10-28 DIAGNOSIS — Z283 Underimmunization status: Secondary | ICD-10-CM

## 2018-10-28 DIAGNOSIS — O99019 Anemia complicating pregnancy, unspecified trimester: Secondary | ICD-10-CM

## 2018-10-28 DIAGNOSIS — O09899 Supervision of other high risk pregnancies, unspecified trimester: Secondary | ICD-10-CM

## 2018-10-28 DIAGNOSIS — N888 Other specified noninflammatory disorders of cervix uteri: Secondary | ICD-10-CM

## 2018-10-28 NOTE — Patient Instructions (Signed)

## 2018-10-28 NOTE — Progress Notes (Signed)
   PRENATAL VISIT NOTE  Subjective:  Whitney Lowe is a 33 y.o. G3P0020 at 61w6dbeing seen today for ongoing prenatal care.  She is currently monitored for the following issues for this high-risk pregnancy and has Crohn's ileocolitis ([place]; Long-term use of immunosuppressant medication-Stelara; Supervision of high risk pregnancy, antepartum; Rubella non-immune status, antepartum; Rh negative, antepartum; Anemia affecting pregnancy, antepartum; Cervical mass; Fibroid uterus; and Antepartum placental abruption on their problem list.  Patient reports no complaints.  Contractions: Not present. Vag. Bleeding: Scant.  Movement: Present. Denies leaking of fluid.   The following portions of the patient's history were reviewed and updated as appropriate: allergies, current medications, past family history, past medical history, past social history, past surgical history and problem list.   Objective:   Vitals:   10/28/18 1401  BP: 97/64  Pulse: (!) 114  Temp: 98.1 F (36.7 C)  Weight: 114 lb 14.4 oz (52.1 kg)    Fetal Status: Fetal Heart Rate (bpm): 147   Movement: Present     General:  Alert, oriented and cooperative. Patient is in no acute distress.  Skin: Skin is warm and dry. No rash noted.   Cardiovascular: Normal heart rate noted  Respiratory: Normal respiratory effort, no problems with respiration noted  Abdomen: Soft, gravid, appropriate for gestational age.  Pain/Pressure: Absent     Pelvic: Cervical exam deferred        Extremities: Normal range of motion.  Edema: None  Mental Status: Normal mood and affect. Normal behavior. Normal judgment and thought content.   Assessment and Plan:  Pregnancy: [id number] 227w6d1. Supervision of high risk pregnancy, antepartum Continue routine prenatal care.   2. Rh negative, antepartum Will need Rhogam at 28 weeks. Received at recent admission for placental abruption and vaginal bleeding.   3. Cervical mass At recent office visit to  follow up from hospitalization had pelvic exam that noted cervical polyp that was very friable. Patient reports that bleeding has slowed significantly and she has very minimal spotting now. Return precautions discussed.   4. Anemia affecting pregnancy, antepartum Last HgB after Feraheme infusions >10. Patient asymptomatic. Plan to check Ferritin level with 28 week labs.   5. Rubella non-immune status, antepartum Will need MMR PP.   There are no diagnoses linked to this encounter. Preterm labor symptoms and general obstetric precautions including but not limited to vaginal bleeding, contractions, leaking of fluid and fetal movement were reviewed in detail with the patient. Please refer to After Visit Summary for other counseling recommendations.   No follow-ups on file.  Future Appointments  Date Time Provider [place]7/17/2020  2:30 PM [facility]FC [place]

## 2018-11-05 ENCOUNTER — Other Ambulatory Visit: Payer: Self-pay

## 2018-11-05 ENCOUNTER — Ambulatory Visit (HOSPITAL_COMMUNITY)
Admission: RE | Admit: 2018-11-05 | Discharge: 2018-11-05 | Disposition: A | Discharge status: Home / Self Care | Payer: Medicaid Other | Source: Ambulatory Visit | Attending: Obstetrics and Gynecology | Admitting: Obstetrics and Gynecology

## 2018-11-05 DIAGNOSIS — O360120 Maternal care for anti-D [Rh] antibodies, second trimester, not applicable or unspecified: Secondary | ICD-10-CM

## 2018-11-05 DIAGNOSIS — Z3A24 24 weeks gestation of pregnancy: Secondary | ICD-10-CM

## 2018-11-05 DIAGNOSIS — Z362 Encounter for other antenatal screening follow-up: Secondary | ICD-10-CM

## 2018-11-05 DIAGNOSIS — K5090 Crohn's disease, unspecified, without complications: Secondary | ICD-10-CM

## 2018-11-05 DIAGNOSIS — O3412 Maternal care for benign tumor of corpus uteri, second trimester: Secondary | ICD-10-CM | POA: Diagnosis not present

## 2018-11-05 DIAGNOSIS — O99612 Diseases of the digestive system complicating pregnancy, second trimester: Secondary | ICD-10-CM | POA: Insufficient documentation

## 2018-11-05 DIAGNOSIS — O99012 Anemia complicating pregnancy, second trimester: Secondary | ICD-10-CM

## 2018-11-25 ENCOUNTER — Other Ambulatory Visit: Payer: Self-pay

## 2018-11-25 ENCOUNTER — Other Ambulatory Visit: Payer: Self-pay | Admitting: Emergency Medicine

## 2018-11-25 ENCOUNTER — Inpatient Hospital Stay (HOSPITAL_COMMUNITY)
Admission: AD | Admit: 2018-11-25 | Discharge: 2019-01-04 | DRG: 787 | Disposition: A | Discharge status: Home / Self Care | Payer: Medicaid Other | Source: Ambulatory Visit | Attending: Obstetrics and Gynecology | Admitting: Obstetrics and Gynecology

## 2018-11-25 ENCOUNTER — Encounter (HOSPITAL_COMMUNITY): Payer: Self-pay | Admitting: *Deleted

## 2018-11-25 DIAGNOSIS — O411230 Chorioamnionitis, third trimester, not applicable or unspecified: Secondary | ICD-10-CM | POA: Diagnosis not present

## 2018-11-25 DIAGNOSIS — O9989 Other specified diseases and conditions complicating pregnancy, childbirth and the puerperium: Secondary | ICD-10-CM | POA: Diagnosis not present

## 2018-11-25 DIAGNOSIS — O99019 Anemia complicating pregnancy, unspecified trimester: Secondary | ICD-10-CM

## 2018-11-25 DIAGNOSIS — O9962 Diseases of the digestive system complicating childbirth: Secondary | ICD-10-CM | POA: Diagnosis present

## 2018-11-25 DIAGNOSIS — O3442 Maternal care for other abnormalities of cervix, second trimester: Secondary | ICD-10-CM | POA: Diagnosis present

## 2018-11-25 DIAGNOSIS — O42919 Preterm premature rupture of membranes, unspecified as to length of time between rupture and onset of labor, unspecified trimester: Secondary | ICD-10-CM | POA: Diagnosis not present

## 2018-11-25 DIAGNOSIS — O4590 Premature separation of placenta, unspecified, unspecified trimester: Secondary | ICD-10-CM

## 2018-11-25 DIAGNOSIS — O42113 Preterm premature rupture of membranes, onset of labor more than 24 hours following rupture, third trimester: Secondary | ICD-10-CM | POA: Diagnosis not present

## 2018-11-25 DIAGNOSIS — O0990 Supervision of high risk pregnancy, unspecified, unspecified trimester: Secondary | ICD-10-CM

## 2018-11-25 DIAGNOSIS — N888 Other specified noninflammatory disorders of cervix uteri: Secondary | ICD-10-CM | POA: Diagnosis present

## 2018-11-25 DIAGNOSIS — O360120 Maternal care for anti-D [Rh] antibodies, second trimester, not applicable or unspecified: Secondary | ICD-10-CM | POA: Diagnosis not present

## 2018-11-25 DIAGNOSIS — O09899 Supervision of other high risk pregnancies, unspecified trimester: Secondary | ICD-10-CM

## 2018-11-25 DIAGNOSIS — Z87891 Personal history of nicotine dependence: Secondary | ICD-10-CM | POA: Diagnosis not present

## 2018-11-25 DIAGNOSIS — N841 Polyp of cervix uteri: Secondary | ICD-10-CM | POA: Diagnosis present

## 2018-11-25 DIAGNOSIS — O4593 Premature separation of placenta, unspecified, third trimester: Secondary | ICD-10-CM | POA: Diagnosis not present

## 2018-11-25 DIAGNOSIS — O3412 Maternal care for benign tumor of corpus uteri, second trimester: Secondary | ICD-10-CM | POA: Diagnosis present

## 2018-11-25 DIAGNOSIS — O42112 Preterm premature rupture of membranes, onset of labor more than 24 hours following rupture, second trimester: Secondary | ICD-10-CM | POA: Diagnosis not present

## 2018-11-25 DIAGNOSIS — Z3A31 31 weeks gestation of pregnancy: Secondary | ICD-10-CM | POA: Diagnosis not present

## 2018-11-25 DIAGNOSIS — K5080 Crohn's disease of both small and large intestine without complications: Secondary | ICD-10-CM | POA: Diagnosis present

## 2018-11-25 DIAGNOSIS — Z6791 Unspecified blood type, Rh negative: Secondary | ICD-10-CM | POA: Diagnosis not present

## 2018-11-25 DIAGNOSIS — O99613 Diseases of the digestive system complicating pregnancy, third trimester: Secondary | ICD-10-CM | POA: Diagnosis not present

## 2018-11-25 DIAGNOSIS — R000 Tachycardia, unspecified: Secondary | ICD-10-CM | POA: Diagnosis not present

## 2018-11-25 DIAGNOSIS — O3413 Maternal care for benign tumor of corpus uteri, third trimester: Secondary | ICD-10-CM | POA: Diagnosis not present

## 2018-11-25 DIAGNOSIS — Z283 Underimmunization status: Secondary | ICD-10-CM

## 2018-11-25 DIAGNOSIS — Z362 Encounter for other antenatal screening follow-up: Secondary | ICD-10-CM | POA: Diagnosis not present

## 2018-11-25 DIAGNOSIS — D259 Leiomyoma of uterus, unspecified: Secondary | ICD-10-CM | POA: Diagnosis present

## 2018-11-25 DIAGNOSIS — O99012 Anemia complicating pregnancy, second trimester: Secondary | ICD-10-CM | POA: Diagnosis not present

## 2018-11-25 DIAGNOSIS — Z3A27 27 weeks gestation of pregnancy: Secondary | ICD-10-CM

## 2018-11-25 DIAGNOSIS — O42912 Preterm premature rupture of membranes, unspecified as to length of time between rupture and onset of labor, second trimester: Secondary | ICD-10-CM | POA: Diagnosis present

## 2018-11-25 DIAGNOSIS — Z20828 Contact with and (suspected) exposure to other viral communicable diseases: Secondary | ICD-10-CM | POA: Diagnosis present

## 2018-11-25 DIAGNOSIS — Z3A28 28 weeks gestation of pregnancy: Secondary | ICD-10-CM | POA: Diagnosis not present

## 2018-11-25 DIAGNOSIS — O360130 Maternal care for anti-D [Rh] antibodies, third trimester, not applicable or unspecified: Secondary | ICD-10-CM | POA: Diagnosis not present

## 2018-11-25 DIAGNOSIS — O9902 Anemia complicating childbirth: Secondary | ICD-10-CM | POA: Diagnosis present

## 2018-11-25 DIAGNOSIS — O26893 Other specified pregnancy related conditions, third trimester: Secondary | ICD-10-CM | POA: Diagnosis present

## 2018-11-25 DIAGNOSIS — O289 Unspecified abnormal findings on antenatal screening of mother: Secondary | ICD-10-CM | POA: Diagnosis not present

## 2018-11-25 DIAGNOSIS — Z3A29 29 weeks gestation of pregnancy: Secondary | ICD-10-CM | POA: Diagnosis not present

## 2018-11-25 DIAGNOSIS — O4592 Premature separation of placenta, unspecified, second trimester: Principal | ICD-10-CM | POA: Diagnosis present

## 2018-11-25 DIAGNOSIS — D649 Anemia, unspecified: Secondary | ICD-10-CM | POA: Diagnosis present

## 2018-11-25 DIAGNOSIS — Z7960 Long term (current) use of unspecified immunomodulators and immunosuppressants: Secondary | ICD-10-CM

## 2018-11-25 DIAGNOSIS — Z3A30 30 weeks gestation of pregnancy: Secondary | ICD-10-CM | POA: Diagnosis not present

## 2018-11-25 DIAGNOSIS — O99612 Diseases of the digestive system complicating pregnancy, second trimester: Secondary | ICD-10-CM | POA: Diagnosis not present

## 2018-11-25 DIAGNOSIS — Z3A32 32 weeks gestation of pregnancy: Secondary | ICD-10-CM | POA: Diagnosis not present

## 2018-11-25 DIAGNOSIS — O99013 Anemia complicating pregnancy, third trimester: Secondary | ICD-10-CM | POA: Diagnosis not present

## 2018-11-25 DIAGNOSIS — O26899 Other specified pregnancy related conditions, unspecified trimester: Secondary | ICD-10-CM

## 2018-11-25 DIAGNOSIS — Z79899 Other long term (current) drug therapy: Secondary | ICD-10-CM

## 2018-11-25 DIAGNOSIS — O4292 Full-term premature rupture of membranes, unspecified as to length of time between rupture and onset of labor: Secondary | ICD-10-CM | POA: Diagnosis not present

## 2018-11-25 DIAGNOSIS — O42913 Preterm premature rupture of membranes, unspecified as to length of time between rupture and onset of labor, third trimester: Secondary | ICD-10-CM | POA: Diagnosis not present

## 2018-11-25 MED ORDER — MAGNESIUM SULFATE BOLUS VIA INFUSION
4.0000 g | Freq: Once | INTRAVENOUS | Status: AC
  Administered 2018-11-26: 01:00:00 4 g via INTRAVENOUS
  Filled 2018-11-25: qty 500

## 2018-11-25 MED ORDER — SODIUM CHLORIDE 0.9 % IV SOLN
2.0000 g | Freq: Four times a day (QID) | INTRAVENOUS | Status: AC
  Administered 2018-11-26 – 2018-11-27 (×8): 2 g via INTRAVENOUS
  Filled 2018-11-25 (×8): qty 2000

## 2018-11-25 MED ORDER — SODIUM CHLORIDE 0.9 % IV SOLN
250.0000 mL | INTRAVENOUS | Status: DC | PRN
  Administered 2018-11-27: 02:00:00 250 mL via INTRAVENOUS

## 2018-11-25 MED ORDER — CALCIUM CARBONATE ANTACID 500 MG PO CHEW: 2.0000 | CHEWABLE_TABLET | ORAL | Status: DC | PRN

## 2018-11-25 MED ORDER — SODIUM CHLORIDE 0.9% FLUSH
3.0000 mL | INTRAVENOUS | Status: DC | PRN
  Administered 2018-12-01: 21:00:00 3 mL via INTRAVENOUS
  Filled 2018-11-25: qty 3

## 2018-11-25 MED ORDER — SODIUM CHLORIDE 0.9% FLUSH
3.0000 mL | Freq: Two times a day (BID) | INTRAVENOUS | Status: DC
  Administered 2018-11-27 – 2018-12-03 (×5): 3 mL via INTRAVENOUS

## 2018-11-25 MED ORDER — AMOXICILLIN 500 MG PO CAPS
500.0000 mg | ORAL_CAPSULE | Freq: Three times a day (TID) | ORAL | Status: AC
  Administered 2018-11-28 – 2018-12-02 (×14): 500 mg via ORAL
  Filled 2018-11-25 (×15): qty 1

## 2018-11-25 MED ORDER — BETAMETHASONE SOD PHOS & ACET 6 (3-3) MG/ML IJ SUSP
12.0000 mg | INTRAMUSCULAR | Status: AC
  Administered 2018-11-26 (×2): 12 mg via INTRAMUSCULAR
  Filled 2018-11-25 (×2): qty 2

## 2018-11-25 MED ORDER — ACETAMINOPHEN 325 MG PO TABS
650.0000 mg | ORAL_TABLET | ORAL | Status: DC | PRN
  Administered 2018-12-02 – 2019-01-01 (×4): 650 mg via ORAL
  Filled 2018-11-25 (×5): qty 2

## 2018-11-25 MED ORDER — MAGNESIUM SULFATE 40 G IN LACTATED RINGERS - SIMPLE
2.0000 g/h | INTRAVENOUS | Status: AC
  Administered 2018-11-26 (×2): 2 g/h via INTRAVENOUS
  Filled 2018-11-25 (×2): qty 500

## 2018-11-25 MED ORDER — PRENATAL MULTIVITAMIN CH
1.0000 | ORAL_TABLET | Freq: Every day | ORAL | Status: DC
  Administered 2018-11-27 – 2018-12-09 (×9): 1 via ORAL
  Filled 2018-11-25 (×13): qty 1

## 2018-11-25 MED ORDER — ZOLPIDEM TARTRATE 5 MG PO TABS
5.0000 mg | ORAL_TABLET | Freq: Every evening | ORAL | Status: DC | PRN
  Administered 2019-01-01: 5 mg via ORAL
  Filled 2018-11-25: qty 1

## 2018-11-25 MED ORDER — DOCUSATE SODIUM 100 MG PO CAPS: 100.0000 mg | ORAL_CAPSULE | Freq: Every day | ORAL | Status: DC

## 2018-11-25 MED ORDER — AZITHROMYCIN 250 MG PO TABS: 500.0000 mg | ORAL_TABLET | Freq: Every day | ORAL | Status: DC

## 2018-11-25 NOTE — MAU Note (Signed)
About 30 mins ago started leaking clear fld. Continues to leak on admission to MAU to point her pants are wet. No pain. Hx fibroids. Saw some bleeding x 1 this am when wiped

## 2018-11-25 NOTE — H&P (Signed)
Whitney Lowe is a 33 y.o. female presenting for Leaking of fluid from vagina since 2230, clear fluid. Does not feel any contractions.  Has a history of Crohns, uterine fibroids, and cervical polyp.   Patient Active Problem List   Diagnosis Date Noted  . Preterm premature rupture of membranes (PPROM) with unknown onset of labor 11/25/2018  . Antepartum placental abruption 10/05/2018  . Cervical mass 10/04/2018  . Fibroid uterus 10/04/2018  . Anemia affecting pregnancy, antepartum 09/06/2018  . Rubella non-immune status, antepartum 09/03/2018  . Rh negative, antepartum 09/03/2018  . Supervision of high risk pregnancy, antepartum 08/17/2018  . Long-term use of immunosuppressant medication-Stelara 07/21/2018  . Crohn's ileocolitis ([place]) 06/19/2016    . OB History    Gravida  3   Para      Term      Preterm      AB  2   Living        SAB  1   TAB  1   Ectopic      Multiple      Live Births             Past Medical History:  Diagnosis Date  . ADHD    stopped abilify with preg  . ADHD (attention deficit hyperactivity disorder)   . Anemia   . Anxiety    not currently on meds  . Cataract    Rt eye  . Complication of anesthesia    became combative   . Crohn's ileocolitis ([place]) 06/19/2016  . Eczema   . Fibroids   . Infection    UTI  . Ovarian cyst    Past Surgical History:  Procedure Laterality Date  . COLONOSCOPY    . ESOPHAGOGASTRODUODENOSCOPY    . removal of cyst in ear     6th grade   Family History: family history includes ADD / ADHD in her father; Anxiety disorder in her father and mother; Asthma in her father; Blindness in her mother; COPD in her father; Crohn's disease in her mother; Depression in her father; Diabetes in her father and sister; Diabetes Mellitus II in her father; Heart disease in her mother; Hypertension in her father. Social History:  reports that she quit smoking about 7 months ago. Her smoking use included cigars and cigarettes.  She has never used smokeless tobacco. She reports previous alcohol use. She reports previous drug use. Drug: Marijuana.     Maternal Diabetes: No Genetic Screening: Normal Maternal Ultrasounds/Referrals: Normal  Does have uterine fibroids Fetal Ultrasounds or other Referrals:  None Maternal Substance Abuse:  MJ last in Feb Significant Maternal Medications:  Meds include: Other: Stelara Significant Maternal Lab Results:  None Other Comments:  PPROM, uterine irritability, Crohns, cervical polyp, ?abruption in earlier pregnancy, negative eval for abruption  Review of Systems  Constitutional: Negative for chills and fever.  Eyes: Negative for blurred vision.  Respiratory: Negative for shortness of breath.   Cardiovascular: Negative for leg swelling.  Gastrointestinal: Negative for abdominal pain, constipation, diarrhea, nausea and vomiting.  Genitourinary:       Leaking large amt of clear fluid   Neurological: Negative for dizziness and headaches.   Maternal Medical History:  Reason for admission: Rupture of membranes.  Nausea.  Contractions: Onset was 1-2 hours ago.   Frequency: irregular.   Perceived severity is mild.    Fetal activity: Perceived fetal activity is normal.   Last perceived fetal movement was within the past hour.    Prenatal complications: No  bleeding, PIH, placental abnormality or pre-eclampsia.   Prenatal Complications - Diabetes: none.      Blood pressure 114/73, pulse (!) 105, temperature 98.2 F (36.8 C), resp. rate 18, height 4' 10"  (1.473 m), weight 56.2 kg, last menstrual period 05/21/2018. Maternal Exam:  Uterine Assessment: Contraction strength is mild.  Contraction frequency is irregular.  Uterine irritability   Abdomen: Patient reports no abdominal tenderness. Fundal height is 27.    Introitus: Normal vulva. Normal vagina.  Ferning test: positive.  Nitrazine test: not done. Amniotic fluid character: clear.  Cervix: Cervix evaluated by  sterile speculum exam.   Appears closed and long Copious clear fluid leaking Small cervical polyp  Fetal Exam Fetal Monitor Review: Mode: ultrasound.   Baseline rate: 145.  Variability: moderate (6-25 bpm).   Pattern: accelerations present and no decelerations.    Fetal State Assessment: Category I - tracings are normal.     Physical Exam  Constitutional: She is oriented to person, place, and time. She appears well-developed and well-nourished. No distress.  Cardiovascular: Normal rate and regular rhythm.  Respiratory: Effort normal. No respiratory distress.  GI: Soft. She exhibits no distension. There is no abdominal tenderness. There is no rebound and no guarding.  Genitourinary:    Vulva normal.     Genitourinary Comments: Cervix appears closed Copious clear fluid   Musculoskeletal: Normal range of motion.  Neurological: She is alert and oriented to person, place, and time.  Skin: Skin is warm and dry.  Psychiatric: She has a normal mood and affect.    Prenatal labs: ABO, Rh: --/--/B NEG (06/15 1455) Antibody: NEG (06/15 1455) Rubella: <0.90 (05/14 1651) RPR: Non Reactive (05/14 1651)  HBsAg: Negative (05/14 1651)  HIV: Non Reactive (05/14 1651)  GBS:    Collected with cultures  Assessment/Plan: Single intrauterine pregnancy at 55w0dPreterm Premature Rupture of membranes Uterine irritability  Admit to Antenatal Dr AHarolyn Rutherfordupdated PPROM antibiotics Magnesium sulfate for neuroprophylaxis Betamethasone series NICU consult called MD to follow   MHansel Feinstein8/09/2018, 11:45 PM

## 2018-11-26 ENCOUNTER — Inpatient Hospital Stay (HOSPITAL_COMMUNITY): Payer: Medicaid Other

## 2018-11-26 ENCOUNTER — Encounter (HOSPITAL_COMMUNITY): Payer: Self-pay

## 2018-11-26 DIAGNOSIS — O4292 Full-term premature rupture of membranes, unspecified as to length of time between rupture and onset of labor: Secondary | ICD-10-CM

## 2018-11-26 DIAGNOSIS — O99612 Diseases of the digestive system complicating pregnancy, second trimester: Secondary | ICD-10-CM

## 2018-11-26 DIAGNOSIS — O99012 Anemia complicating pregnancy, second trimester: Secondary | ICD-10-CM

## 2018-11-26 DIAGNOSIS — Z3A27 27 weeks gestation of pregnancy: Secondary | ICD-10-CM

## 2018-11-26 DIAGNOSIS — Z362 Encounter for other antenatal screening follow-up: Secondary | ICD-10-CM

## 2018-11-26 DIAGNOSIS — O3412 Maternal care for benign tumor of corpus uteri, second trimester: Secondary | ICD-10-CM

## 2018-11-26 DIAGNOSIS — O360120 Maternal care for anti-D [Rh] antibodies, second trimester, not applicable or unspecified: Secondary | ICD-10-CM

## 2018-11-26 LAB — RPR: RPR Ser Ql: NONREACTIVE

## 2018-11-26 LAB — FERRITIN: Ferritin: 61 ng/mL (ref 11–307)

## 2018-11-26 LAB — CBC
HCT: 35 % — ABNORMAL LOW (ref 36.0–46.0)
Hemoglobin: 11.5 g/dL — ABNORMAL LOW (ref 12.0–15.0)
MCH: 30.4 pg (ref 26.0–34.0)
MCHC: 32.9 g/dL (ref 30.0–36.0)
MCV: 92.6 fL (ref 80.0–100.0)
Platelets: 336 10*3/uL (ref 150–400)
RBC: 3.78 MIL/uL — ABNORMAL LOW (ref 3.87–5.11)
RDW: 14 % (ref 11.5–15.5)
WBC: 8.4 10*3/uL (ref 4.0–10.5)
nRBC: 0 % (ref 0.0–0.2)

## 2018-11-26 LAB — SARS CORONAVIRUS 2 BY RT PCR (HOSPITAL ORDER, PERFORMED IN ~~LOC~~ HOSPITAL LAB): SARS Coronavirus 2: NEGATIVE

## 2018-11-26 MED ORDER — AZITHROMYCIN 250 MG PO TABS
1000.0000 mg | ORAL_TABLET | Freq: Once | ORAL | Status: AC
  Administered 2018-11-26: 1000 mg via ORAL
  Filled 2018-11-26: qty 4

## 2018-11-26 MED ORDER — LACTATED RINGERS IV SOLN
INTRAVENOUS | Status: AC
  Administered 2018-11-26 (×3): via INTRAVENOUS

## 2018-11-26 MED ORDER — DOCUSATE SODIUM 100 MG PO CAPS
100.0000 mg | ORAL_CAPSULE | Freq: Two times a day (BID) | ORAL | Status: DC | PRN
  Administered 2018-11-26 – 2018-11-30 (×4): 100 mg via ORAL
  Filled 2018-11-26 (×3): qty 1

## 2018-11-26 MED ORDER — ONDANSETRON HCL 4 MG/2ML IJ SOLN
4.0000 mg | Freq: Four times a day (QID) | INTRAMUSCULAR | Status: DC | PRN
  Administered 2018-11-26: 05:00:00 4 mg via INTRAVENOUS
  Filled 2018-11-26: qty 2

## 2018-11-26 NOTE — MAU Note (Signed)
Covid swab obtained without difficulty and pt tol well. No symptoms 

## 2018-11-26 NOTE — Consult Note (Signed)
Neonatology Consultation: Reason: PPROM 27 weeks Requested: Anyanwu  I met with parents re: possible NICU admission of premature baby.  I told them that respiratory support was likely, as was prolonged NICU stay, antibiotics, and IV nutrition. I reassured them that survival was high.  We are happy to return to provide additional details if the parents wish.  She was just being admitted and stabilized so the consultation was abbreviated.  R. L. Nicolis Boody M.D.

## 2018-11-26 NOTE — Progress Notes (Addendum)
Daily Antepartum Note  Admission Date: 11/25/2018 Current Date: 11/26/2018 10:14 AM  Whitney Lowe is a 33 y.o. G3P0020 @ [redacted]w[redacted]d HD#2, admitted for PPROM @ 26/6.  Pregnancy complicated by: Patient Active Problem List   Diagnosis Date Noted  . Preterm premature rupture of membranes (PPROM), second trimester, antepartum 11/25/2018  . Antepartum placental abruption 10/05/2018  . Cervical mass 10/04/2018  . Fibroid uterus 10/04/2018  . Anemia affecting pregnancy, antepartum 09/06/2018  . Rubella non-immune status, antepartum 09/03/2018  . Rh negative, antepartum 09/03/2018  . Supervision of high risk pregnancy, antepartum 08/17/2018  . Long-term use of immunosuppressant medication-Stelara 07/21/2018  . Crohn's ileocolitis ([place] 06/19/2016    Overnight/24hr events:  See H&P  Subjective:  +LOF clear; no VB, contractions, decreased FM  Objective:    Current Vital Signs 24h Vital Sign Ranges  T 98.1 F (36.7 C) Temp  Avg: 98.1 F (36.7 C)  Min: 98 F (36.7 C)  Max: 98.2 F (36.8 C)  BP 95/61 BP  Min: 85/49  Max: 114/73  HR (!) 101 Pulse  Avg: 99.2  Min: 92  Max: 105  RR 16 Resp  Avg: 16.9  Min: 16  Max: 18  SaO2 98 % (room air) SpO2  Avg: 98.4 %  Min: 97 %  Max: 100 %       24 Hour I/O Current Shift I/O  Time Ins Outs 08/06 0701 - 08/07 0700 In: 838.8 [P.O.:100; I.V.:638.8] Out: 925 [Urine:925] 08/07 0701 - 08/07 1900 In: 765.2 [P.O.:290; I.V.:366.4] Out: 900 [Urine:900]  UOP: >101mhr  FHT: 135 baseline, +accels, no decel, mod varibility Toco: quiet   Physical exam: General: Well nourished, well developed female in no acute distress. Abdomen: gravid nttp Respiratory: no resp distress Extremities: no clubbing, cyanosis or edema Skin: Warm and dry.   Medications: Current Facility-Administered Medications  Medication Dose Route Frequency Provider Last Rate Last Dose  . 0.9 %  sodium chloride infusion  250 mL Intravenous PRN WiSeabron SpatesCNM      .  acetaminophen (TYLENOL) tablet 650 mg  650 mg Oral Q4H PRN WiSeabron SpatesCNM      . ampicillin (OMNIPEN) 2 g in sodium chloride 0.9 % 100 mL IVPB  2 g Intravenous Q6H WiSeabron SpatesCNM 300 mL/hr at 11/26/18 0750 2 g at 11/26/18 0750   Followed by  . [START ON 11/28/2018] amoxicillin (AMOXIL) capsule 500 mg  500 mg Oral Q8H WiSeabron SpatesCNM      . betamethasone acetate-betamethasone sodium phosphate (CELESTONE) injection 12 mg  12 mg Intramuscular Q24H WiSeabron SpatesCNM   12 mg at 11/26/18 0018  . calcium carbonate (TUMS - dosed in mg elemental calcium) chewable tablet 400 mg of elemental calcium  2 tablet Oral Q4H PRN WiSeabron SpatesCNM      . docusate sodium (COLACE) capsule 100 mg  100 mg Oral BID PRN PiAletha HalimMD   100 mg at 11/26/18 0945  . lactated ringers infusion   Intravenous Continuous PiAletha HalimMD 75 mL/hr at 11/26/18 0945    . magnesium sulfate 40 grams in LR 500 mL OB infusion  2 g/hr Intravenous Titrated PiAletha HalimMD 25 mL/hr at 11/26/18 0630 2 g/hr at 11/26/18 0630  . ondansetron (ZOFRAN) injection 4 mg  4 mg Intravenous Q6H PRN Anyanwu, UgSallyanne HaversMD   4 mg at 11/26/18 0525  . prenatal multivitamin tablet 1 tablet  1 tablet Oral Q1200 WiSeabron SpatesCNM      .  sodium chloride flush (NS) 0.9 % injection 3 mL  3 mL Intravenous Q12H Seabron Spates, CNM      . sodium chloride flush (NS) 0.9 % injection 3 mL  3 mL Intravenous PRN Seabron Spates, CNM      . zolpidem (AMBIEN) tablet 5 mg  5 mg Oral QHS PRN Seabron Spates, CNM        Labs:  Recent Labs  Lab 11/25/18 2355  WBC 8.4  HGB 11.5*  HCT 35.0*  PLT 336   B NEG  Pending: GBS, GC/CT, RPR  Radiology: no new imaging  Assessment & Plan:  Pt stable *Pregnancy: category I with accels, fetal status reassuring. GTT with at least 7-10d s/p last bmz injection.  *PPROM: D#1 latency abx.  *Preterm: BMZ #2 at [facility] tonight. Rpt growth u/s for later today. D/c Mg with 2nd bmz  shot -s/p NICU consult *Rh neg: rhogam at 28wks *PPx: SCDs, bedrest while on Mg *FEN/GI: MIVF on Mg. Regular diet. Last stelara on 8/4 (q8wk injection). No current issues *Dispo: inpatient until delivery with latest at [facility])

## 2018-11-27 LAB — GC/CHLAMYDIA PROBE AMP (~~LOC~~) NOT AT ARMC
Chlamydia: NEGATIVE
Neisseria Gonorrhea: NEGATIVE

## 2018-11-27 NOTE — Progress Notes (Signed)
Patient ID: Analiz Tvedt, female   DOB: 11/20/1985, 33 y.o.   MRN: [id number]  Mayce Noyes is a 33 y.o. G3P0020 @ [redacted]w[redacted]d HD#3, admitted for PPROM @ 26/6.  Pregnancy complicated by:     Patient Active Problem List   Diagnosis Date Noted  . Preterm premature rupture of membranes (PPROM), second trimester, antepartum 11/25/2018  . Antepartum placental abruption 10/05/2018  . Cervical mass 10/04/2018  . Fibroid uterus 10/04/2018  . Anemia affecting pregnancy, antepartum 09/06/2018  . Rubella non-immune status, antepartum 09/03/2018  . Rh negative, antepartum 09/03/2018  . Supervision of high risk pregnancy, antepartum 08/17/2018  . Long-term use of immunosuppressant medication-Stelara 07/21/2018  . Crohn's ileocolitis ([place] 06/19/2016    Overnight/24hr events:  Reassuring FH monitoring  Subjective:  +LOF clear; no VB, contractions,  FM  Objective:    FHT: 135 baseline, +accels, no decel, mod varibility Toco: quiet   Physical exam: General: Well nourished, well developed female in no acute distress. Abdomen: gravid nttp Respiratory: no resp distress Extremities: no clubbing, cyanosis or edema Skin: Warm and dry.  No evidence of DVT  Medications:          Current Facility-Administered Medications  Medication Dose Route Frequency Provider Last Rate Last Dose  . 0.9 %  sodium chloride infusion  250 mL Intravenous PRN WSeabron Spates CNM      . acetaminophen (TYLENOL) tablet 650 mg  650 mg Oral Q4H PRN WSeabron Spates CNM      . ampicillin (OMNIPEN) 2 g in sodium chloride 0.9 % 100 mL IVPB  2 g Intravenous Q6H WSeabron Spates CNM 300 mL/hr at 11/26/18 0750 2 g at 11/26/18 0750   Followed by  . [START ON 11/28/2018] amoxicillin (AMOXIL) capsule 500 mg  500 mg Oral Q8H WSeabron Spates CNM      .         .Marland Kitchencalcium carbonate (TUMS - dosed in mg elemental calcium) chewable tablet 400 mg of elemental calcium  2 tablet Oral Q4H PRN WSeabron Spates CNM      .  docusate sodium (COLACE) capsule 100 mg  100 mg Oral BID PRN PAletha Halim MD   100 mg at 11/26/18 0945  . lactated ringers infusion   Intravenous Continuous PAletha Halim MD 75 mL/hr at 11/26/18 0945             . ondansetron (ZOFRAN) injection 4 mg  4 mg Intravenous Q6H PRN Anyanwu, USallyanne Havers MD   4 mg at 11/26/18 0525  . prenatal multivitamin tablet 1 tablet  1 tablet Oral Q1200 WSeabron Spates CNM      . sodium chloride flush (NS) 0.9 % injection 3 mL  3 mL Intravenous Q12H WSeabron Spates CNM      . sodium chloride flush (NS) 0.9 % injection 3 mL  3 mL Intravenous PRN WSeabron Spates CNM      . zolpidem (AMBIEN) tablet 5 mg  5 mg Oral QHS PRN WSeabron Spates CNM        Labs:  Last Labs      Recent Labs  Lab 11/25/18 2355  WBC 8.4  HGB 11.5*  HCT 35.0*  PLT 336     B NEG     Assessment & Plan:  Pt stable *Pregnancy: category I with accels, fetal status reassuring. GTT with at least 7-10d s/p last bmz injection.  *PPROM: D#2 latency abx.  *Preterm: s/p BMZ x 2  -s/p NICU consult *  Rh neg: rhogam at 28wks *PPx: SCDs, bedrest while on Mg  Regular diet. Last stelara on 8/4 (q8wk injection). No current issues *Dispo: inpatient until delivery with latest at 34/0

## 2018-11-28 LAB — CULTURE, BETA STREP (GROUP B ONLY)

## 2018-11-28 MED ORDER — POLYETHYLENE GLYCOL 3350 17 G PO PACK
17.0000 g | PACK | Freq: Once | ORAL | Status: AC
  Administered 2018-11-28: 17 g via ORAL
  Filled 2018-11-28: qty 1

## 2018-11-28 NOTE — Progress Notes (Signed)
Patient ID: Whitney Lowe, female   DOB: 1985/09/27, 33 y.o.   MRN: [id number] [place]) NOTE  Whitney Lowe is a 33 y.o. G3P0020 at 52w2dby best clinical estimate who is admitted for rupture of membranes.   Fetal presentation is cephalic. Length of Stay:  3  Days  Subjective: Still leaking clear fluid Patient reports the fetal movement as active. Patient reports uterine contraction  activity as none. Patient reports  vaginal bleeding as scant staining. Patient describes fluid per vagina as None.  Vitals:  Blood pressure (!) 84/47, pulse 79, temperature 98.5 F (36.9 C), temperature source Oral, resp. rate 17, height 4' 10"  (1.473 m), weight 56.2 kg, last menstrual period 05/21/2018, SpO2 98 %. Physical Examination:  General appearance - alert, well appearing, and in no distress Chest - normal effort Abdomen - gravid, non-tender Fundal Height:  size equals dates Extremities: Homans sign is negative, no sign of DVT  Membranes:ruptured, clear fluid  Fetal Monitoring:  Baseline: 145 bpm, Variability: Good {> 6 bpm), Accelerations: Reactive and Decelerations: Absent  Medications:  Scheduled . amoxicillin  500 mg Oral Q8H  . prenatal multivitamin  1 tablet Oral Q1200  . sodium chloride flush  3 mL Intravenous Q12H   I have reviewed the patient's current medications.  ASSESSMENT: Principal Problem:   Preterm premature rupture of membranes (PPROM), second trimester, antepartum Active Problems:   Crohn's ileocolitis ([facility])   Long-term use of immunosuppressant medication-Stelara   Supervision of high risk pregnancy, antepartum   Rubella non-immune status, antepartum   Rh negative, antepartum   Anemia affecting pregnancy, antepartum   Cervical mass   Fibroid uterus   PLAN: Inpatient until delivery Delivery with s/sx's of infection or worsening fetal/maternal status S/p BMZ Mag for CP ppx prior to delivery Check timing of next stelara  dose   TDonnamae Jude MD 11/28/2018,8:07 AM

## 2018-11-29 LAB — BPAM RBC
Blood Product Expiration Date: 202008192359
Blood Product Expiration Date: 202008192359
Unit Type and Rh: 9500
Unit Type and Rh: 9500

## 2018-11-29 LAB — CBC WITH DIFFERENTIAL/PLATELET
Abs Immature Granulocytes: 0.26 10*3/uL — ABNORMAL HIGH (ref 0.00–0.07)
Basophils Absolute: 0 10*3/uL (ref 0.0–0.1)
Basophils Relative: 0 %
Eosinophils Absolute: 0.1 10*3/uL (ref 0.0–0.5)
Eosinophils Relative: 1 %
HCT: 34.4 % — ABNORMAL LOW (ref 36.0–46.0)
Hemoglobin: 11.3 g/dL — ABNORMAL LOW (ref 12.0–15.0)
Immature Granulocytes: 2 %
Lymphocytes Relative: 26 %
Lymphs Abs: 2.8 10*3/uL (ref 0.7–4.0)
MCH: 30.5 pg (ref 26.0–34.0)
MCHC: 32.8 g/dL (ref 30.0–36.0)
MCV: 93 fL (ref 80.0–100.0)
Monocytes Absolute: 1 10*3/uL (ref 0.1–1.0)
Monocytes Relative: 10 %
Neutro Abs: 6.6 10*3/uL (ref 1.7–7.7)
Neutrophils Relative %: 61 %
Platelets: 349 10*3/uL (ref 150–400)
RBC: 3.7 MIL/uL — ABNORMAL LOW (ref 3.87–5.11)
RDW: 14 % (ref 11.5–15.5)
WBC: 10.7 10*3/uL — ABNORMAL HIGH (ref 4.0–10.5)
nRBC: 0.2 % (ref 0.0–0.2)

## 2018-11-29 LAB — TYPE AND SCREEN
ABO/RH(D): B NEG
ABO/RH(D): B NEG
Antibody Screen: POSITIVE
Antibody Screen: NEGATIVE
Unit division: 0
Unit division: 0

## 2018-11-29 LAB — COMPREHENSIVE METABOLIC PANEL
ALT: 23 U/L (ref 0–44)
AST: 17 U/L (ref 15–41)
Albumin: 2.6 g/dL — ABNORMAL LOW (ref 3.5–5.0)
Alkaline Phosphatase: 70 U/L (ref 38–126)
Anion gap: 10 (ref 5–15)
BUN: 9 mg/dL (ref 6–20)
CO2: 21 mmol/L — ABNORMAL LOW (ref 22–32)
Calcium: 8.5 mg/dL — ABNORMAL LOW (ref 8.9–10.3)
Chloride: 104 mmol/L (ref 98–111)
Creatinine, Ser: 0.82 mg/dL (ref 0.44–1.00)
GFR calc Af Amer: >60 mL/min (ref >60)
GFR calc non Af Amer: >60 mL/min (ref >60)
Glucose, Bld: 110 mg/dL — ABNORMAL HIGH (ref 70–99)
Potassium: 3.4 mmol/L — ABNORMAL LOW (ref 3.5–5.1)
Sodium: 135 mmol/L (ref 135–145)
Total Bilirubin: 0.2 mg/dL — ABNORMAL LOW (ref 0.3–1.2)
Total Protein: 6.7 g/dL (ref 6.5–8.1)

## 2018-11-29 MED ORDER — RHO D IMMUNE GLOBULIN 1500 UNIT/2ML IJ SOSY: 300.0000 ug | PREFILLED_SYRINGE | Freq: Once | INTRAMUSCULAR | Status: DC

## 2018-11-29 NOTE — Progress Notes (Addendum)
Patient ID: Mozel Burdett, female   DOB: 07-Jan-1986, 33 y.o.   MRN: [id number] [facility]) NOTE  Virgilia Quigg is a 33 y.o. G3P0020 at 2w3dby best clinical estimate who is admitted for rupture of membranes.   Fetal presentation is cephalic. Length of Stay:  4  Days  Subjective: Still leaking clear fluid but no other complaints Patient reports the fetal movement as active. Patient reports uterine contraction  activity as none. Patient reports  vaginal bleeding as scant staining. Patient describes fluid per vagina as None.  Vitals:  Blood pressure 97/66, pulse 91, temperature (P) 98.2 F (36.8 C), temperature source (P) Oral, resp. rate (P) 16, height 4' 10"  (1.473 m), weight 56.2 kg, last menstrual period 05/21/2018, SpO2 100 %. Physical Examination:  General appearance - alert, well appearing, and in no distress Chest - normal effort Abdomen - gravid, non-tender Fundal Height:  size equals dates Extremities: Homans sign is negative, no sign of DVT  Membranes:ruptured, clear fluid  Fetal Monitoring:  Baseline: 145 bpm, Variability: Good {> 6 bpm), Accelerations: Reactive and Decelerations: Absent  Medications:  Scheduled . amoxicillin  500 mg Oral Q8H  . prenatal multivitamin  1 tablet Oral Q1200  . sodium chloride flush  3 mL Intravenous Q12H   I have reviewed the patient's current medications.  ASSESSMENT: Principal Problem:   Preterm premature rupture of membranes (PPROM), second trimester, antepartum Active Problems:   [place]'s [place]ocolitis ([place]   Long-term use of immunosuppressant medication-Stelara   Supervision of high risk pregnancy, antepartum   Rubella non-immune status, antepartum   Rh negative, antepartum   Anemia affecting pregnancy, antepartum   Cervical mass   Fibroid uterus   PLAN: Inpatient until delivery Delivery with s/sx's of infection or worsening fetal/maternal status S/p BMZ Mag for CP ppx prior to delivery Stable  Crohns, last Stelara dose was on 11/23/18. Routine antenatal care  UVerita Schneiders MD 11/29/2018,3:26 PM

## 2018-11-29 NOTE — Plan of Care (Signed)
  Problem: Clinical Measurements: Goal: Ability to maintain clinical measurements within normal limits will improve Outcome: Progressing  Pt. Will report any changes. Pt. Reports good fetal mvmt throughout the day. She has a slight amount of drainage which has a light pink tinge to it. Will continue to monitor.

## 2018-11-30 MED ORDER — BISACODYL 10 MG RE SUPP
10.0000 mg | Freq: Every day | RECTAL | Status: DC | PRN
  Administered 2018-11-30: 11:00:00 10 mg via RECTAL
  Filled 2018-11-30 (×2): qty 1

## 2018-11-30 MED ORDER — MAGNESIUM HYDROXIDE 400 MG/5ML PO SUSP
30.0000 mL | Freq: Every day | ORAL | Status: DC | PRN
  Administered 2018-11-30: 11:00:00 30 mL via ORAL
  Filled 2018-11-30: qty 30

## 2018-11-30 MED ORDER — SENNOSIDES-DOCUSATE SODIUM 8.6-50 MG PO TABS
2.0000 | ORAL_TABLET | Freq: Two times a day (BID) | ORAL | Status: DC
  Administered 2018-11-30 – 2018-12-31 (×62): 2 via ORAL
  Filled 2018-11-30 (×65): qty 2

## 2018-11-30 MED ORDER — HYDROCORTISONE ACETATE 25 MG RE SUPP
25.0000 mg | Freq: Two times a day (BID) | RECTAL | Status: DC | PRN
  Administered 2018-12-02: 25 mg via RECTAL
  Filled 2018-11-30 (×2): qty 1

## 2018-11-30 NOTE — Progress Notes (Signed)
Patient call out to report pink tinged fluid with a small clot-smaller than a dime. Pt states that she is in no pain and the fetus is active. FHR was reassuring.Will continue to monitor. Toya Smothers, RN

## 2018-11-30 NOTE — Progress Notes (Signed)
Patient ID: Whitney Lowe, female   DOB: Jun 30, 1985, 33 y.o.   MRN: [id number] [place]) NOTE  Whitney Lowe is a 33 y.o. G3P0020 at 41w4dby best clinical estimate who is admitted for rupture of membranes.   Fetal presentation is cephalic. Length of Stay:  5  Days  Subjective: Still leaking clear fluid but no other complaints Patient reports the fetal movement as active. Patient reports uterine contraction  activity as none. Patient reports  vaginal bleeding as scant staining. Patient describes fluid per vagina as none.  Vitals:  Blood pressure (!) 89/66, pulse (!) 110, temperature 98.9 F (37.2 C), temperature source Oral, resp. rate 18, height 4' 10"  (1.473 m), weight 56.2 kg, last menstrual period 05/21/2018, SpO2 100 %. Physical Examination:  eneral appearance - alert, well appearing, and in no distress Chest - normal effort Abdomen - gravid, non-tender Fundal Height:  size equals dates Extremities: Homans sign is negative, no sign of DVT  Membranes:ruptured, clear fluid  Fetal Monitoring:  Baseline: 155 bpm, Variability: Good {> 6 bpm), Accelerations: Reactive and Decelerations: Absent  Medications:  Scheduled . amoxicillin  500 mg Oral Q8H  . prenatal multivitamin  1 tablet Oral Q1200  . [START ON 12/02/2018] rho (d) immune globulin  300 mcg Intramuscular Once  . senna-docusate  2 tablet Oral BID  . sodium chloride flush  3 mL Intravenous Q12H   I have reviewed the patient's current medications.  ASSESSMENT: Principal Problem:   Preterm premature rupture of membranes (PPROM), second trimester, antepartum Active Problems:   [place]'s [place]ocolitis ([place]   Long-term use of immunosuppressant medication-Stelara   Supervision of high risk pregnancy, antepartum   Rubella non-immune status, antepartum   Rh negative, antepartum   Anemia affecting pregnancy, antepartum   Cervical mass   Fibroid uterus   PLAN: Inpatient until delivery Delivery  with s/sx's of infection or worsening fetal/maternal status S/p BMZ Mag for CP ppx prior to delivery Stable Crohns, last Stelara dose was on 11/23/18. Routine antenatal care  UVerita Schneiders MD 11/30/2018,12:46 PM

## 2018-12-01 DIAGNOSIS — R000 Tachycardia, unspecified: Secondary | ICD-10-CM | POA: Diagnosis not present

## 2018-12-01 LAB — COMPREHENSIVE METABOLIC PANEL
ALT: 23 U/L (ref 0–44)
AST: 15 U/L (ref 15–41)
Albumin: 2.7 g/dL — ABNORMAL LOW (ref 3.5–5.0)
Alkaline Phosphatase: 81 U/L (ref 38–126)
Anion gap: 9 (ref 5–15)
BUN: 12 mg/dL (ref 6–20)
CO2: 22 mmol/L (ref 22–32)
Calcium: 9.2 mg/dL (ref 8.9–10.3)
Chloride: 103 mmol/L (ref 98–111)
Creatinine, Ser: 0.71 mg/dL (ref 0.44–1.00)
GFR calc Af Amer: >60 mL/min (ref >60)
GFR calc non Af Amer: >60 mL/min (ref >60)
Glucose, Bld: 84 mg/dL (ref 70–99)
Potassium: 4.2 mmol/L (ref 3.5–5.1)
Sodium: 134 mmol/L — ABNORMAL LOW (ref 135–145)
Total Bilirubin: 0.4 mg/dL (ref 0.3–1.2)
Total Protein: 7.2 g/dL (ref 6.5–8.1)

## 2018-12-01 LAB — CBC WITH DIFFERENTIAL/PLATELET
Abs Immature Granulocytes: 0.36 10*3/uL — ABNORMAL HIGH (ref 0.00–0.07)
Basophils Absolute: 0 10*3/uL (ref 0.0–0.1)
Basophils Relative: 0 %
Eosinophils Absolute: 0.1 10*3/uL (ref 0.0–0.5)
Eosinophils Relative: 1 %
HCT: 37.5 % (ref 36.0–46.0)
Hemoglobin: 12.7 g/dL (ref 12.0–15.0)
Immature Granulocytes: 3 %
Lymphocytes Relative: 25 %
Lymphs Abs: 2.7 10*3/uL (ref 0.7–4.0)
MCH: 30.5 pg (ref 26.0–34.0)
MCHC: 33.9 g/dL (ref 30.0–36.0)
MCV: 90.1 fL (ref 80.0–100.0)
Monocytes Absolute: 0.9 10*3/uL (ref 0.1–1.0)
Monocytes Relative: 8 %
Neutro Abs: 6.9 10*3/uL (ref 1.7–7.7)
Neutrophils Relative %: 63 %
Platelets: 362 10*3/uL (ref 150–400)
RBC: 4.16 MIL/uL (ref 3.87–5.11)
RDW: 13.6 % (ref 11.5–15.5)
WBC: 11 10*3/uL — ABNORMAL HIGH (ref 4.0–10.5)
nRBC: 0 % (ref 0.0–0.2)

## 2018-12-01 LAB — TSH: TSH: 2.954 u[IU]/mL (ref 0.350–4.500)

## 2018-12-01 MED ORDER — RHO D IMMUNE GLOBULIN 1500 UNIT/2ML IJ SOSY
300.0000 ug | PREFILLED_SYRINGE | Freq: Once | INTRAMUSCULAR | Status: AC
  Administered 2018-12-01: 300 ug via INTRAMUSCULAR
  Filled 2018-12-01: qty 2

## 2018-12-01 MED ORDER — HYDROCORT-PRAMOXINE (PERIANAL) 1-1 % EX FOAM
1.0000 | Freq: Two times a day (BID) | CUTANEOUS | Status: DC
  Administered 2018-12-01 – 2018-12-04 (×4): 1 via RECTAL
  Filled 2018-12-01 (×7): qty 10

## 2018-12-01 NOTE — Progress Notes (Signed)
Result Addendum  EKG showed normal sinus rhythm  Results for orders placed or performed during the hospital encounter of 11/25/18 (from the past 24 hour(s))  TSH     Status: None   Collection Time: 12/01/18 12:26 PM  Result Value Ref Range   TSH 2.954 0.350 - 4.500 uIU/mL  CBC with Differential/Platelet     Status: Abnormal   Collection Time: 12/01/18 12:26 PM  Result Value Ref Range   WBC 11.0 (H) 4.0 - 10.5 K/uL   RBC 4.16 3.87 - 5.11 MIL/uL   Hemoglobin 12.7 12.0 - 15.0 g/dL   HCT 37.5 36.0 - 46.0 %   MCV 90.1 80.0 - 100.0 fL   MCH 30.5 26.0 - 34.0 pg   MCHC 33.9 30.0 - 36.0 g/dL   RDW 13.6 11.5 - 15.5 %   Platelets 362 150 - 400 K/uL   nRBC 0.0 0.0 - 0.2 %   Neutrophils Relative % 63 %   Neutro Abs 6.9 1.7 - 7.7 K/uL   Lymphocytes Relative 25 %   Lymphs Abs 2.7 0.7 - 4.0 K/uL   Monocytes Relative 8 %   Monocytes Absolute 0.9 0.1 - 1.0 K/uL   Eosinophils Relative 1 %   Eosinophils Absolute 0.1 0.0 - 0.5 K/uL   Basophils Relative 0 %   Basophils Absolute 0.0 0.0 - 0.1 K/uL   Immature Granulocytes 3 %   Abs Immature Granulocytes 0.36 (H) 0.00 - 0.07 K/uL  Comprehensive metabolic panel     Status: Abnormal   Collection Time: 12/01/18 12:26 PM  Result Value Ref Range   Sodium 134 (L) 135 - 145 mmol/L   Potassium 4.2 3.5 - 5.1 mmol/L   Chloride 103 98 - 111 mmol/L   CO2 22 22 - 32 mmol/L   Glucose, Bld 84 70 - 99 mg/dL   BUN 12 6 - 20 mg/dL   Creatinine, Ser 0.71 0.44 - 1.00 mg/dL   Calcium 9.2 8.9 - 10.3 mg/dL   Total Protein 7.2 6.5 - 8.1 g/dL   Albumin 2.7 (L) 3.5 - 5.0 g/dL   AST 15 15 - 41 U/L   ALT 23 0 - 44 U/L   Alkaline Phosphatase 81 38 - 126 U/L   Total Bilirubin 0.4 0.3 - 1.2 mg/dL   GFR calc non Af Amer >60 >60 mL/min   GFR calc Af Amer >60 >60 mL/min   Anion gap 9 5 - 15  Rh IG workup (includes ABO/Rh)     Status: None (Preliminary result)   Collection Time: 12/01/18  1:13 PM  Result Value Ref Range   Gestational Age(Wks) 27    ABO/RH(D) B NEG    Unit Number [id number]    Blood Component Type RHIG    Unit division 00    Status of Unit ISSUED    Transfusion Status      OK TO TRANSFUSE Performed at [facility], [address], [place], [place] [address]    Reactive NST, no fetal tachycardia  No overt sign of intrauterine infection. Discussed with Dr. Donalee Citrin (MFM), who agreed there is no indication now of Triple I, will continue to monitor closely. Patient informed. Will continue close observation and inpatient antenatal care.   Verita Schneiders, MD, [place] for [employer], [place]

## 2018-12-01 NOTE — Progress Notes (Signed)
Patient ID: Whitney Lowe, female   DOB: 1985-06-29, 33 y.o.   MRN: [id number] Whitney Lowe) NOTE  Whitney Lowe is a 33 y.o. G3P0020 at 39w5dby best clinical estimate who is admitted for rupture of membranes.   Fetal presentation is cephalic. Length of Stay:  6  Days  Subjective: Had small amount of BRB and small clot yesterday and today. Still leaking clear fluid but no other complaints. Feeling heart palpitations. Patient reports the fetal movement as active. Patient reports uterine contraction activity as none.  Vitals:  Blood pressure 99/75, pulse (!) 148, temperature 98.1 F (36.7 C), temperature source Oral, resp. rate 18, height 4' 10"  (1.473 m), weight 56.2 kg, last menstrual period 05/21/2018, SpO2 100 %.  Patient Vitals for the past 24 hrs:  BP Temp Temp src Pulse Resp SpO2  12/01/18 0855 99/75 98.1 F (36.7 C) Oral (!) 148 18 100 %  12/01/18 0555 (!) 85/47 98 F (36.7 C) Oral (!) 102 18 99 %  11/30/18 1920 99/67 98 F (36.7 C) Oral (!) 107 18 99 %  11/30/18 1641 100/63 97.8 F (36.6 C) Oral 95 17 99 %   Physical Examination: General appearance - alert, well appearing, and in no distress Chest - normal effort Abdomen - gravid, non-tender fundus Fundal Height:  size equals dates Extremities: Homans sign is negative, no sign of DVT  Membranes:ruptured, clear fluid  Fetal Monitoring:  Baseline: 155 bpm, Variability:  Moderate {> 6 bpm), Accelerations: Reactive and Decelerations: Absent Bedside scan: Cephalic presentation  Medications:  Scheduled . amoxicillin  500 mg Oral Q8H  . prenatal multivitamin  1 tablet Oral Q1200  . rho (d) immune globulin  300 mcg Intramuscular Once  . senna-docusate  2 tablet Oral BID  . sodium chloride flush  3 mL Intravenous Q12H   I have reviewed the patient's current medications.  ASSESSMENT: Principal Problem:   Preterm premature rupture of membranes (PPROM), second trimester, antepartum Active  Problems:   Crohn's ileocolitis (HCC)   Long-term use of immunosuppressant medication-Stelara   Supervision of high risk pregnancy, antepartum   Rubella non-immune status, antepartum   Rh negative, antepartum   Anemia affecting pregnancy, antepartum   Cervical mass   Fibroid uterus   Antepartum placental abruption   Tachycardia  PLAN: Concerned about tachycardia, will get EKG and labs for further evaluation Nontender uterus. Will put back on FHR monitor for now and monitor closely. Concerned about intrauterine infection which would be an indication for delivery; delivery also indicated for any worsening fetal/maternal status S/p BMZ x 2. Rh neg, Rhogam to be given today. Stable Crohns, last Stelara dose was on 11/23/18. Routine antenatal care for now. L&D and NICU aware of this possible situation.  UVerita Schneiders MD 12/01/2018,11:19 AM

## 2018-12-02 ENCOUNTER — Encounter: Payer: Medicaid Other | Admitting: Family Medicine

## 2018-12-02 ENCOUNTER — Encounter: Payer: Medicaid Other | Admitting: Obstetrics and Gynecology

## 2018-12-02 ENCOUNTER — Other Ambulatory Visit: Payer: Medicaid Other

## 2018-12-02 LAB — CBC WITH DIFFERENTIAL/PLATELET
Abs Immature Granulocytes: 0.38 10*3/uL — ABNORMAL HIGH (ref 0.00–0.07)
Basophils Absolute: 0 10*3/uL (ref 0.0–0.1)
Basophils Relative: 0 %
Eosinophils Absolute: 0.1 10*3/uL (ref 0.0–0.5)
Eosinophils Relative: 1 %
HCT: 39.3 % (ref 36.0–46.0)
Hemoglobin: 13.3 g/dL (ref 12.0–15.0)
Immature Granulocytes: 3 %
Lymphocytes Relative: 23 %
Lymphs Abs: 2.7 10*3/uL (ref 0.7–4.0)
MCH: 30.5 pg (ref 26.0–34.0)
MCHC: 33.8 g/dL (ref 30.0–36.0)
MCV: 90.1 fL (ref 80.0–100.0)
Monocytes Absolute: 0.8 10*3/uL (ref 0.1–1.0)
Monocytes Relative: 7 %
Neutro Abs: 7.8 10*3/uL — ABNORMAL HIGH (ref 1.7–7.7)
Neutrophils Relative %: 66 %
Platelets: 384 10*3/uL (ref 150–400)
RBC: 4.36 MIL/uL (ref 3.87–5.11)
RDW: 13.7 % (ref 11.5–15.5)
WBC: 11.9 10*3/uL — ABNORMAL HIGH (ref 4.0–10.5)
nRBC: 0 % (ref 0.0–0.2)

## 2018-12-02 LAB — RH IG WORKUP (INCLUDES ABO/RH)
ABO/RH(D): B NEG
Gestational Age(Wks): 27
Unit division: 0

## 2018-12-02 LAB — RPR: RPR Ser Ql: NONREACTIVE

## 2018-12-02 NOTE — Progress Notes (Signed)
Patient ID: Whitney Lowe, female   DOB: 05/18/1985, 33 y.o.   MRN: [id number] Patient ID: Whitney Lowe, female   DOB: 1985/07/14, 33 y.o.   MRN: [id number] [name]) NOTE  Whitney Lowe is a 33 y.o. G3P0020 at [redacted]w[redacted]d by best clinical estimate who is admitted for rupture of membranes.   Fetal presentation is cephalic. Length of Stay:  7  Days  Subjective: No bleeding or fluid gushes noted Patient reports the fetal movement as active. Patient reports uterine contraction activity as none.  Vitals:  Blood pressure (!) 92/59, pulse 96, temperature 98.2 F (36.8 C), temperature source Oral, resp. rate 18, height 4' 10"  (1.473 m), weight 56.2 kg, last menstrual period 05/21/2018, SpO2 99 %.  Patient Vitals for the past 24 hrs:  BP Temp Temp src Pulse Resp SpO2  12/02/18 0426 (!) 92/59 98.2 F (36.8 C) Oral 96 18 99 %  12/01/18 1955 102/65 98.5 F (36.9 C) Oral (!) 105 18 100 %  12/01/18 1654 103/65 98.3 F (36.8 C) Oral (!) 106 18 100 %  12/01/18 1234 - - - (!) 103 - -  12/01/18 1156 116/75 98.1 F (36.7 C) Oral (!) 113 - 100 %  12/01/18 1129 - - - (!) 109 - 100 %  12/01/18 1125 - - - 95 - 99 %  12/01/18 0855 99/75 98.1 F (36.7 C) Oral (!) 148 18 100 %   Physical Examination: General appearance - alert, well appearing, and in no distress Chest - normal effort Abdomen - gravid, non-tender fundus Fundal Height:  size equals dates Extremities: Homans sign is negative, no sign of DVT  Membranes:ruptured, clear fluid  Fetal Monitoring:  Baseline: 150s bpm, Variability:  Moderate {> 6 bpm), Accelerations: Reactive and Decelerations: Absent  12/02/2018 7:17 AM  Bedside scan: Cephalic presentation  Medications:  Scheduled . amoxicillin  500 mg Oral Q8H  . hydrocortisone-pramoxine  1 applicator Rectal BID  . prenatal multivitamin  1 tablet Oral Q1200  . senna-docusate  2 tablet Oral BID  . sodium chloride flush  3 mL Intravenous Q12H   I have reviewed the  patient's current medications.  ASSESSMENT: Principal Problem:   Preterm premature rupture of membranes (PPROM), second trimester, antepartum Active Problems:   Crohn's ileocolitis (HCC)   Long-term use of immunosuppressant medication-Stelara   Supervision of high risk pregnancy, antepartum   Rubella non-immune status, antepartum   Rh negative, antepartum   Anemia affecting pregnancy, antepartum   Cervical mass   Fibroid uterus   Antepartum placental abruption   Tachycardia  PLAN: Pulse improved with fluids, EKG normal Nontender uterus. Will put back on FHR monitor for now and monitor closely. No evidence of III, continue latency antibiotics S/p BMZ x 2. Rh neg, Rhogam given Stable Crohns, last Stelara dose was on 11/23/18. Continue in house management, await labor, evidence of infection, fetal status deterioration or [redacted] weeks gestation for delivery timing indications  LFlorian Buff MD 12/02/2018,7:14 AM

## 2018-12-03 NOTE — Progress Notes (Signed)
Patient ID: Whitney Lowe, female   DOB: Oct 08, 1985, 33 y.o.   MRN: [id number] [place]) NOTE  Whitney Lowe is a 33 y.o. G3P0020 at 28w0dby best clinical estimate who is admitted for rupture of membranes.   Fetal presentation is cephalic. Length of Stay:  8  Days  Subjective: No complaints. Patient reports the fetal movement as active. Patient reports uterine contraction  activity as none. Patient reports  vaginal bleeding as scant staining. Patient describes fluid per vagina as none.  Vitals:  Blood pressure 100/67, pulse 98, temperature 98.4 F (36.9 C), temperature source Oral, resp. rate 18, height 4' 10"  (1.473 m), weight 56.2 kg, last menstrual period 05/21/2018, SpO2 98 %. Physical Examination:  eneral appearance - alert, well appearing, and in no distress Chest - normal effort Abdomen - gravid, non-tender Fundal Height:  size equals dates Extremities: Homans sign is negative, no sign of DVT  Membranes:ruptured, clear fluid  Fetal Monitoring:  Baseline: 150 bpm, Variability: Moderate {> 6 bpm), Accelerations: Reactive and Decelerations: Absent  Medications:  Scheduled . hydrocortisone-pramoxine  1 applicator Rectal BID  . prenatal multivitamin  1 tablet Oral Q1200  . senna-docusate  2 tablet Oral BID  . sodium chloride flush  3 mL Intravenous Q12H   I have reviewed the patient's current medications.  ASSESSMENT: Principal Problem:   Preterm premature rupture of membranes (PPROM), second trimester, antepartum Active Problems:   Crohn's ileocolitis (HCC)   Long-term use of immunosuppressant medication-Stelara   Supervision of high risk pregnancy, antepartum   Rubella non-immune status, antepartum   Rh negative, antepartum   Anemia affecting pregnancy, antepartum   Cervical mass   Fibroid uterus   Antepartum placental abruption   Tachycardia   PLAN: Inpatient until delivery Delivery with any signs/symnptoms of infection or  worsening fetal/maternal status S/p BMZ; Mag for CP ppx prior to delivery Stable Crohns, last Stelara dose was on 11/23/18. Routine antenatal care  UVerita Schneiders MD 12/03/2018,11:20 AM

## 2018-12-04 NOTE — Progress Notes (Signed)
Patient ID: Whitney Lowe, female   DOB: [birth year], 33 y.o.   MRN: [id number] [place]) NOTE  Whitney Lowe is a 33 y.o. G3P0020 at 65w1dby best clinical estimate who is admitted for rupture of membranes.   Fetal presentation is cephalic. Length of Stay:  9  Days  Subjective: No medical complaints. [facility], she will contact Patient Experience Patient reports the fetal movement as active. Patient reports uterine contraction  activity as none. Patient reports  vaginal bleeding as none Patient describes fluid per vagina as none.  Vitals:  Blood pressure 98/75, pulse (!) 105, temperature 98.2 F (36.8 C), temperature source Oral, resp. rate 17, height 4' 10"  (1.473 m), weight 56.2 kg, last menstrual period 05/21/2018, SpO2 100 %. Physical Examination:  eneral appearance - alert, well appearing, and in no distress Chest - normal effort Abdomen - gravid, non-tender Fundal Height:  size equals dates Extremities: Homans sign is negative, no sign of DVT  Membranes:ruptured, clear fluid  Fetal Monitoring:  Baseline: 150 bpm, Variability: Moderate {> 6 bpm), Accelerations: Reactive and Decelerations: Absent  Medications:  Scheduled . hydrocortisone-pramoxine  1 applicator Rectal BID  . prenatal multivitamin  1 tablet Oral Q1200  . senna-docusate  2 tablet Oral BID  . sodium chloride flush  3 mL Intravenous Q12H   I have reviewed the patient's current medications.  ASSESSMENT: Principal Problem:   Preterm premature rupture of membranes (PPROM), second trimester, antepartum Active Problems:   Crohn's ileocolitis (HCC)   Long-term use of immunosuppressant medication-Stelara   Supervision of high risk pregnancy, antepartum   Rubella non-immune status, antepartum   Rh negative, antepartum   Anemia affecting pregnancy, antepartum   Cervical mass   Fibroid uterus   Antepartum placental abruption  Tachycardia   PLAN: Inpatient until delivery Delivery with any signs/symnptoms of infection or worsening fetal/maternal status S/p BMZ; Mag for CP ppx prior to delivery Stable Crohns, last Stelara dose was on 11/23/18. Routine antenatal care  UVerita Schneiders MD 12/04/2018,7:28 AM

## 2018-12-05 DIAGNOSIS — Z3A28 28 weeks gestation of pregnancy: Secondary | ICD-10-CM

## 2018-12-05 DIAGNOSIS — O42113 Preterm premature rupture of membranes, onset of labor more than 24 hours following rupture, third trimester: Secondary | ICD-10-CM

## 2018-12-05 LAB — CBC WITH DIFFERENTIAL/PLATELET
Abs Immature Granulocytes: 0.13 10*3/uL — ABNORMAL HIGH (ref 0.00–0.07)
Basophils Absolute: 0 10*3/uL (ref 0.0–0.1)
Basophils Relative: 0 %
Eosinophils Absolute: 0.1 10*3/uL (ref 0.0–0.5)
Eosinophils Relative: 0 %
HCT: 36.2 % (ref 36.0–46.0)
Hemoglobin: 12 g/dL (ref 12.0–15.0)
Immature Granulocytes: 1 %
Lymphocytes Relative: 21 %
Lymphs Abs: 2.3 10*3/uL (ref 0.7–4.0)
MCH: 30.3 pg (ref 26.0–34.0)
MCHC: 33.1 g/dL (ref 30.0–36.0)
MCV: 91.4 fL (ref 80.0–100.0)
Monocytes Absolute: 0.5 10*3/uL (ref 0.1–1.0)
Monocytes Relative: 4 %
Neutro Abs: 8.2 10*3/uL — ABNORMAL HIGH (ref 1.7–7.7)
Neutrophils Relative %: 74 %
Platelets: 333 10*3/uL (ref 150–400)
RBC: 3.96 MIL/uL (ref 3.87–5.11)
RDW: 13.7 % (ref 11.5–15.5)
WBC: 11.2 10*3/uL — ABNORMAL HIGH (ref 4.0–10.5)
nRBC: 0 % (ref 0.0–0.2)

## 2018-12-05 LAB — TYPE AND SCREEN
ABO/RH(D): B NEG
Antibody Screen: POSITIVE
Donor AG Type: NEGATIVE
Donor AG Type: NEGATIVE
Unit division: 0
Unit division: 0

## 2018-12-05 LAB — BPAM RBC
Blood Product Expiration Date: 202009032359
Blood Product Expiration Date: 202009052359
Unit Type and Rh: 1700
Unit Type and Rh: 1700

## 2018-12-05 NOTE — Progress Notes (Signed)
Patient ID: Whitney Lowe, female   DOB: 05/07/1985, 33 y.o.   MRN: [id number] [place]) NOTE  Whitney Lowe is a 33 y.o. G3P0020 at 72w2dby best clinical estimate who is admitted for rupture of membranes.   Fetal presentation is cephalic. Length of Stay:  10  Days  Subjective: No medical complaints and was found ambulating around the room Patient reports the fetal movement as active. Patient reports uterine contraction  activity as none. Patient reports  vaginal bleeding as none Patient describes fluid per vagina as pink tinged.  Vitals:  Blood pressure (!) 91/53, pulse (!) 101, temperature 98 F (36.7 C), temperature source Oral, resp. rate 17, height 4' 10"  (1.473 m), weight 56.2 kg, last menstrual period 05/21/2018, SpO2 99 %. Physical Examination:  eneral appearance - alert, well appearing, and in no distress Chest - normal effort Abdomen - gravid, non-tender Fundal Height:  size equals dates Extremities: Homans sign is negative, no sign of DVT  Membranes:ruptured, clear fluid  Fetal Monitoring:  Baseline: 130 bpm, Variability: Moderate {> 6 bpm), Accelerations: Reactive and Decelerations: Absent  Medications:  Scheduled . hydrocortisone-pramoxine  1 applicator Rectal BID  . prenatal multivitamin  1 tablet Oral Q1200  . senna-docusate  2 tablet Oral BID  . sodium chloride flush  3 mL Intravenous Q12H   I have reviewed the patient's current medications.  ASSESSMENT: Principal Problem:   Preterm premature rupture of membranes (PPROM), second trimester, antepartum Active Problems:   Crohn's ileocolitis (HCC)   Long-term use of immunosuppressant medication-Stelara   Supervision of high risk pregnancy, antepartum   Rubella non-immune status, antepartum   Rh negative, antepartum   Anemia affecting pregnancy, antepartum   Cervical mass   Fibroid uterus   Antepartum placental abruption   Tachycardia   PLAN: Inpatient until  delivery Delivery with any signs/symnptoms of infection or worsening fetal/maternal status S/p BMZ; Mag for CP ppx prior to delivery Stable Crohns, last Stelara dose was on 11/23/18. Routine antenatal care Plan for diabetes screening next week  PMora Bellman MD 12/05/2018,7:45 AM

## 2018-12-06 ENCOUNTER — Inpatient Hospital Stay (HOSPITAL_COMMUNITY): Payer: Medicaid Other

## 2018-12-06 DIAGNOSIS — O99013 Anemia complicating pregnancy, third trimester: Secondary | ICD-10-CM

## 2018-12-06 DIAGNOSIS — O3413 Maternal care for benign tumor of corpus uteri, third trimester: Secondary | ICD-10-CM

## 2018-12-06 DIAGNOSIS — Z3A28 28 weeks gestation of pregnancy: Secondary | ICD-10-CM

## 2018-12-06 DIAGNOSIS — O99613 Diseases of the digestive system complicating pregnancy, third trimester: Secondary | ICD-10-CM

## 2018-12-06 DIAGNOSIS — O42913 Preterm premature rupture of membranes, unspecified as to length of time between rupture and onset of labor, third trimester: Secondary | ICD-10-CM

## 2018-12-06 DIAGNOSIS — O360130 Maternal care for anti-D [Rh] antibodies, third trimester, not applicable or unspecified: Secondary | ICD-10-CM

## 2018-12-06 NOTE — Progress Notes (Signed)
Initial Nutrition Assessment  DOCUMENTATION CODES:  Not applicable  INTERVENTION:  Regular diet Snacks TID and double protein upon request  NUTRITION DIAGNOSIS:  Increased nutrient needs related to (pregnancy and fetal growth requirments) as evidenced by (28 weeks IUP).   GOAL:  Patient will meet greater than or equal to 90% of their needs   MONITOR:  Weight trends  REASON FOR ASSESSMENT:  Antenatal   ASSESSMENT:  28 3/7 weeks, adm due to PROM. Hx of Crohn's currently in remission. Wt at initial PN visit 98 Lbs, BMI 19.5. 25 Lbs weight gain to date. 2 Hr GTT tomorrow   Diet Order:   Diet Order            Diet NPO time specified  Diet effective midnight        Diet regular Room service appropriate? Yes; Fluid consistency: Thin  Diet effective now              EDUCATION NEEDS:   No education needs have been identified at this time  Skin:  Skin Assessment: Reviewed RN Assessment   Height:   Ht Readings from Last 1 Encounters:  11/25/18 4' 10"  (1.473 m)    Weight:   Wt Readings from Last 1 Encounters:  11/25/18 56.2 kg    Ideal Body Weight:   95 lbs  BMI:  Body mass index is 25.92 kg/m.  Estimated Nutritional Needs:   Kcal:  [phone]  Protein:  68 - 78 g  Fluid:  1.8 L    [name] M.Fredderick Severance LDN Neonatal Nutrition Support Specialist/RD III Pager [phone]      Phone [phone]

## 2018-12-06 NOTE — Progress Notes (Signed)
Patient ID: Whitney Lowe, female   DOB: 1985-06-01, 33 y.o.   MRN: [id number] [place] ANTEPARTUM COMPREHENSIVE PROGRESS NOTE  Whitney Lowe is a 33 y.o. G3P0020 at [redacted]w[redacted]d who is admitted for rupture of membranes.   Fetal presentation is cephalic. Length of Stay:  11  Days  Subjective: No complaints today.  Patient reports good fetal movement.  She reports no uterine contractions, no bleeding. Still leaking fluid. + FM.   Vitals:  Blood pressure (!) 93/59, pulse (!) 113, temperature 98.2 F (36.8 C), temperature source Oral, resp. rate 18, height 4' 10"  (1.473 m), weight 56.2 kg, last menstrual period 05/21/2018, SpO2 99 %. Physical Examination: Lungs clear Heart RRR Abd soft + BS gravid non tender  Fetal Monitoring:  150's, + accels, no ut ctx  Labs:  No results found for this or any previous visit (from the past 24 hour(s)).  Imaging Studies:    U/S today, results pending   Medications:  Scheduled . hydrocortisone-pramoxine  1 applicator Rectal BID  . prenatal multivitamin  1 tablet Oral Q1200  . senna-docusate  2 tablet Oral BID  . sodium chloride flush  3 mL Intravenous Q12H   I have reviewed the patient's current medications.  ASSESSMENT: IUP 28 3/7 weeks PROM, s/p BMZ and antibiotics Rh negative Rubella, non immune Crohn's ileocolitis Uterine fibroids  PLAN: Stable. No S/Sx of infection or labor. Delivery at 34 weeks or for maternal fetal indication. Glucola tomorrow Last dose Stelara on 11/23/18 not due til 01/11/19. Pt self's administers.  Continue routine antenatal care.   MChancy Milroy8/17/2020,10:52 AM

## 2018-12-07 LAB — GLUCOSE, 2 HOUR: Glucose, 2 hour: 157 mg/dL — ABNORMAL HIGH (ref 70–139)

## 2018-12-07 LAB — GLUCOSE TOLERANCE, 1 HOUR: Glucose, 1 Hour GTT: 82 mg/dL (ref 70–140)

## 2018-12-07 LAB — GLUCOSE, FASTING: Glucose, Fasting: 82 mg/dL (ref 70–99)

## 2018-12-07 NOTE — Plan of Care (Signed)
  Problem: Health Behavior/Discharge Planning: Goal: Ability to manage health-related needs will improve Outcome: Progressing   Problem: Clinical Measurements: Goal: Will remain free from infection Outcome: Progressing   

## 2018-12-07 NOTE — Progress Notes (Signed)
Patient ID: Whitney Lowe, female   DOB: 04-11-1986, 33 y.o.   MRN: [id number] [place] ANTEPARTUM COMPREHENSIVE PROGRESS NOTE  Whitney Lowe is a 33 y.o. G3P0020 at [redacted]w[redacted]d who is admitted for PROM.   Fetal presentation is cephalic. U/S 12/06/18 Length of Stay:  12  Days  Subjective: No complaints today. Patient reports good fetal movement.  She reports no uterine contractions, no bleeding. Still some leaking  Vitals:  Blood pressure 94/64, pulse (!) 113, temperature 98.1 F (36.7 C), temperature source Oral, resp. rate 18, height 4' 10"  (1.473 m), weight 56.2 kg, last menstrual period 05/21/2018, SpO2 100 %.   Physical Examination: Lungs clear Heart RRR Abd soft + BS gravid non tender Ext non tender  Fetal Monitoring:  150's, good variabilty  Labs:  Results for orders placed or performed during the hospital encounter of 11/25/18 (from the past 24 hour(s))  Glucose, fasting   Collection Time: 12/07/18  6:18 AM  Result Value Ref Range   Glucose, Fasting 82 70 - 99 mg/dL  Glucose tolerance, 1 hour   Collection Time: 12/07/18  6:18 AM  Result Value Ref Range   Glucose, 1 Hour GTT 82 70 - 140 mg/dL  Glucose, 2 hour   Collection Time: 12/07/18  7:55 AM  Result Value Ref Range   Glucose, 2 hour 157 (H) 70 - 139 mg/dL    Imaging Studies:    Limited U/S yesterday   Medications:  Scheduled . hydrocortisone-pramoxine  1 applicator Rectal BID  . prenatal multivitamin  1 tablet Oral Q1200  . senna-docusate  2 tablet Oral BID  . sodium chloride flush  3 mL Intravenous Q12H   I have reviewed the patient's current medications.  ASSESSMENT: IUP 28 4/7 Prom, s/p BMZ and antibiotics Rh negative Rubella, non immune Crohn's ileocolitis Uterine fibroids  PLAN: Stable Glucola was not collected properly. Will repeat later this week No S/Sx of infection. Delivery for maternal/fetal indications or at 34 weeks Continue with antenatal care  MChancy Milroy8/18/2020,12:03  PM

## 2018-12-08 LAB — CBC WITH DIFFERENTIAL/PLATELET
Abs Immature Granulocytes: 0.1 10*3/uL — ABNORMAL HIGH (ref 0.00–0.07)
Basophils Absolute: 0 10*3/uL (ref 0.0–0.1)
Basophils Relative: 0 %
Eosinophils Absolute: 0.1 10*3/uL (ref 0.0–0.5)
Eosinophils Relative: 1 %
HCT: 34.5 % — ABNORMAL LOW (ref 36.0–46.0)
Hemoglobin: 11.5 g/dL — ABNORMAL LOW (ref 12.0–15.0)
Immature Granulocytes: 1 %
Lymphocytes Relative: 22 %
Lymphs Abs: 2.2 10*3/uL (ref 0.7–4.0)
MCH: 30.3 pg (ref 26.0–34.0)
MCHC: 33.3 g/dL (ref 30.0–36.0)
MCV: 91 fL (ref 80.0–100.0)
Monocytes Absolute: 0.5 10*3/uL (ref 0.1–1.0)
Monocytes Relative: 5 %
Neutro Abs: 7 10*3/uL (ref 1.7–7.7)
Neutrophils Relative %: 71 %
Platelets: 322 10*3/uL (ref 150–400)
RBC: 3.79 MIL/uL — ABNORMAL LOW (ref 3.87–5.11)
RDW: 13.4 % (ref 11.5–15.5)
WBC: 10 10*3/uL (ref 4.0–10.5)
nRBC: 0 % (ref 0.0–0.2)

## 2018-12-08 NOTE — Progress Notes (Signed)
Patient ID: Whitney Lowe, female   DOB: 1985/09/21, 33 y.o.   MRN: [id number] [place] ANTEPARTUM COMPREHENSIVE PROGRESS NOTE  Whitney Lowe is a 33 y.o. G3P0020 at [redacted]w[redacted]d who is admitted for PROM.   Fetal presentation is cephalic.U/S 12/06/18 Length of Stay:  13  Days  Subjective: No complaints today. Tolerating diet Patient reports good fetal movement.  She reports no uterine contractions, no bleeding. Still some loss of fluid per vagina.  Vitals:  Blood pressure (!) 97/54, pulse (!) 101, temperature 98 F (36.7 C), temperature source Oral, resp. rate 18, height 4' 10"  (1.473 m), weight 56.2 kg, last menstrual period 05/21/2018, SpO2 99 %. Physical Examination: Lungs clear Heart RRR Abd soft +BS non tender Ext non tender   Fetal Monitoring:  150's, good variablity for GA, no ut ctx  Labs:  Results for orders placed or performed during the hospital encounter of 11/25/18 (from the past 24 hour(s))  CBC with Differential/Platelet   Collection Time: 12/08/18 11:26 AM  Result Value Ref Range   WBC 10.0 4.0 - 10.5 K/uL   RBC 3.79 (L) 3.87 - 5.11 MIL/uL   Hemoglobin 11.5 (L) 12.0 - 15.0 g/dL   HCT 34.5 (L) 36.0 - 46.0 %   MCV 91.0 80.0 - 100.0 fL   MCH 30.3 26.0 - 34.0 pg   MCHC 33.3 30.0 - 36.0 g/dL   RDW 13.4 11.5 - 15.5 %   Platelets 322 150 - 400 K/uL   nRBC 0.0 0.0 - 0.2 %   Neutrophils Relative % 71 %   Neutro Abs 7.0 1.7 - 7.7 K/uL   Lymphocytes Relative 22 %   Lymphs Abs 2.2 0.7 - 4.0 K/uL   Monocytes Relative 5 %   Monocytes Absolute 0.5 0.1 - 1.0 K/uL   Eosinophils Relative 1 %   Eosinophils Absolute 0.1 0.0 - 0.5 K/uL   Basophils Relative 0 %   Basophils Absolute 0.0 0.0 - 0.1 K/uL   Immature Granulocytes 1 %   Abs Immature Granulocytes 0.10 (H) 0.00 - 0.07 K/uL    Imaging Studies:    NA    Medications:  Scheduled . hydrocortisone-pramoxine  1 applicator Rectal BID  . prenatal multivitamin  1 tablet Oral Q1200  . senna-docusate  2 tablet Oral BID  .  sodium chloride flush  3 mL Intravenous Q12H   I have reviewed the patient's current medications.  ASSESSMENT: IUP 28 5/7 PROM, s/p BMZ and antibiotics Rh negative Rubella, non immune Crohn's ileocolitis Uterine fibroids  PLAN: Stable No S/Sx of infection. Delivery for maternal/fetal indications or at 34 weeks Continue routine antenatal care.   MChancy Milroy8/19/2020,12:09 PM

## 2018-12-08 NOTE — Plan of Care (Signed)
  Problem: Clinical Measurements: Goal: Will remain free from infection Outcome: Progressing   Problem: Education: Goal: Knowledge of disease or condition will improve Outcome: Progressing

## 2018-12-09 LAB — TYPE AND SCREEN
ABO/RH(D): B NEG
Antibody Screen: POSITIVE
Unit division: 0
Unit division: 0

## 2018-12-09 LAB — BPAM RBC
Blood Product Expiration Date: 202009032359
Blood Product Expiration Date: 202009052359
Unit Type and Rh: 1700
Unit Type and Rh: 1700

## 2018-12-09 NOTE — Progress Notes (Addendum)
Patient ID: Maxi Carreras, female   DOB: 11-Aug-1985, 33 y.o.   MRN: [id number] [place] ANTEPARTUM COMPREHENSIVE PROGRESS NOTE  Rashanda Magloire is a 33 y.o. G3P0020 at [redacted]w[redacted]d who is admitted for PROM.   Fetal presentation is cephalic. U/S 12/06/18 Length of Stay:  14  Days  Subjective: Lower a little sore in lower abd near her fibroid. No cramps or contractions. + FM. No bleeding. Still occ LOF.  Vitals:  Blood pressure 93/63, pulse (!) 112, temperature 98.1 F (36.7 C), temperature source Oral, resp. rate 18, height 4' 10"  (1.473 m), weight 56.2 kg, last menstrual period 05/21/2018, SpO2 100 %. Physical Examination: Lungs clear  Heart RRR Abd soft + BS gravid non tender Ext non tender  Fetal Monitoring:  140's-150's good variablity for GA, no ut ctx  Labs:  Results for orders placed or performed during the hospital encounter of 11/25/18 (from the past 24 hour(s))  Type and screen   Collection Time: 12/09/18  7:30 AM  Result Value Ref Range   ABO/RH(D) B NEG    Antibody Screen POS    Sample Expiration 12/12/2018,2359    Antibody Identification PASSIVELY ACQUIRED ANTI-D    Unit Number [id number]   Blood Component Type RBC LR PHER2    Unit division 00    Status of Unit ALLOCATED    Transfusion Status OK TO TRANSFUSE    Crossmatch Result COMPATIBLE    Unit Number [id number]   Blood Component Type RED CELLS,LR    Unit division 00    Status of Unit ALLOCATED    Transfusion Status OK TO TRANSFUSE    Crossmatch Result COMPATIBLE   BPAM RBC   Collection Time: 12/09/18  7:30 AM  Result Value Ref Range   Blood Product Unit Number [id number]   PRODUCT CODE [id number]   Unit Type and Rh 1700    Blood Product Expiration Date 2165537482707   Blood Product Unit Number [id number]   PRODUCT CODE [id number]   Unit Type and Rh 1700    Blood Product Expiration Date 2325498264158    Imaging Studies:        Medications:  Scheduled . hydrocortisone-pramoxine  1  applicator Rectal BID  . prenatal multivitamin  1 tablet Oral Q1200  . senna-docusate  2 tablet Oral BID  . sodium chloride flush  3 mL Intravenous Q12H   I have reviewed the patient's current medications.  ASSESSMENT: IUP 28 6/7 PROM. S/p BMZ and antibiotics Rh negative Rubella, non immune Crohn's ileocolitis  PLAN Stable No S/Sx of infection at present  Delivery for maternal/fetal indications or at 34 weeks  Continue routine antenatal care.   MChancy Milroy8/20/2020,11:40 AM

## 2018-12-10 LAB — GLUCOSE, 2 HOUR: Glucose, 2 hour: 122 mg/dL (ref 70–139)

## 2018-12-10 LAB — GLUCOSE, FASTING: Glucose, Fasting: 67 mg/dL — ABNORMAL LOW (ref 70–99)

## 2018-12-10 LAB — GLUCOSE TOLERANCE, 1 HOUR: Glucose, 1 Hour GTT: 143 mg/dL — ABNORMAL HIGH (ref 70–140)

## 2018-12-10 NOTE — Progress Notes (Signed)
Patient ID: Whitney Lowe, female   DOB: 1985/10/13, 33 y.o.   MRN: [id number] [place] ANTEPARTUM COMPREHENSIVE PROGRESS NOTE  Haylin Camilli is a 33 y.o. G3P0020 at [redacted]w[redacted]d who is admitted for PROM.   Fetal presentation is cephalic. U/S 12/06/18 Length of Stay:  15  Days  Subjective: No complaints today. Patient reports good fetal movement.  She reports no uterine contractions or bleeding. Occ LOF  Vitals:  Blood pressure 94/61, pulse (!) 108, temperature 97.9 F (36.6 C), temperature source Oral, resp. rate 16, height 4' 10"  (1.473 m), weight 56.2 kg, last menstrual period 05/21/2018, SpO2 100 %. Physical Examination: Lungs clear Heart RRR Abd soft + BS gravid non tender Ext non tender  Fetal Monitoring:  150's, good variabilty for GA, no ut ctx  Labs:  Results for orders placed or performed during the hospital encounter of 11/25/18 (from the past 24 hour(s))  Glucose, fasting   Collection Time: 12/10/18  6:47 AM  Result Value Ref Range   Glucose, Fasting 67 (L) 70 - 99 mg/dL  Glucose tolerance, 1 hour   Collection Time: 12/10/18  6:47 AM  Result Value Ref Range   Glucose, 1 Hour GTT 143 (H) 70 - 140 mg/dL  Glucose, 2 hour   Collection Time: 12/10/18  8:55 AM  Result Value Ref Range   Glucose, 2 hour 122 70 - 139 mg/dL    Imaging Studies:    NA   Medications:  Scheduled . hydrocortisone-pramoxine  1 applicator Rectal BID  . prenatal multivitamin  1 tablet Oral Q1200  . senna-docusate  2 tablet Oral BID  . sodium chloride flush  3 mL Intravenous Q12H   I have reviewed the patient's current medications.  ASSESSMENT: IUP 29 weeks PROM, s/p BMX and antibiotics Rh negative Rubella, non immune Crohn's ileocolitis, S/P Stelara on 11/23/18 next dose 01/18/19  PLAN: Stable. Passed 2 hr glucola today. No S/Sx of infection. Delivery for maternal/fetal indications or at 34 weeks. Continue routine antenatal care.   MChancy Milroy8/21/2020,11:55 AM

## 2018-12-10 NOTE — Plan of Care (Signed)
Seems to be coping well.Support dog visits daily.Patient in good spirits and cooperates in her care.

## 2018-12-10 NOTE — Progress Notes (Signed)
1 hour GTT not drawn at 0750. RN called phlebotomy at 0800 for STAT draw.  Phlebotomy at bedside at [place] for blood draw.

## 2018-12-10 NOTE — Progress Notes (Signed)
Notified of final GTT results from [facility] from the Lab: Fasting: 67 1 hour: 143 2 hour: 122  Dr Rip Harbour in department and notified

## 2018-12-11 LAB — CBC WITH DIFFERENTIAL/PLATELET
Abs Immature Granulocytes: 0.08 10*3/uL — ABNORMAL HIGH (ref 0.00–0.07)
Basophils Absolute: 0 10*3/uL (ref 0.0–0.1)
Basophils Relative: 0 %
Eosinophils Absolute: 0.2 10*3/uL (ref 0.0–0.5)
Eosinophils Relative: 2 %
HCT: 35.3 % — ABNORMAL LOW (ref 36.0–46.0)
Hemoglobin: 11.7 g/dL — ABNORMAL LOW (ref 12.0–15.0)
Immature Granulocytes: 1 %
Lymphocytes Relative: 21 %
Lymphs Abs: 2.3 10*3/uL (ref 0.7–4.0)
MCH: 30.2 pg (ref 26.0–34.0)
MCHC: 33.1 g/dL (ref 30.0–36.0)
MCV: 91.2 fL (ref 80.0–100.0)
Monocytes Absolute: 0.6 10*3/uL (ref 0.1–1.0)
Monocytes Relative: 6 %
Neutro Abs: 7.6 10*3/uL (ref 1.7–7.7)
Neutrophils Relative %: 70 %
Platelets: 324 10*3/uL (ref 150–400)
RBC: 3.87 MIL/uL (ref 3.87–5.11)
RDW: 13.7 % (ref 11.5–15.5)
WBC: 10.9 10*3/uL — ABNORMAL HIGH (ref 4.0–10.5)
nRBC: 0 % (ref 0.0–0.2)

## 2018-12-12 NOTE — Progress Notes (Signed)
FACULTY PRACTICE ANTEPARTUM PROGRESS NOTE  Whitney Lowe is a 33 y.o. G3P0020 at 55w2dwho is admitted for PROM.  Estimated Date of Delivery: 02/25/19 Fetal presentation is cephalic.  Length of Stay:  17 Days. Admitted 11/25/2018  Subjective:  Patient reports normal fetal movement.  She denies uterine contractions, reports small amount pink discharge. Denies fever/chills  Vitals:  Blood pressure 94/61, pulse (!) 103, temperature 98.1 F (36.7 C), temperature source Oral, resp. rate 18, height 4' 10"  (1.473 m), weight 56.2 kg, last menstrual period 05/21/2018, SpO2 100 %. Physical Examination: CONSTITUTIONAL: Well-developed, well-nourished female in no acute distress.  HENT:  Normocephalic, atraumatic, External right and left ear normal. Oropharynx is clear and moist EYES: Conjunctivae and EOM are normal. Pupils are equal, round, and reactive to light. No scleral icterus.  NECK: Normal range of motion, supple, no masses. SKIN: Skin is warm and dry. No rash noted. Not diaphoretic. No erythema. No pallor. [place] Alert and oriented to person, place, and time. Normal reflexes, muscle tone coordination. No cranial nerve deficit noted. PSYCHIATRIC: Normal mood and affect. Normal behavior. Normal judgment and thought content. CARDIOVASCULAR: Normal heart rate noted RESPIRATORY: Effort normal, no problems with respiration noted MUSCULOSKELETAL: Normal range of motion. No edema and no tenderness. ABDOMEN: Soft, mildly tender in fundus, patient reports it "feels weird" but denies pain, gravid. CERVIX: deferred  Fetal monitoring: FHR: 150 bpm, Variability: moderate, Accelerations: Present, Decelerations: Absent  Uterine activity: no contractions per hour  Results for orders placed or performed during the hospital encounter of 11/25/18 (from the past 48 hour(s))  Glucose, 2 hour     Status: None   Collection Time: 12/10/18  8:55 AM  Result Value Ref Range   Glucose, 2 hour 122 70 - 139 mg/dL   Comment: [facility]formed at [facility] [address], [place] [address] CBC with Differential/Platelet     Status: Abnormal   Collection Time: 12/11/18 11:34 AM  Result Value Ref Range   WBC 10.9 (H) 4.0 - 10.5 K/uL   RBC 3.87 3.87 - 5.11 MIL/uL   Hemoglobin 11.7 (L) 12.0 - 15.0 g/dL   HCT 35.3 (L) 36.0 - 46.0 %   MCV 91.2 80.0 - 100.0 fL   MCH 30.2 26.0 - 34.0 pg   MCHC 33.1 30.0 - 36.0 g/dL   RDW 13.7 11.5 - 15.5 %   Platelets 324 150 - 400 K/uL   nRBC 0.0 0.0 - 0.2 %   Neutrophils Relative % 70 %   Neutro Abs 7.6 1.7 - 7.7 K/uL   Lymphocytes Relative 21 %   Lymphs Abs 2.3 0.7 - 4.0 K/uL   Monocytes Relative 6 %   Monocytes Absolute 0.6 0.1 - 1.0 K/uL   Eosinophils Relative 2 %   Eosinophils Absolute 0.2 0.0 - 0.5 K/uL   Basophils Relative 0 %   Basophils Absolute 0.0 0.0 - 0.1 K/uL   Immature Granulocytes 1 %   Abs Immature Granulocytes 0.08 (H) 0.00 - 0.07 K/uL    Comment: Performed at [facility], [place] [address]   I have reviewed the patient's current medications.  ASSESSMENT: Principal Problem:   Preterm premature rupture of membranes (PPROM), second trimester, antepartum Active Problems:   Crohn's ileocolitis ([place]   Long-term use of immunosuppressant medication-Stelara   Supervision of high risk pregnancy, antepartum   Rubella non-immune status, antepartum   Rh negative, antepartum   Anemia affecting pregnancy, antepartum   Cervical  mass   Fibroid uterus   Antepartum placental abruption   Tachycardia   PLAN: S/p BTMA S/p latency antibiotics   Continue routine antenatal care.   Feliz Beam, M.D. Attending Center for [employer] (Faculty Practice)  12/12/2018 8:48 AM

## 2018-12-13 DIAGNOSIS — Z3A29 29 weeks gestation of pregnancy: Secondary | ICD-10-CM

## 2018-12-13 LAB — BPAM RBC
Blood Product Expiration Date: 202009032359
Blood Product Expiration Date: 202009052359
Unit Type and Rh: 1700
Unit Type and Rh: 1700

## 2018-12-13 LAB — CBC WITH DIFFERENTIAL/PLATELET
Abs Immature Granulocytes: 0.06 10*3/uL (ref 0.00–0.07)
Basophils Absolute: 0 10*3/uL (ref 0.0–0.1)
Basophils Relative: 0 %
Eosinophils Absolute: 0.3 10*3/uL (ref 0.0–0.5)
Eosinophils Relative: 3 %
HCT: 36.7 % (ref 36.0–46.0)
Hemoglobin: 12.3 g/dL (ref 12.0–15.0)
Immature Granulocytes: 1 %
Lymphocytes Relative: 22 %
Lymphs Abs: 2 10*3/uL (ref 0.7–4.0)
MCH: 30.8 pg (ref 26.0–34.0)
MCHC: 33.5 g/dL (ref 30.0–36.0)
MCV: 91.8 fL (ref 80.0–100.0)
Monocytes Absolute: 0.4 10*3/uL (ref 0.1–1.0)
Monocytes Relative: 4 %
Neutro Abs: 6.5 10*3/uL (ref 1.7–7.7)
Neutrophils Relative %: 70 %
Platelets: 310 10*3/uL (ref 150–400)
RBC: 4 MIL/uL (ref 3.87–5.11)
RDW: 13.3 % (ref 11.5–15.5)
WBC: 9.2 10*3/uL (ref 4.0–10.5)
nRBC: 0 % (ref 0.0–0.2)

## 2018-12-13 LAB — TYPE AND SCREEN
ABO/RH(D): B NEG
Antibody Screen: POSITIVE
Unit division: 0
Unit division: 0

## 2018-12-13 NOTE — Progress Notes (Signed)
Patient ID: Whitney Lowe, female   DOB: [birth year], 33 y.o.   MRN: [id number] FACULTY PRACTICE ANTEPARTUM PROGRESS NOTE  Whitney Lowe is a 33 y.o. G3P0020 at 44w3dwho is admitted for PROM.  Estimated Date of Delivery: 02/25/19 Fetal presentation is cephalic.  Length of Stay:  18 Days. Admitted 11/25/2018  Subjective:  Patient reports normal fetal movement.  She denies uterine contractions, reports increase in the amount of leakage of fluid. Denies fever/chills  Vitals:  Blood pressure (!) 92/57, pulse (!) 105, temperature 98.3 F (36.8 C), temperature source Oral, resp. rate 16, height 4' 10"  (1.473 m), weight 56.2 kg, last menstrual period 05/21/2018, SpO2 98 %. Physical Examination: CONSTITUTIONAL: Well-developed, well-nourished female in no acute distress.  CARDIOVASCULAR: Normal heart rate noted RESPIRATORY: Effort normal, no problems with respiration noted MUSCULOSKELETAL: Normal range of motion. No edema and no tenderness. ABDOMEN: Soft, non tender, gravid. CERVIX: deferred  Fetal monitoring: FHR: 150 bpm, Variability: moderate, Accelerations: Present, Decelerations: Absent  Uterine activity: no contractions per hour  Results for orders placed or performed during the hospital encounter of 11/25/18 (from the past 48 hour(s))  CBC with Differential/Platelet     Status: Abnormal   Collection Time: 12/11/18 11:34 AM  Result Value Ref Range   WBC 10.9 (H) 4.0 - 10.5 K/uL   RBC 3.87 3.87 - 5.11 MIL/uL   Hemoglobin 11.7 (L) 12.0 - 15.0 g/dL   HCT 35.3 (L) 36.0 - 46.0 %   MCV 91.2 80.0 - 100.0 fL   MCH 30.2 26.0 - 34.0 pg   MCHC 33.1 30.0 - 36.0 g/dL   RDW 13.7 11.5 - 15.5 %   Platelets 324 150 - 400 K/uL   nRBC 0.0 0.0 - 0.2 %   Neutrophils Relative % 70 %   Neutro Abs 7.6 1.7 - 7.7 K/uL   Lymphocytes Relative 21 %   Lymphs Abs 2.3 0.7 - 4.0 K/uL   Monocytes Relative 6 %   Monocytes Absolute 0.6 0.1 - 1.0 K/uL   Eosinophils Relative 2 %   Eosinophils Absolute 0.2 0.0 - 0.5  K/uL   Basophils Relative 0 %   Basophils Absolute 0.0 0.0 - 0.1 K/uL   Immature Granulocytes 1 %   Abs Immature Granulocytes 0.08 (H) 0.00 - 0.07 K/uL    Comment: Performed at [facility] [address] [place] [address]   I have reviewed the patient's current medications.  ASSESSMENT: Principal Problem:   Preterm premature rupture of membranes (PPROM), second trimester, antepartum Active Problems:   Crohn's ileocolitis (HCC)   Long-term use of immunosuppressant medication-Stelara   Supervision of high risk pregnancy, antepartum   Rubella non-immune status, antepartum   Rh negative, antepartum   Anemia affecting pregnancy, antepartum   Cervical mass   Fibroid uterus   Antepartum placental abruption   Tachycardia   PLAN: [place] completed [place] latency antibiotics Normal glucola last visit Continue monitoring for si/sx of chorioamnionitis Delivery at 34 weeks or prior if choriomanionitis Continue routine antenatal care.   PMora Bellman M.D. Attending Center for [facility] [employer] [facility](Faculty Practice)  12/13/2018 9:58 AM

## 2018-12-13 NOTE — Progress Notes (Signed)
Initial visit with Niomi to introduce spiritual care services and initiate a relationship of care and support.  Pt was in her bed at the time of my visit and her mood was bright.  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  Nakea has good support in her spouse and is able to have her dog visit her daily, which is a tremendous stress relief.  She is eager to meet her son or daughter, but knows the wait is what is healthiest for the baby.  She's hoping to make it to 34 weeks.  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  Pt is aware of continued spiritual support available to her and we discussed finding small things to look forward to, like the possibility of a wheel chair ride outside.  Please page as further needs arise.  Donald Prose. Elyn Peers, M.Div. [facility] Chaplain Pager [phone] Office [phone]

## 2018-12-14 NOTE — Plan of Care (Signed)
  Problem: Coping: Goal: Level of anxiety will decrease Outcome: Completed/Met

## 2018-12-14 NOTE — Progress Notes (Signed)
Patient ID: Whitney Lowe, female   DOB: 09-13-1985, 33 y.o.   MRN: [id number] [place] ANTEPARTUM PROGRESS NOTE  Whitney Lowe is a 33 y.o. G3P0020 at 2w4dwho is admitted for PROM.  Estimated Date of Delivery: 02/25/19 Fetal presentation is cephalic.  Length of Stay:  19 Days. Admitted 11/25/2018  Subjective:  Patient reports normal fetal movement and persistent leakage of fluid.  She denies uterine contractions. Denies fever/chills  Vitals:  Blood pressure (!) 93/57, pulse 100, temperature 98.2 F (36.8 C), temperature source Oral, resp. rate 18, height 4' 10"  (1.473 m), weight 56.2 kg, last menstrual period 05/21/2018, SpO2 99 %. Physical Examination: CONSTITUTIONAL: Well-developed, well-nourished female in no acute distress.  CARDIOVASCULAR: Normal heart rate noted RESPIRATORY: Effort normal, no problems with respiration noted MUSCULOSKELETAL: Normal range of motion. No edema and no tenderness. ABDOMEN: Soft, non tender, gravid. CERVIX: deferred  Fetal monitoring: FHR: 145 bpm, Variability: moderate, Accelerations: Present 10 x 10, Decelerations: Absent  Uterine activity: no contractions per hour  Results for orders placed or performed during the hospital encounter of 11/25/18 (from the past 48 hour(s))  CBC with Differential/Platelet     Status: None   Collection Time: 12/13/18 10:54 AM  Result Value Ref Range   WBC 9.2 4.0 - 10.5 K/uL   RBC 4.00 3.87 - 5.11 MIL/uL   Hemoglobin 12.3 12.0 - 15.0 g/dL   HCT 36.7 36.0 - 46.0 %   MCV 91.8 80.0 - 100.0 fL   MCH 30.8 26.0 - 34.0 pg   MCHC 33.5 30.0 - 36.0 g/dL   RDW 13.3 11.5 - 15.5 %   Platelets 310 150 - 400 K/uL   nRBC 0.0 0.0 - 0.2 %   Neutrophils Relative % 70 %   Neutro Abs 6.5 1.7 - 7.7 K/uL   Lymphocytes Relative 22 %   Lymphs Abs 2.0 0.7 - 4.0 K/uL   Monocytes Relative 4 %   Monocytes Absolute 0.4 0.1 - 1.0 K/uL   Eosinophils Relative 3 %   Eosinophils Absolute 0.3 0.0 - 0.5 K/uL   Basophils Relative 0 %   Basophils Absolute 0.0 0.0 - 0.1 K/uL   Immature Granulocytes 1 %   Abs Immature Granulocytes 0.06 0.00 - 0.07 K/uL    Comment: Performed at [facility] [address], [place] [address] Type and screen     Status: None (Preliminary result)   Collection Time: 12/13/18 10:54 AM  Result Value Ref Range   ABO/RH(D) B NEG    Antibody Screen POS    Sample Expiration 12/16/2018,2359    Antibody Identification PASSIVELY ACQUIRED ANTI-D    Unit Number [id number]   Blood Component Type RED CELLS,LR    Unit division 00    Status of Unit ALLOCATED    Transfusion Status OK TO TRANSFUSE    Crossmatch Result COMPATIBLE    Unit Number [id number]   Blood Component Type RBC LR PHER2    Unit division 00    Status of Unit ALLOCATED    Transfusion Status OK TO TRANSFUSE    Crossmatch Result COMPATIBLE     I have reviewed the patient's current medications.  ASSESSMENT: Principal Problem:   Preterm premature rupture of membranes (PPROM), second trimester, antepartum Active Problems:   Crohn's ileocolitis ([place]   Long-term use of immunosuppressant medication-Stelara   Supervision of high risk pregnancy, antepartum   Rubella non-immune status, antepartum   Rh negative, antepartum   Anemia affecting pregnancy, antepartum  Cervical mass   Fibroid uterus   Antepartum placental abruption   Tachycardia   PLAN: Patient completed [place] Patient completed latency antibiotics Normal glucola last visit Continue monitoring for si/sx of chorioamnionitis Delivery at 34 weeks or prior if choriomanionitis Continue routine antenatal care.   [name], M.D. Attending Center for [employer] (Faculty Practice)  12/14/2018 10:00 AM

## 2018-12-14 NOTE — Plan of Care (Signed)
  Problem: Education: Goal: Knowledge of disease or condition will improve Outcome: Completed/Met   Pt reminded to report abdominal pain which could be indication of infection, vaginal bleeding and any severe headaches or blurry vision which could be a sign of preeclampsia.

## 2018-12-15 NOTE — Plan of Care (Signed)
  Problem: Education: Goal: Individualized Educational Video(s) Outcome: Progressing

## 2018-12-15 NOTE — Progress Notes (Signed)
Patient ID: Whitney Lowe, female   DOB: Feb 14, 1986, 33 y.o.   MRN: [id number] FACULTY PRACTICE ANTEPARTUM PROGRESS NOTE  Cody Oliger is a 33 y.o. G3P0020 at 71w5dwho is admitted for PROM.  Estimated Date of Delivery: 02/25/19 Fetal presentation is cephalic.  Length of Stay:  20 Days. Admitted 11/25/2018  Subjective:  Patient reports normal fetal movement and persistent leakage of fluid.  She denies uterine contractions. Denies fever/chills  Vitals:  Blood pressure (!) 93/54, pulse (!) 104, temperature (!) 97.1 F (36.2 C), temperature source Oral, resp. rate 19, height 4' 10"  (1.473 m), weight 56.2 kg, last menstrual period 05/21/2018, SpO2 98 %. Physical Examination: CONSTITUTIONAL: Well-developed, well-nourished female in no acute distress.  CARDIOVASCULAR: Normal heart rate noted RESPIRATORY: Effort normal, no problems with respiration noted MUSCULOSKELETAL: Normal range of motion. No edema and no tenderness. ABDOMEN: Soft, non tender, gravid. CERVIX: deferred  Fetal monitoring: FHR: 140 bpm, Variability: moderate, Accelerations: Present 10 x 10, Decelerations: Absent  Uterine activity: no contractions per hour  Results for orders placed or performed during the hospital encounter of 11/25/18 (from the past 48 hour(s))  CBC with Differential/Platelet     Status: None   Collection Time: 12/13/18 10:54 AM  Result Value Ref Range   WBC 9.2 4.0 - 10.5 K/uL   RBC 4.00 3.87 - 5.11 MIL/uL   Hemoglobin 12.3 12.0 - 15.0 g/dL   HCT 36.7 36.0 - 46.0 %   MCV 91.8 80.0 - 100.0 fL   MCH 30.8 26.0 - 34.0 pg   MCHC 33.5 30.0 - 36.0 g/dL   RDW 13.3 11.5 - 15.5 %   Platelets 310 150 - 400 K/uL   nRBC 0.0 0.0 - 0.2 %   Neutrophils Relative % 70 %   Neutro Abs 6.5 1.7 - 7.7 K/uL   Lymphocytes Relative 22 %   Lymphs Abs 2.0 0.7 - 4.0 K/uL   Monocytes Relative 4 %   Monocytes Absolute 0.4 0.1 - 1.0 K/uL   Eosinophils Relative 3 %   Eosinophils Absolute 0.3 0.0 - 0.5 K/uL   Basophils Relative  0 %   Basophils Absolute 0.0 0.0 - 0.1 K/uL   Immature Granulocytes 1 %   Abs Immature Granulocytes 0.06 0.00 - 0.07 K/uL    Comment: Performed at [facility] [address] [place] [address] Type and screen     Status: None (Preliminary result)   Collection Time: 12/13/18 10:54 AM  Result Value Ref Range   ABO/RH(D) B NEG    Antibody Screen POS    Sample Expiration 12/16/2018,2359    Antibody Identification PASSIVELY ACQUIRED ANTI-D    Unit Number [id number]   Blood Component Type RED CELLS,LR    Unit division 00    Status of Unit ALLOCATED    Transfusion Status OK TO TRANSFUSE    Crossmatch Result COMPATIBLE    Unit Number [id number]   Blood Component Type RBC LR PHER2    Unit division 00    Status of Unit ALLOCATED    Transfusion Status OK TO TRANSFUSE    Crossmatch Result COMPATIBLE     I have reviewed the patient's current medications.  ASSESSMENT: Principal Problem:   Preterm premature rupture of membranes (PPROM), second trimester, antepartum Active Problems:   Crohn's ileocolitis ([place]   Long-term use of immunosuppressant medication-Stelara   Supervision of high risk pregnancy, antepartum   Rubella non-immune status, antepartum   Rh negative, antepartum   Anemia affecting pregnancy,  antepartum   Cervical mass   Fibroid uterus   Antepartum placental abruption   Tachycardia   PLAN: Patient completed [place] Patient completed latency antibiotics Normal glucola last visit Continue monitoring for si/sx of chorioamnionitis Delivery at 34 weeks or prior if choriomanionitis Continue routine antenatal care.   [name], M.D. Attending Center for [employer] (Faculty Practice)  12/15/2018 10:19 AM

## 2018-12-16 LAB — CBC WITH DIFFERENTIAL/PLATELET
Abs Immature Granulocytes: 0.06 10*3/uL (ref 0.00–0.07)
Basophils Absolute: 0 10*3/uL (ref 0.0–0.1)
Basophils Relative: 0 %
Eosinophils Absolute: 0.3 10*3/uL (ref 0.0–0.5)
Eosinophils Relative: 4 %
HCT: 33.5 % — ABNORMAL LOW (ref 36.0–46.0)
Hemoglobin: 11.1 g/dL — ABNORMAL LOW (ref 12.0–15.0)
Immature Granulocytes: 1 %
Lymphocytes Relative: 26 %
Lymphs Abs: 2.1 10*3/uL (ref 0.7–4.0)
MCH: 30 pg (ref 26.0–34.0)
MCHC: 33.1 g/dL (ref 30.0–36.0)
MCV: 90.5 fL (ref 80.0–100.0)
Monocytes Absolute: 0.4 10*3/uL (ref 0.1–1.0)
Monocytes Relative: 5 %
Neutro Abs: 5.1 10*3/uL (ref 1.7–7.7)
Neutrophils Relative %: 64 %
Platelets: 293 10*3/uL (ref 150–400)
RBC: 3.7 MIL/uL — ABNORMAL LOW (ref 3.87–5.11)
RDW: 13.3 % (ref 11.5–15.5)
WBC: 8 10*3/uL (ref 4.0–10.5)
nRBC: 0 % (ref 0.0–0.2)

## 2018-12-16 LAB — TYPE AND SCREEN
ABO/RH(D): B NEG
Antibody Screen: POSITIVE
Unit division: 0
Unit division: 0

## 2018-12-16 LAB — BPAM RBC
Blood Product Expiration Date: 202009182359
Blood Product Expiration Date: 202009192359
Unit Type and Rh: 1700
Unit Type and Rh: 1700

## 2018-12-16 NOTE — Progress Notes (Signed)
Patient ID: Whitney Lowe, female   DOB: [birth year], 33 y.o.   MRN: [id number] FACULTY PRACTICE ANTEPARTUM PROGRESS NOTE  Whitney Lowe is a 33 y.o. G3P0020 at 42w6dwho is admitted for PROM.  Estimated Date of Delivery: 02/25/19 Fetal presentation is cephalic.  Length of Stay:  21 Days. Admitted 11/25/2018  Subjective: Patient reports normal fetal movement and persistent leakage of fluid.  She denies uterine contractions. Denies fever/chills  Vitals:  Blood pressure (!) 87/53, pulse (!) 103, temperature 98.7 F (37.1 C), temperature source Oral, resp. rate 18, height 4' 10"  (1.473 m), weight 56.2 kg, last menstrual period 05/21/2018, SpO2 99 %. Physical Examination: CONSTITUTIONAL: Well-developed, well-nourished female in no acute distress.  CARDIOVASCULAR: Normal heart rate noted RESPIRATORY: Effort normal, no problems with respiration noted ABDOMEN: Soft, non tender, gravid. CERVIX: deferred  Fetal monitoring: FHR: 140 bpm, Variability: moderate, Accelerations: Present 15 x 15, Decelerations: Absent  Uterine activity: no contractions per hour  No results found for this or any previous visit (from the past 48 hour(s)).  I have reviewed the patient's current medications.  ASSESSMENT: Principal Problem:   Preterm premature rupture of membranes (PPROM), second trimester, antepartum Active Problems:   Crohn's ileocolitis (HCC)   Long-term use of immunosuppressant medication-Stelara   Supervision of high risk pregnancy, antepartum   Rubella non-immune status, antepartum   Rh negative, antepartum   Anemia affecting pregnancy, antepartum   Cervical mass   Fibroid uterus   Antepartum placental abruption   Tachycardia   PLAN: Maternal/fetal status hemodynamically stable Continue monitoring for si/sx of chorioamnionitis Delivery at 34 weeks or prior if choriomanionitis Continue routine antenatal care.   PMora Bellman M.D. Attending Center for [facility] [employer](Faculty  Practice)  12/16/2018 9:50 AM

## 2018-12-17 DIAGNOSIS — Z3A30 30 weeks gestation of pregnancy: Secondary | ICD-10-CM

## 2018-12-17 NOTE — Progress Notes (Signed)
Patient ID: Marriah Sanderlin, female   DOB: 04-02-1986, 33 y.o.   MRN: [id number] [place] ANTEPARTUM PROGRESS NOTE  Zamia Tyminski is a 33 y.o. G3P0020 at 51w0dwho is admitted for PROM.  Estimated Date of Delivery: 02/25/19 Fetal presentation is cephalic.  Length of Stay:  22 Days. Admitted 11/25/2018  Subjective: Patient reports normal fetal movement and persistent leakage of fluid.  She denies uterine contractions. Denies fever/chills  Vitals:  Blood pressure 91/60, pulse (!) 106, temperature 98 F (36.7 C), temperature source Oral, resp. rate 18, height 4' 10"  (1.473 m), weight 56.2 kg, last menstrual period 05/21/2018, SpO2 100 %. Physical Examination: CONSTITUTIONAL: Well-developed, well-nourished female in no acute distress.  CARDIOVASCULAR: Normal heart rate noted RESPIRATORY: Effort normal, no problems with respiration noted ABDOMEN: Soft, non tender, gravid. CERVIX: deferred  Fetal monitoring: FHR: 140 bpm, Variability: moderate, Accelerations: Present 15 x 15, Decelerations: Absent  Uterine activity: no contractions per hour  Results for orders placed or performed during the hospital encounter of 11/25/18 (from the past 48 hour(s))  CBC with Differential/Platelet     Status: Abnormal   Collection Time: 12/16/18 10:59 AM  Result Value Ref Range   WBC 8.0 4.0 - 10.5 K/uL   RBC 3.70 (L) 3.87 - 5.11 MIL/uL   Hemoglobin 11.1 (L) 12.0 - 15.0 g/dL   HCT 33.5 (L) 36.0 - 46.0 %   MCV 90.5 80.0 - 100.0 fL   MCH 30.0 26.0 - 34.0 pg   MCHC 33.1 30.0 - 36.0 g/dL   RDW 13.3 11.5 - 15.5 %   Platelets 293 150 - 400 K/uL   nRBC 0.0 0.0 - 0.2 %   Neutrophils Relative % 64 %   Neutro Abs 5.1 1.7 - 7.7 K/uL   Lymphocytes Relative 26 %   Lymphs Abs 2.1 0.7 - 4.0 K/uL   Monocytes Relative 5 %   Monocytes Absolute 0.4 0.1 - 1.0 K/uL   Eosinophils Relative 4 %   Eosinophils Absolute 0.3 0.0 - 0.5 K/uL   Basophils Relative 0 %   Basophils Absolute 0.0 0.0 - 0.1 K/uL   Immature  Granulocytes 1 %   Abs Immature Granulocytes 0.06 0.00 - 0.07 K/uL    Comment: Performed at [facility] [address] [place] [address] Type and screen     Status: None (Preliminary result)   Collection Time: 12/16/18 11:00 AM  Result Value Ref Range   ABO/RH(D) B NEG    Antibody Screen POS    Sample Expiration 12/19/2018,2359    Antibody Identification      PASSIVELY ACQUIRED ANTI-D Performed at [facility] [address], [place] [address]   Unit Number [id number]   Blood Component Type RBC LR PHER2    Unit division 00    Status of Unit ALLOCATED    Transfusion Status OK TO TRANSFUSE    Crossmatch Result COMPATIBLE    Unit Number [id number]   Blood Component Type RED CELLS,LR    Unit division 00    Status of Unit ALLOCATED    Transfusion Status OK TO TRANSFUSE    Crossmatch Result COMPATIBLE     I have reviewed the patient's current medications.  ASSESSMENT: Principal Problem:   Preterm premature rupture of membranes (PPROM), second trimester, antepartum Active Problems:   Crohn's ileocolitis ([place]   Long-term use of immunosuppressant medication-Stelara   Supervision of high risk pregnancy, antepartum   Rubella non-immune status, antepartum  Rh negative, antepartum   Anemia affecting pregnancy, antepartum   Cervical mass   Fibroid uterus   Antepartum placental abruption   Tachycardia   PLAN: Maternal/fetal status hemodynamically stable Continue monitoring for si/sx of chorioamnionitis Delivery at 34 weeks or prior if choriomanionitis Patient interested in speaking with NICU next Friday for update on fetal outcome Continue routine antenatal care.   [name], M.D. Attending Center for [employer] (Faculty Practice)  12/17/2018 9:03 AM

## 2018-12-18 ENCOUNTER — Encounter (HOSPITAL_COMMUNITY): Payer: Self-pay | Admitting: Nurse Practitioner

## 2018-12-18 NOTE — Progress Notes (Signed)
FACULTY PRACTICE ANTEPARTUM PROGRESS NOTE  Whitney Lowe is a 33 y.o. G3P0020 at 55w1dwho is admitted for PROM.  Estimated Date of Delivery: 02/25/19 Fetal presentation is cephalic.  Length of Stay:  23 Days. Admitted 11/25/2018  Subjective:  Patient reports normal fetal movement.  She denies uterine contractions, denies bleeding and leaking of fluid per vagina.  Vitals:  Blood pressure (!) 100/58, pulse 97, temperature 98.4 F (36.9 C), temperature source Oral, resp. rate 17, height 4' 10"  (1.473 m), weight 56.2 kg, last menstrual period 05/21/2018, SpO2 99 %. Physical Examination: CONSTITUTIONAL: Well-developed, well-nourished female in no acute distress.  HENT:  Normocephalic, atraumatic, External right and left ear normal. Oropharynx is clear and moist EYES: Conjunctivae and EOM are normal. Pupils are equal, round, and reactive to light. No scleral icterus.  NECK: Normal range of motion, supple, no masses. SKIN: Skin is warm and dry. No rash noted. Not diaphoretic. No erythema. No pallor. [place] Alert and oriented to person, place, and time. Normal reflexes, muscle tone coordination. No cranial nerve deficit noted. PSYCHIATRIC: Normal mood and affect. Normal behavior. Normal judgment and thought content. CARDIOVASCULAR: Normal heart rate noted RESPIRATORY: Effort normal, no problems with respiration noted MUSCULOSKELETAL: Normal range of motion. No edema and no tenderness. ABDOMEN: Soft, nontender, nondistended, gravid. CERVIX: deferred  Fetal monitoring: FHR: 135 bpm, Variability: moderate, Accelerations: Present, Decelerations: Absent  Uterine activity: no contractions per hour  Results for orders placed or performed during the hospital encounter of 11/25/18 (from the past 48 hour(s))  CBC with Differential/Platelet     Status: Abnormal   Collection Time: 12/16/18 10:59 AM  Result Value Ref Range   WBC 8.0 4.0 - 10.5 K/uL   RBC 3.70 (L) 3.87 - 5.11 MIL/uL   Hemoglobin 11.1  (L) 12.0 - 15.0 g/dL   HCT 33.5 (L) 36.0 - 46.0 %   MCV 90.5 80.0 - 100.0 fL   MCH 30.0 26.0 - 34.0 pg   MCHC 33.1 30.0 - 36.0 g/dL   RDW 13.3 11.5 - 15.5 %   Platelets 293 150 - 400 K/uL   nRBC 0.0 0.0 - 0.2 %   Neutrophils Relative % 64 %   Neutro Abs 5.1 1.7 - 7.7 K/uL   Lymphocytes Relative 26 %   Lymphs Abs 2.1 0.7 - 4.0 K/uL   Monocytes Relative 5 %   Monocytes Absolute 0.4 0.1 - 1.0 K/uL   Eosinophils Relative 4 %   Eosinophils Absolute 0.3 0.0 - 0.5 K/uL   Basophils Relative 0 %   Basophils Absolute 0.0 0.0 - 0.1 K/uL   Immature Granulocytes 1 %   Abs Immature Granulocytes 0.06 0.00 - 0.07 K/uL    Comment: [facility]formed at [facility] [address], [place] [address] Type and screen     Status: None (Preliminary result)   Collection Time: 12/16/18 11:00 AM  Result Value Ref Range   ABO/RH(D) B NEG    Antibody Screen POS    Sample Expiration 12/19/2018,2359    Antibody Identification      PASSIVELY ACQUIRED ANTI-D Performed at [facility] [address], [place] [address]   Unit Number [id number]   Blood Component Type RBC LR PHER2    Unit division 00    Status of Unit ALLOCATED    Transfusion Status OK TO TRANSFUSE    Crossmatch Result COMPATIBLE    Unit Number [id number]   Blood Component Type RED CELLS,LR  Unit division 00    Status of Unit ALLOCATED    Transfusion Status OK TO TRANSFUSE    Crossmatch Result COMPATIBLE     I have reviewed the patient's current medications.  ASSESSMENT: Principal Problem:   Preterm premature rupture of membranes (PPROM), second trimester, antepartum Active Problems:   Crohn's ileocolitis (HCC)   Long-term use of immunosuppressant medication-Stelara   Supervision of high risk pregnancy, antepartum   Rubella non-immune status, antepartum   Rh negative, antepartum   Anemia affecting pregnancy, antepartum   Cervical mass   Fibroid uterus   Antepartum placental abruption    Tachycardia   PLAN: stable S/p BTMZ S/p latency antibiotics Delivery at 34 weeks or sooner if chorioamnionitics Continue routine antenatal care.   Feliz Beam, M.D. Attending Center for [employer] (Faculty Practice)  12/18/2018 7:57 AM

## 2018-12-19 LAB — TYPE AND SCREEN
ABO/RH(D): B NEG
Antibody Screen: POSITIVE
Unit division: 0
Unit division: 0

## 2018-12-19 LAB — CBC WITH DIFFERENTIAL/PLATELET
Abs Immature Granulocytes: 0.07 10*3/uL (ref 0.00–0.07)
Basophils Absolute: 0 10*3/uL (ref 0.0–0.1)
Basophils Relative: 0 %
Eosinophils Absolute: 0.2 10*3/uL (ref 0.0–0.5)
Eosinophils Relative: 2 %
HCT: 34 % — ABNORMAL LOW (ref 36.0–46.0)
Hemoglobin: 11.3 g/dL — ABNORMAL LOW (ref 12.0–15.0)
Immature Granulocytes: 1 %
Lymphocytes Relative: 24 %
Lymphs Abs: 2.3 10*3/uL (ref 0.7–4.0)
MCH: 29.8 pg (ref 26.0–34.0)
MCHC: 33.2 g/dL (ref 30.0–36.0)
MCV: 89.7 fL (ref 80.0–100.0)
Monocytes Absolute: 0.3 10*3/uL (ref 0.1–1.0)
Monocytes Relative: 4 %
Neutro Abs: 6.8 10*3/uL (ref 1.7–7.7)
Neutrophils Relative %: 69 %
Platelets: 268 10*3/uL (ref 150–400)
RBC: 3.79 MIL/uL — ABNORMAL LOW (ref 3.87–5.11)
RDW: 13.6 % (ref 11.5–15.5)
WBC: 9.7 10*3/uL (ref 4.0–10.5)
nRBC: 0 % (ref 0.0–0.2)

## 2018-12-19 LAB — BPAM RBC
Blood Product Expiration Date: 202009182359
Blood Product Expiration Date: 202009192359
Unit Type and Rh: 1700
Unit Type and Rh: 1700

## 2018-12-19 NOTE — Progress Notes (Signed)
Patient ID: Whitney Lowe, female   DOB: [birth year], 33 y.o.   MRN: [id number] FACULTY PRACTICE ANTEPARTUM PROGRESS NOTE  Whitney Lowe is a 33 y.o. G3P0020 at 14w2dwho is admitted for PROM.   Fetal presentation is cephalic.  Length of Stay:  24 Days. Admitted 11/25/2018  Subjective: Patient is without complaints Patient reports normal fetal movement.  She denies uterine contractions, denies bleeding and leaking of fluid per vagina.  Vitals:  Blood pressure 101/61, pulse (!) 114, temperature 98.1 F (36.7 C), temperature source Oral, resp. rate 17, height 4' 10"  (1.473 m), weight 56.2 kg, last menstrual period 05/21/2018, SpO2 100 %. Physical Examination: CONSTITUTIONAL: Well-developed, well-nourished female in no acute distress.  CARDIOVASCULAR: Normal heart rate noted RESPIRATORY: Effort normal, no problems with respiration noted ABDOMEN: Soft, nontender, nondistended, gravid. CERVIX: deferred  Fetal monitoring: FHR: 145 bpm, Variability: moderate, Accelerations: Present, Decelerations: Absent  Uterine activity: no contractions per hour  No results found for this or any previous visit (from the past 48 hour(s)).  I have reviewed the patient's current medications.  ASSESSMENT: Principal Problem:   Preterm premature rupture of membranes (PPROM), second trimester, antepartum Active Problems:   Crohn's ileocolitis (HCC)   Long-term use of immunosuppressant medication-Stelara   Supervision of high risk pregnancy, antepartum   Rubella non-immune status, antepartum   Rh negative, antepartum   Anemia affecting pregnancy, antepartum   Cervical mass   Fibroid uterus   Antepartum placental abruption   Tachycardia   PLAN: Maternal/fetal status stable S/p BTMZ S/p latency antibiotics Delivery at 34 weeks or sooner if chorioamnionitics Continue routine antenatal care.   PMora Bellman M.D. Attending [facility])  12/19/2018 7:38 AM

## 2018-12-20 MED ORDER — HYDROCORT-PRAMOXINE (PERIANAL) 1-1 % EX FOAM
1.0000 | Freq: Two times a day (BID) | CUTANEOUS | Status: DC | PRN
  Filled 2018-12-20: qty 10

## 2018-12-20 NOTE — Progress Notes (Signed)
Patient ID: Whitney Lowe, female   DOB: 1985-06-01, 33 y.o.   MRN: [id number] [place]) NOTE  Whitney Lowe is a 33 y.o. G3P0020 at 12w3dby best clinical estimate who is admitted for PROM.   Fetal presentation is cephalic. Length of Stay:  25  Days  Subjective: Continues to leak fluid. Bed is uncomfortable. No colitis issues. Patient reports the fetal movement as active. Patient reports uterine contraction  activity as none. Patient reports  vaginal bleeding as none. Patient describes fluid per vagina as Clear.  Vitals:  Blood pressure 99/60, pulse 99, temperature 98.5 F (36.9 C), temperature source Oral, resp. rate 16, height 4' 10"  (1.473 m), weight 56.2 kg, last menstrual period 05/21/2018, SpO2 100 %. Physical Examination:  General appearance - alert, well appearing, and in no distress Chest - normal effort Abdomen - gravid, non-tender Fundal Height:  size equals dates Extremities: Homans sign is negative, no sign of DVT  Membranes:ruptured, clear fluid  Fetal Monitoring:  Baseline: 135 bpm, Variability: Good {> 6 bpm), Accelerations: Reactive and Decelerations: Absent  Labs:  Results for orders placed or performed during the hospital encounter of 11/25/18 (from the past 24 hour(s))  CBC with Differential/Platelet   Collection Time: 12/19/18 11:31 AM  Result Value Ref Range   WBC 9.7 4.0 - 10.5 K/uL   RBC 3.79 (L) 3.87 - 5.11 MIL/uL   Hemoglobin 11.3 (L) 12.0 - 15.0 g/dL   HCT 34.0 (L) 36.0 - 46.0 %   MCV 89.7 80.0 - 100.0 fL   MCH 29.8 26.0 - 34.0 pg   MCHC 33.2 30.0 - 36.0 g/dL   RDW 13.6 11.5 - 15.5 %   Platelets 268 150 - 400 K/uL   nRBC 0.0 0.0 - 0.2 %   Neutrophils Relative % 69 %   Neutro Abs 6.8 1.7 - 7.7 K/uL   Lymphocytes Relative 24 %   Lymphs Abs 2.3 0.7 - 4.0 K/uL   Monocytes Relative 4 %   Monocytes Absolute 0.3 0.1 - 1.0 K/uL   Eosinophils Relative 2 %   Eosinophils Absolute 0.2 0.0 - 0.5 K/uL   Basophils Relative 0 %    Basophils Absolute 0.0 0.0 - 0.1 K/uL   Immature Granulocytes 1 %   Abs Immature Granulocytes 0.07 0.00 - 0.07 K/uL  Type and screen   Collection Time: 12/19/18 11:31 AM  Result Value Ref Range   ABO/RH(D) B NEG    Antibody Screen POS    Sample Expiration 12/22/2018,2359    Antibody Identification      PASSIVELY ACQUIRED ANTI-D [facility]formed at [facility] [address] [place] [address]   Unit Number [id number]   Blood Component Type RBC LR PHER2    Unit division 00    Status of Unit ALLOCATED    Transfusion Status OK TO TRANSFUSE    Crossmatch Result COMPATIBLE    Unit Number [id number]   Blood Component Type RED CELLS,LR    Unit division 00    Status of Unit ALLOCATED    Transfusion Status OK TO TRANSFUSE    Crossmatch Result COMPATIBLE   BPAM RBC   Collection Time: 12/19/18 11:31 AM  Result Value Ref Range   Blood Product Unit Number [id number]   PRODUCT CODE EK0254Y70   Unit Type and Rh 1700    Blood Product Expiration Date [id number]   Blood Product Unit Number [id number]   PRODUCT CODE ES8546E70   Unit  Type and Rh 1700    Blood Product Expiration Date 550016429037     Medications:  Scheduled . senna-docusate  2 tablet Oral BID  . sodium chloride flush  3 mL Intravenous Q12H   I have reviewed the patient's current medications.  ASSESSMENT: Principal Problem:   Preterm premature rupture of membranes (PPROM), second trimester, antepartum Active Problems:   Crohn's ileocolitis (HCC)   Long-term use of immunosuppressant medication-Stelara   Supervision of high risk pregnancy, antepartum   Rubella non-immune status, antepartum   Rh negative, antepartum   Anemia affecting pregnancy, antepartum   Cervical mass   Fibroid uterus   Antepartum placental abruption   Tachycardia   PLAN: S/p BMZ S/p latency abx Continue Stelara--next dose due end of September Nml Glucola Delivery with worsening maternal/fetal status or  s/sx's of infection  Donnamae Jude, MD 12/20/2018,9:47 AM

## 2018-12-21 NOTE — Progress Notes (Signed)
Patient ID: Whitney Lowe, female   DOB: [birth year], 33 y.o.   MRN: [id number] [place]) NOTE  Whitney Lowe is a 33 y.o. G3P0020 at 9w4dby best clinical estimate who is admitted for PROM.   Fetal presentation is cephalic. Length of Stay:  26  Days  Subjective: Feels well. No complaints. Patient reports the fetal movement as active. Patient reports uterine contraction  activity as none. Patient reports  vaginal bleeding as none. Patient describes fluid per vagina as Clear.  Vitals:  Blood pressure (!) 91/53, pulse (!) 102, temperature 98.1 F (36.7 C), temperature source Oral, resp. rate 18, height 4' 10"  (1.473 m), weight 56.2 kg, last menstrual period 05/21/2018, SpO2 99 %. Physical Examination:  General appearance - alert, well appearing, and in no distress Chest - normal effort Abdomen - gravid, non-tender Fundal Height:  size equals dates Extremities: Homans sign is negative, no sign of DVT  Membranes:ruptured, clear fluid  Fetal Monitoring:  Baseline: 145 bpm, Variability: Good {> 6 bpm), Accelerations: Reactive and Decelerations: Absent  Labs:  No results found for this or any previous visit (from the past 24 hour(s)).   Medications:  Scheduled . senna-docusate  2 tablet Oral BID  . sodium chloride flush  3 mL Intravenous Q12H   I have reviewed the patient's current medications.  ASSESSMENT: Principal Problem:   Preterm premature rupture of membranes (PPROM), second trimester, antepartum Active Problems:   Crohn's ileocolitis (HCC)   Long-term use of immunosuppressant medication-Stelara   Supervision of high risk pregnancy, antepartum   Rubella non-immune status, antepartum   Rh negative, antepartum   Anemia affecting pregnancy, antepartum   Cervical mass   Fibroid uterus   Antepartum placental abruption   Tachycardia   PLAN: Stable Delivery with s/sx's chorioamnionitis  TDonnamae Jude MD 12/21/2018,11:12 AM

## 2018-12-22 LAB — CBC WITH DIFFERENTIAL/PLATELET
Abs Immature Granulocytes: 0.08 10*3/uL — ABNORMAL HIGH (ref 0.00–0.07)
Basophils Absolute: 0 10*3/uL (ref 0.0–0.1)
Basophils Relative: 0 %
Eosinophils Absolute: 0.3 10*3/uL (ref 0.0–0.5)
Eosinophils Relative: 3 %
HCT: 34 % — ABNORMAL LOW (ref 36.0–46.0)
Hemoglobin: 11.3 g/dL — ABNORMAL LOW (ref 12.0–15.0)
Immature Granulocytes: 1 %
Lymphocytes Relative: 28 %
Lymphs Abs: 2.2 10*3/uL (ref 0.7–4.0)
MCH: 30 pg (ref 26.0–34.0)
MCHC: 33.2 g/dL (ref 30.0–36.0)
MCV: 90.2 fL (ref 80.0–100.0)
Monocytes Absolute: 0.6 10*3/uL (ref 0.1–1.0)
Monocytes Relative: 8 %
Neutro Abs: 4.7 10*3/uL (ref 1.7–7.7)
Neutrophils Relative %: 60 %
Platelets: 275 10*3/uL (ref 150–400)
RBC: 3.77 MIL/uL — ABNORMAL LOW (ref 3.87–5.11)
RDW: 13.7 % (ref 11.5–15.5)
WBC: 7.8 10*3/uL (ref 4.0–10.5)
nRBC: 0 % (ref 0.0–0.2)

## 2018-12-22 LAB — TYPE AND SCREEN
ABO/RH(D): B NEG
Antibody Screen: POSITIVE
Unit division: 0
Unit division: 0

## 2018-12-22 LAB — BPAM RBC
Blood Product Expiration Date: 202009182359
Blood Product Expiration Date: 202009192359
Unit Type and Rh: 1700
Unit Type and Rh: 1700

## 2018-12-22 NOTE — Progress Notes (Signed)
Patient ID: Whitney Lowe, female   DOB: [birth year], 33 y.o.   MRN: [id number] [name] is a 32 y.o. G3P0020 at 85w5dby best clinical estimate who is admitted for PROM.   Fetal presentation is cephalic. Length of Stay:  27  Days  Subjective: Feels well, no complaints. Patient reports the fetal movement as active. Patient reports uterine contraction  activity as none. Patient reports  vaginal bleeding as none. Patient describes fluid per vagina as None.  Vitals:  Blood pressure (!) 87/58, pulse 100, temperature 98.7 F (37.1 C), temperature source Oral, resp. rate 18, height 4' 10"  (1.473 m), weight 56.2 kg, last menstrual period 05/21/2018, SpO2 98 %. Physical Examination:  General appearance - alert, well appearing, and in no distress Chest - normal effort Abdomen - gravid, non-tender Fundal Height:  size equals dates Extremities: Homans sign is negative, no sign of DVT  Membranes:ruptured, clear fluid  Fetal Monitoring:  Baseline: 140 bpm, Variability: Good {> 6 bpm), Accelerations: Reactive and Decelerations: Absent    Medications:  Scheduled . senna-docusate  2 tablet Oral BID  . sodium chloride flush  3 mL Intravenous Q12H   I have reviewed the patient's current medications.  ASSESSMENT: Principal Problem:   Preterm premature rupture of membranes (PPROM), second trimester, antepartum Active Problems:   Crohn's ileocolitis (HCC)   Long-term use of immunosuppressant medication-Stelara   Supervision of high risk pregnancy, antepartum   Rubella non-immune status, antepartum   Rh negative, antepartum   Anemia affecting pregnancy, antepartum   Cervical mass   Fibroid uterus   Antepartum placental abruption   Tachycardia  PLAN: Stable PPROM No signs of infection Delivery with worsening maternal/fetal status. Crohn's disease - stable.  TDonnamae Jude MD 12/22/2018,11:10 AM

## 2018-12-23 NOTE — Progress Notes (Signed)
Dr Kennon Rounds notified of new pink tinged fluid.  Will continued to monitor.

## 2018-12-23 NOTE — Progress Notes (Signed)
Patient ID: Whitney Lowe, female   DOB: [birth year], 33 y.o.   MRN: [id number] [place]) NOTE  Whitney Lowe is a 33 y.o. G3P0020 with Estimated Date of Delivery: 02/25/19   By   [redacted]w[redacted]d who is admitted for rupture of membranes.    Fetal presentation is cephalic. Length of Stay:  28  Days  Date of admission:11/25/2018  Subjective: Doing fine no complaints except her chihuahua can't stay as much Patient reports the fetal movement as active. Patient reports uterine contraction  activity as none. Patient reports  vaginal bleeding as none. Patient describes fluid per vagina as Clear.  Vitals:  Blood pressure (!) 92/57, pulse 97, temperature 98.1 F (36.7 C), temperature source Oral, resp. rate 16, height 4' 10"  (1.473 m), weight 56.2 kg, last menstrual period 05/21/2018, SpO2 100 %. Vitals:   12/22/18 1222 12/22/18 1550 12/22/18 2010 12/22/18 2238  BP: (!) 99/59 (!) 100/44 (!) 99/41 (!) 92/57  Pulse: 93 94 (!) 116 97  Resp: 18 18 18 16   Temp: 98.5 F (36.9 C) 98.3 F (36.8 C) 97.6 F (36.4 C) 98.1 F (36.7 C)  TempSrc: Oral Oral Oral Oral  SpO2: 99% 99% 100% 100%  Weight:      Height:       Physical Examination:  General appearance - alert, well appearing, and in no distress Abdomen - soft, nontender, nondistended, no masses or organomegaly Fundal Height:  size equals dates Pelvic Exam:  examination not indicated Cervical Exam: Not evaluated.  Extremities: extremities normal, atraumatic, no cyanosis or edema with DTRs 2+ bilaterally Membranes:ruptured, clear fluid  Fetal Monitoring:  Baseline: 140 bpm, Variability: Good {> 6 bpm), Accelerations: Reactive and Decelerations: Absent   reactive  Labs:  Results for orders placed or performed during the hospital encounter of 11/25/18 (from the past 24 hour(s))  Type and screen   Collection Time: 12/22/18 11:54 AM  Result Value Ref Range   ABO/RH(D) B NEG    Antibody Screen POS    Sample Expiration  12/25/2018,2359    Antibody Identification PASSIVELY ACQUIRED ANTI-D    Unit Number [id number]   Blood Component Type RBC LR PHER2    Unit division 00    Status of Unit ALLOCATED    Transfusion Status OK TO TRANSFUSE    Crossmatch Result COMPATIBLE    Unit Number [id number]   Blood Component Type RED CELLS,LR    Unit division 00    Status of Unit ALLOCATED    Transfusion Status OK TO TRANSFUSE    Crossmatch Result COMPATIBLE   BPAM RBC   Collection Time: 12/22/18 11:54 AM  Result Value Ref Range   Blood Product Unit Number [id number]   PRODUCT CODE [id number]   Unit Type and Rh 1700    Blood Product Expiration Date 2573220254270   Blood Product Unit Number [id number]   PRODUCT CODE E0336V00    Unit Type and Rh 1700    Blood Product Expiration Date [id number]  CBC with Differential/Platelet   Collection Time: 12/22/18 11:58 AM  Result Value Ref Range   WBC 7.8 4.0 - 10.5 K/uL   RBC 3.77 (L) 3.87 - 5.11 MIL/uL   Hemoglobin 11.3 (L) 12.0 - 15.0 g/dL   HCT 34.0 (L) 36.0 - 46.0 %   MCV 90.2 80.0 - 100.0 fL   MCH 30.0 26.0 - 34.0 pg   MCHC 33.2 30.0 - 36.0 g/dL   RDW 13.7 11.5 - 15.5 %  Platelets 275 150 - 400 K/uL   nRBC 0.0 0.0 - 0.2 %   Neutrophils Relative % 60 %   Neutro Abs 4.7 1.7 - 7.7 K/uL   Lymphocytes Relative 28 %   Lymphs Abs 2.2 0.7 - 4.0 K/uL   Monocytes Relative 8 %   Monocytes Absolute 0.6 0.1 - 1.0 K/uL   Eosinophils Relative 3 %   Eosinophils Absolute 0.3 0.0 - 0.5 K/uL   Basophils Relative 0 %   Basophils Absolute 0.0 0.0 - 0.1 K/uL   Immature Granulocytes 1 %   Abs Immature Granulocytes 0.08 (H) 0.00 - 0.07 K/uL    Imaging Studies:    No new imaging studies  Medications:  Scheduled . senna-docusate  2 tablet Oral BID  . sodium chloride flush  3 mL Intravenous Q12H   I have reviewed the patient's current medications.  ASSESSMENT: [id number] 82w6dEstimated Date of Delivery: 02/25/19  Patient Active Problem List   Diagnosis  Date Noted  . Tachycardia 12/01/2018  . Preterm premature rupture of membranes (PPROM), second trimester, antepartum 11/25/2018  . Antepartum placental abruption 10/05/2018  . Cervical mass 10/04/2018  . Fibroid uterus 10/04/2018  . Anemia affecting pregnancy, antepartum 09/06/2018  . Rubella non-immune status, antepartum 09/03/2018  . Rh negative, antepartum 09/03/2018  . Supervision of high risk pregnancy, antepartum 08/17/2018  . Long-term use of immunosuppressant medication-Stelara 07/21/2018  . [place] 06/19/[place]    PLAN: >continue in house observation for S/S labor/infection or deliver at 34 weeks if undelivered >s/p betamethasone >Crohn;s is quiet  Casin Federici H Keisuke Hollabaugh 12/23/2018,7:37 AM

## 2018-12-24 NOTE — Progress Notes (Signed)
Patient ID: Whitney Lowe, female   DOB: 08/20/1985, 33 y.o.   MRN: [id number] [place]) NOTE  Whitney Lowe is a 33 y.o. G3P0020 at 28w0dby best clinical estimate who is admitted for PPROM.   Fetal presentation is cephalic. Length of Stay:  29  Days  Subjective: Feels well. No more red-tinged fluid Patient reports the fetal movement as active. Patient reports uterine contraction  activity as none. Patient reports  vaginal bleeding as none. Patient describes fluid per vagina as Clear.  Vitals:  Blood pressure 102/62, pulse (!) 116, temperature 98.7 F (37.1 C), temperature source Oral, resp. rate 18, height 4' 10"  (1.473 m), weight 56.2 kg, last menstrual period 05/21/2018, SpO2 100 %. Physical Examination:  General appearance - alert, well appearing, and in no distress Chest - normal effort Abdomen - gravid, non-tender Fundal Height:  size equals dates Extremities: Homans sign is negative, no sign of DVT  Membranes:ruptured, clear fluid  Fetal Monitoring:  Baseline: 135 bpm, Variability: Good {> 6 bpm), Accelerations: Reactive and Decelerations: Absent  Medications:  Scheduled . senna-docusate  2 tablet Oral BID   I have reviewed the patient's current medications.  ASSESSMENT: Principal Problem:   Preterm premature rupture of membranes (PPROM), second trimester, antepartum Active Problems:   Crohn's ileocolitis (HCC)   Long-term use of immunosuppressant medication-Stelara   Supervision of high risk pregnancy, antepartum   Rubella non-immune status, antepartum   Rh negative, antepartum   Anemia affecting pregnancy, antepartum   Cervical mass   Fibroid uterus   Antepartum placental abruption   Tachycardia   PLAN: Stable PPROM No signs of infection Delivery with worsening maternal/fetal unit.  TDonnamae Jude MD 12/24/2018,12:40 PM

## 2018-12-24 NOTE — Plan of Care (Signed)
  Problem: Elimination: Goal: Will not experience complications related to urinary retention Outcome: Completed/Met   Problem: Pain Managment: Goal: General experience of comfort will improve Outcome: Completed/Met   Problem: Safety: Goal: Ability to remain free from injury will improve Outcome: Completed/Met   

## 2018-12-25 ENCOUNTER — Inpatient Hospital Stay (HOSPITAL_COMMUNITY): Payer: Medicaid Other

## 2018-12-25 LAB — BPAM RBC
Blood Product Expiration Date: 202009182359
Blood Product Expiration Date: 202009192359
Unit Type and Rh: 1700
Unit Type and Rh: 1700

## 2018-12-25 LAB — CBC WITH DIFFERENTIAL/PLATELET
Abs Immature Granulocytes: 0.12 10*3/uL — ABNORMAL HIGH (ref 0.00–0.07)
Basophils Absolute: 0 10*3/uL (ref 0.0–0.1)
Basophils Relative: 0 %
Eosinophils Absolute: 0.2 10*3/uL (ref 0.0–0.5)
Eosinophils Relative: 2 %
HCT: 35.4 % — ABNORMAL LOW (ref 36.0–46.0)
Hemoglobin: 11.7 g/dL — ABNORMAL LOW (ref 12.0–15.0)
Immature Granulocytes: 1 %
Lymphocytes Relative: 25 %
Lymphs Abs: 2.3 10*3/uL (ref 0.7–4.0)
MCH: 30.1 pg (ref 26.0–34.0)
MCHC: 33.1 g/dL (ref 30.0–36.0)
MCV: 91 fL (ref 80.0–100.0)
Monocytes Absolute: 0.4 10*3/uL (ref 0.1–1.0)
Monocytes Relative: 4 %
Neutro Abs: 6.1 10*3/uL (ref 1.7–7.7)
Neutrophils Relative %: 68 %
Platelets: 319 10*3/uL (ref 150–400)
RBC: 3.89 MIL/uL (ref 3.87–5.11)
RDW: 14 % (ref 11.5–15.5)
WBC: 9.1 10*3/uL (ref 4.0–10.5)
nRBC: 0 % (ref 0.0–0.2)

## 2018-12-25 LAB — TYPE AND SCREEN
ABO/RH(D): B NEG
Antibody Screen: POSITIVE
Unit division: 0
Unit division: 0

## 2018-12-25 NOTE — Plan of Care (Signed)
?  Problem: Clinical Measurements: ?Goal: Will remain free from infection ?Outcome: Progressing ?  ?

## 2018-12-25 NOTE — Progress Notes (Addendum)
Daily Antepartum Note  Admission Date: 11/25/2018 Current Date: 12/25/2018 8:53 AM  Whitney Lowe is a 33 y.o. G3P0020 @ [redacted]w[redacted]d admitted for PPROM @ 26/6.  Pregnancy complicated by: Patient Active Problem List   Diagnosis Date Noted  . Tachycardia 12/01/2018  . Preterm premature rupture of membranes (PPROM), second trimester, antepartum 11/25/2018  . Antepartum placental abruption 10/05/2018  . Cervical mass 10/04/2018  . Fibroid uterus 10/04/2018  . Anemia affecting pregnancy, antepartum 09/06/2018  . Rubella non-immune status, antepartum 09/03/2018  . Rh negative, antepartum 09/03/2018  . Supervision of high risk pregnancy, antepartum 08/17/2018  . Long-term use of immunosuppressant medication-Stelara 07/21/2018  . [place]'s [place]ocolitis ([place] 06/19/2016    Overnight/24hr events:  none  Subjective:  +LOF clear; no VB, contractions, decreased FM  Objective:    Current Vital Signs 24h Vital Sign Ranges  T 98.2 F (36.8 C) Temp  Avg: 98.4 F (36.9 C)  Min: 98.2 F (36.8 C)  Max: 98.7 F (37.1 C)  BP (!) 96/55 BP  Min: 91/56  Max: 102/62  HR (!) 102 Pulse  Avg: 106.7  Min: 102  Max: 116  RR 18 Resp  Avg: 18  Min: 18  Max: 18  SaO2 100 % Room Air SpO2  Avg: 100 %  Min: 100 %  Max: 100 %       24 Hour I/O Current Shift I/O  Time Ins Outs No intake/output data recorded. No intake/output data recorded.    FHT: 135 baseline, +accels, no decel, mod varibility Toco: quiet x 230m Physical exam: General: Well nourished, well developed female in no acute distress. Abdomen: gravid nttp Respiratory: no resp distress Extremities: no clubbing, cyanosis or edema Skin: Warm and dry.   Medications: Current Facility-Administered Medications  Medication Dose Route Frequency Provider Last Rate Last Dose  . acetaminophen (TYLENOL) tablet 650 mg  650 mg Oral Q4H PRN WiSeabron SpatesCNM   650 mg at 12/02/18 0037  . bisacodyl (DULCOLAX) suppository 10 mg  10 mg Rectal Daily PRN  Anyanwu, Ugonna A, MD   10 mg at 11/30/18 1050  . calcium carbonate (TUMS - dosed in mg elemental calcium) chewable tablet 400 mg of elemental calcium  2 tablet Oral Q4H PRN WiSeabron SpatesCNM      . hydrocortisone (ANUSOL-HC) suppository 25 mg  25 mg Rectal BID PRN Anyanwu, UgSallyanne HaversMD   25 mg at 12/02/18 0558  . hydrocortisone-pramoxine (PROCTOFOAM-HC) rectal foam 1 applicator  1 applicator Rectal BID PRN ArWoodroe ModeMD      . magnesium hydroxide (MILK OF MAGNESIA) suspension 30 mL  30 mL Oral Daily PRN Anyanwu, Ugonna A, MD   30 mL at 11/30/18 1049  . ondansetron (ZOFRAN) injection 4 mg  4 mg Intravenous Q6H PRN Anyanwu, UgSallyanne HaversMD   4 mg at 11/26/18 0525  . senna-docusate (Senokot-S) tablet 2 tablet  2 tablet Oral BID Anyanwu, UgSallyanne HaversMD   2 tablet at 12/24/18 2119  . zolpidem (AMBIEN) tablet 5 mg  5 mg Oral QHS PRN WiSeabron SpatesCNM        Labs: no new labs  Radiology: no new imaging -8/1/61cephalic, oligo -8/7: 2109%945gm, AC @ 18%  Assessment & Plan:  Pt stable *Pregnancy: rNST. Fetal status reassuring. qday NSTs *PPROM: surveillance growth u/s today. delivery at 34wks and PRN. Consider rescue steroids but has been stable for so long.  -s/p bmz on 8/6 and 8/7 -s/p latency abx -s/p  NICU consult -s/p Mg on admission *Rh neg: s/p rhogam on 8/12 *PPx: SCDs, OOB ad lib *FEN/GI:  Regular diet. Last stelara on 8/4 (q8wk injection). No current issues *Dispo: inpatient until delivery with latest at [place] [facility])

## 2018-12-26 ENCOUNTER — Inpatient Hospital Stay (HOSPITAL_COMMUNITY): Payer: Medicaid Other

## 2018-12-26 DIAGNOSIS — O99613 Diseases of the digestive system complicating pregnancy, third trimester: Secondary | ICD-10-CM

## 2018-12-26 DIAGNOSIS — O99013 Anemia complicating pregnancy, third trimester: Secondary | ICD-10-CM

## 2018-12-26 DIAGNOSIS — Z3A31 31 weeks gestation of pregnancy: Secondary | ICD-10-CM

## 2018-12-26 DIAGNOSIS — O3413 Maternal care for benign tumor of corpus uteri, third trimester: Secondary | ICD-10-CM

## 2018-12-26 DIAGNOSIS — O360130 Maternal care for anti-D [Rh] antibodies, third trimester, not applicable or unspecified: Secondary | ICD-10-CM

## 2018-12-26 DIAGNOSIS — O42913 Preterm premature rupture of membranes, unspecified as to length of time between rupture and onset of labor, third trimester: Secondary | ICD-10-CM

## 2018-12-26 NOTE — Progress Notes (Signed)
Patient ID: Whitney Lowe, female   DOB: 01-12-1986, 33 y.o.   MRN: [id number]  S. She has no complaints, good FM, no bleeding or pain.  O. VSS, AF, FHR- reassuring Abd- benign, gravid, non-tender Ext- no evidence of DVT  A/P. Pt stable at 31.[redacted] weeks EGA with PPROM since 26.6 weeks *Pregnancy: rNST. Fetal status reassuring. qday NSTs *PPROM: surveillance growth u/s today. delivery at 34wks and PRN. Consider rescue steroids but has been stable for so long.  -s/p bmz on 8/6 and 8/7 -s/p latency abx -s/p NICU consult -s/p Mg on admission *Rh neg: s/p rhogam on 8/12 *PPx: SCDs, OOB ad lib *FEN/GI:  Regular diet. Last stelara on 8/4 (q8wk injection). No current issues *Dispo: inpatient until delivery with latest at 34/0

## 2018-12-27 NOTE — Progress Notes (Signed)
Daily Antepartum Note  Admission Date: 11/25/2018 Current Date: 12/27/2018 12:14 PM  Whitney Lowe is a 33 y.o. G3P0020 @ [redacted]w[redacted]d admitted for PPROM @ 26/6.  Pregnancy complicated by: Patient Active Problem List   Diagnosis Date Noted  . Tachycardia 12/01/2018  . Preterm premature rupture of membranes (PPROM), second trimester, antepartum 11/25/2018  . Antepartum placental abruption 10/05/2018  . Cervical mass 10/04/2018  . Fibroid uterus 10/04/2018  . Anemia affecting pregnancy, antepartum 09/06/2018  . Rubella non-immune status, antepartum 09/03/2018  . Rh negative, antepartum 09/03/2018  . Supervision of high risk pregnancy, antepartum 08/17/2018  . Long-term use of immunosuppressant medication-Stelara 07/21/2018  . Cro[place]'s [place]ocolitis ([place] 06/19/2016    Overnight/24hr events:  Normal growth yesterday  Subjective:  +LOF clear; no VB, contractions, decreased FM  Objective:    Current Vital Signs 24h Vital Sign Ranges  T 98.2 F (36.8 C) Temp  Avg: 98.1 F (36.7 C)  Min: 97.9 F (36.6 C)  Max: 98.2 F (36.8 C)  BP (!) 92/52 BP  Min: 91/55  Max: 95/58  HR (!) 112 Pulse  Avg: 106.3  Min: 99  Max: 112  RR 18 Resp  Avg: 18  Min: 18  Max: 18  SaO2 99 % Room Air SpO2  Avg: 99.5 %  Min: 99 %  Max: 100 %       24 Hour I/O Current Shift I/O  Time Ins Outs No intake/output data recorded. No intake/output data recorded.    FHT: 140 baseline, +accels, no decel, mod varibility Toco: ?rare UCs x 268m Physical exam: General: Well nourished, well developed female in no acute distress. Abdomen: gravid nttp Respiratory: no resp distress Extremities: no clubbing, cyanosis or edema Skin: Warm and dry.   Medications: Current Facility-Administered Medications  Medication Dose Route Frequency Provider Last Rate Last Dose  . acetaminophen (TYLENOL) tablet 650 mg  650 mg Oral Q4H PRN WiSeabron SpatesCNM   650 mg at 12/02/18 0037  . bisacodyl (DULCOLAX) suppository 10 mg  10 mg  Rectal Daily PRN Anyanwu, Ugonna A, MD   10 mg at 11/30/18 1050  . calcium carbonate (TUMS - dosed in mg elemental calcium) chewable tablet 400 mg of elemental calcium  2 tablet Oral Q4H PRN WiSeabron SpatesCNM      . hydrocortisone (ANUSOL-HC) suppository 25 mg  25 mg Rectal BID PRN Anyanwu, UgSallyanne HaversMD   25 mg at 12/02/18 0558  . hydrocortisone-pramoxine (PROCTOFOAM-HC) rectal foam 1 applicator  1 applicator Rectal BID PRN ArWoodroe ModeMD      . magnesium hydroxide (MILK OF MAGNESIA) suspension 30 mL  30 mL Oral Daily PRN Anyanwu, Ugonna A, MD   30 mL at 11/30/18 1049  . ondansetron (ZOFRAN) injection 4 mg  4 mg Intravenous Q6H PRN Anyanwu, UgSallyanne HaversMD   4 mg at 11/26/18 0525  . senna-docusate (Senokot-S) tablet 2 tablet  2 tablet Oral BID Anyanwu, Ugonna A, MD   2 tablet at 12/27/18 1013  . zolpidem (AMBIEN) tablet 5 mg  5 mg Oral QHS PRN WiSeabron SpatesCNM        Labs: no new labs  Radiology:  -9/6: ceph, 24%, 1647gm, ac 50% (all stable), oligo at 4, 4-5cm anterior fibroid -8/5/36cephalic, oligo -8/7: 2146%945gm, AC @ 18%  Assessment & Plan:  Pt stable *Pregnancy: rNST. Fetal status reassuring. qday NSTs *PPROM:delivery at 34wks and PRN. Consider rescue steroids but has been stable for so long.  -  s/p bmz on 8/6 and 8/7 -s/p latency abx -s/p NICU consult -s/p Mg on admission *Rh neg: s/p rhogam on 8/12 *PPx: SCDs, OOB ad lib *FEN/GI:  Regular diet. Last stelara on 8/4 (q8wk injection). No current issues *Dispo: inpatient until delivery with latest at [place] [facility])

## 2018-12-28 LAB — TYPE AND SCREEN
ABO/RH(D): B NEG
Antibody Screen: POSITIVE
Unit division: 0
Unit division: 0

## 2018-12-28 LAB — CBC WITH DIFFERENTIAL/PLATELET
Abs Immature Granulocytes: 0.1 10*3/uL — ABNORMAL HIGH (ref 0.00–0.07)
Basophils Absolute: 0 10*3/uL (ref 0.0–0.1)
Basophils Relative: 0 %
Eosinophils Absolute: 0.2 10*3/uL (ref 0.0–0.5)
Eosinophils Relative: 2 %
HCT: 32.9 % — ABNORMAL LOW (ref 36.0–46.0)
Hemoglobin: 10.7 g/dL — ABNORMAL LOW (ref 12.0–15.0)
Immature Granulocytes: 1 %
Lymphocytes Relative: 22 %
Lymphs Abs: 2.3 10*3/uL (ref 0.7–4.0)
MCH: 29.8 pg (ref 26.0–34.0)
MCHC: 32.5 g/dL (ref 30.0–36.0)
MCV: 91.6 fL (ref 80.0–100.0)
Monocytes Absolute: 0.4 10*3/uL (ref 0.1–1.0)
Monocytes Relative: 4 %
Neutro Abs: 7.2 10*3/uL (ref 1.7–7.7)
Neutrophils Relative %: 71 %
Platelets: 329 10*3/uL (ref 150–400)
RBC: 3.59 MIL/uL — ABNORMAL LOW (ref 3.87–5.11)
RDW: 13.9 % (ref 11.5–15.5)
WBC: 10.3 10*3/uL (ref 4.0–10.5)
nRBC: 0 % (ref 0.0–0.2)

## 2018-12-28 LAB — BPAM RBC
Blood Product Expiration Date: 202010032359
Blood Product Expiration Date: 202010082359
Unit Type and Rh: 1700
Unit Type and Rh: 1700

## 2018-12-28 NOTE — Progress Notes (Signed)
Daily Antepartum Note  Admission Date: 11/25/2018 Current Date: 12/28/2018 8:00 AM  Whitney Lowe is a 33 y.o. G3P0020 @ [redacted]w[redacted]d admitted for PPROM @ 26/6.  Pregnancy complicated by: Patient Active Problem List   Diagnosis Date Noted  . Tachycardia 12/01/2018  . Preterm premature rupture of membranes (PPROM), second trimester, antepartum 11/25/2018  . Antepartum placental abruption 10/05/2018  . Cervical mass 10/04/2018  . Fibroid uterus 10/04/2018  . Anemia affecting pregnancy, antepartum 09/06/2018  . Rubella non-immune status, antepartum 09/03/2018  . Rh negative, antepartum 09/03/2018  . Supervision of high risk pregnancy, antepartum 08/17/2018  . Long-term use of immunosuppressant medication-Stelara 07/21/2018  . Cro[place]'s [place]ocolitis ([place] 06/19/2016    Overnight/24hr events:  none  Subjective:  +LOF clear; no VB, contractions, decreased FM  Objective:    Current Vital Signs 24h Vital Sign Ranges  T 98 F (36.7 C) Temp  Avg: 98.1 F (36.7 C)  Min: 97.9 F (36.6 C)  Max: 98.3 F (36.8 C)  BP (pt sleeping, respect sleep order) BP  Min: 92/52  Max: 94/66  HR (!) 102 Pulse  Avg: 109.5  Min: 102  Max: 113  RR 18 Resp  Avg: 18  Min: 18  Max: 18  SaO2 100 % Room Air SpO2  Avg: 99.3 %  Min: 99 %  Max: 100 %       24 Hour I/O Current Shift I/O  Time Ins Outs No intake/output data recorded. No intake/output data recorded.    FHT: 140 baseline, +accels, one episodic decel, mod varibility Toco: quiet x 25m Physical exam: General: Well nourished, well developed female in no acute distress. Abdomen: gravid nttp Respiratory: no resp distress Extremities: no clubbing, cyanosis or edema Skin: Warm and dry.   Medications: Current Facility-Administered Medications  Medication Dose Route Frequency Provider Last Rate Last Dose  . acetaminophen (TYLENOL) tablet 650 mg  650 mg Oral Q4H PRN WiSeabron SpatesCNM   650 mg at 12/02/18 0037  . bisacodyl (DULCOLAX) suppository 10  mg  10 mg Rectal Daily PRN Anyanwu, Ugonna A, MD   10 mg at 11/30/18 1050  . calcium carbonate (TUMS - dosed in mg elemental calcium) chewable tablet 400 mg of elemental calcium  2 tablet Oral Q4H PRN WiSeabron SpatesCNM      . hydrocortisone (ANUSOL-HC) suppository 25 mg  25 mg Rectal BID PRN Anyanwu, UgSallyanne HaversMD   25 mg at 12/02/18 0558  . hydrocortisone-pramoxine (PROCTOFOAM-HC) rectal foam 1 applicator  1 applicator Rectal BID PRN ArWoodroe ModeMD      . magnesium hydroxide (MILK OF MAGNESIA) suspension 30 mL  30 mL Oral Daily PRN Anyanwu, Ugonna A, MD   30 mL at 11/30/18 1049  . ondansetron (ZOFRAN) injection 4 mg  4 mg Intravenous Q6H PRN Anyanwu, UgSallyanne HaversMD   4 mg at 11/26/18 0525  . senna-docusate (Senokot-S) tablet 2 tablet  2 tablet Oral BID Anyanwu, Ugonna A, MD   2 tablet at 12/27/18 2039  . zolpidem (AMBIEN) tablet 5 mg  5 mg Oral QHS PRN WiSeabron SpatesCNM        Labs: no new labs  Radiology:  -9/6: ceph, 24%, 1647gm, ac 50% (all stable), oligo at 4, 4-5cm anterior fibroid -8/5/09cephalic, oligo -8/7: 2132%945gm, AC @ 18%  Assessment & Plan:  Pt stable *Pregnancy: rNST. Fetal status reassuring. Bid NSTs *PPROM:delivery at 34wks and PRN. Consider rescue steroids but has been stable for so long.  -  s/p bmz on 8/6 and 8/7 -s/p latency abx -s/p NICU consult -s/p Mg on admission *Rh neg: s/p rhogam on 8/12 *PPx: SCDs, OOB ad lib *FEN/GI:  Regular diet. Last stelara on 8/4 (q8wk injection). No current issues *Dispo: inpatient until delivery with latest at [place] [facility])

## 2018-12-29 NOTE — Progress Notes (Signed)
FACULTY PRACTICE ANTEPARTUM PROGRESS NOTE  Whitney Lowe is a 33 y.o. G3P0020 at 15w5dwho is admitted for rupture of membranes.  Estimated Date of Delivery: 02/25/19 Fetal presentation is cephalic.  Length of Stay:  34 Days. Admitted 11/25/2018  Subjective:  Patient reports normal fetal movement.  She denies uterine contractions, denies bleeding and leaking of fluid per vagina.  Vitals:  Blood pressure 93/63, pulse (!) 106, temperature 97.6 F (36.4 C), temperature source Oral, resp. rate 17, height 4' 10"  (1.473 m), weight 56.2 kg, last menstrual period 05/21/2018, SpO2 99 %. Physical Examination: CONSTITUTIONAL: Well-developed, well-nourished female in no acute distress.  HENT:  Normocephalic, atraumatic, External right and left ear normal. Oropharynx is clear and moist EYES: Conjunctivae and EOM are normal. Pupils are equal, round, and reactive to light. No scleral icterus.  NECK: Normal range of motion, supple, no masses. SKIN: Skin is warm and dry. No rash noted. Not diaphoretic. No erythema. No pallor. [place] Alert and oriented to person, place, and time. Normal reflexes, muscle tone coordination. No cranial nerve deficit noted. PSYCHIATRIC: Normal mood and affect. Normal behavior. Normal judgment and thought content. CARDIOVASCULAR: Normal heart rate noted RESPIRATORY: Effort normal, no problems with respiration noted MUSCULOSKELETAL: Normal range of motion. No edema and no tenderness. ABDOMEN: Soft, nontender, nondistended, gravid. CERVIX: deferred  Fetal monitoring: FHR: 150 bpm, Variability: moderate, Accelerations: Present, Decelerations: Absent  Uterine activity: no contractions per hour  Results for orders placed or performed during the hospital encounter of 11/25/18 (from the past 48 hour(s))  CBC with Differential/Platelet     Status: Abnormal   Collection Time: 12/28/18 11:03 AM  Result Value Ref Range   WBC 10.3 4.0 - 10.5 K/uL   RBC 3.59 (L) 3.87 - 5.11 MIL/uL   Hemoglobin 10.7 (L) 12.0 - 15.0 g/dL   HCT 32.9 (L) 36.0 - 46.0 %   MCV 91.6 80.0 - 100.0 fL   MCH 29.8 26.0 - 34.0 pg   MCHC 32.5 30.0 - 36.0 g/dL   RDW 13.9 11.5 - 15.5 %   Platelets 329 150 - 400 K/uL   nRBC 0.0 0.0 - 0.2 %   Neutrophils Relative % 71 %   Neutro Abs 7.2 1.7 - 7.7 K/uL   Lymphocytes Relative 22 %   Lymphs Abs 2.3 0.7 - 4.0 K/uL   Monocytes Relative 4 %   Monocytes Absolute 0.4 0.1 - 1.0 K/uL   Eosinophils Relative 2 %   Eosinophils Absolute 0.2 0.0 - 0.5 K/uL   Basophils Relative 0 %   Basophils Absolute 0.0 0.0 - 0.1 K/uL   Immature Granulocytes 1 %   Abs Immature Granulocytes 0.10 (H) 0.00 - 0.07 K/uL    Comment: Performed at [facility] [address] [place] [address] Type and screen     Status: None (Preliminary result)   Collection Time: 12/28/18 11:03 AM  Result Value Ref Range   ABO/RH(D) B NEG    Antibody Screen POS    Sample Expiration 12/31/2018,2359    Antibody Identification      PASSIVELY ACQUIRED ANTI-D Performed at [facility] [address] [place] [address]   Unit Number [id number]   Blood Component Type RED CELLS,LR    Unit division 00    Status of Unit ALLOCATED    Transfusion Status OK TO TRANSFUSE    Crossmatch Result COMPATIBLE    Unit Number [id number]   Blood Component Type RED CELLS,LR  Unit division 00    Status of Unit ALLOCATED    Transfusion Status OK TO TRANSFUSE    Crossmatch Result COMPATIBLE     I have reviewed the patient's current medications.  ASSESSMENT: Principal Problem:   Preterm premature rupture of membranes (PPROM), second trimester, antepartum Active Problems:   Crohn's ileocolitis (HCC)   Long-term use of immunosuppressant medication-Stelara   Supervision of high risk pregnancy, antepartum   Rubella non-immune status, antepartum   Rh negative, antepartum   Anemia affecting pregnancy, antepartum   Cervical mass   Fibroid uterus   Antepartum  placental abruption   Tachycardia   PLAN:  S/p latency abx S/p BTMZ S/p NICU consult  Hopeful for delivery at 34 weeks  Continue routine antenatal care.   Feliz Beam, M.D. [facility] [employer] [facility] (Faculty Practice)  12/29/2018 11:43 AM

## 2018-12-30 ENCOUNTER — Inpatient Hospital Stay (HOSPITAL_COMMUNITY): Payer: Medicaid Other

## 2018-12-30 DIAGNOSIS — O99613 Diseases of the digestive system complicating pregnancy, third trimester: Secondary | ICD-10-CM

## 2018-12-30 DIAGNOSIS — O99013 Anemia complicating pregnancy, third trimester: Secondary | ICD-10-CM

## 2018-12-30 DIAGNOSIS — Z3A31 31 weeks gestation of pregnancy: Secondary | ICD-10-CM

## 2018-12-30 DIAGNOSIS — O289 Unspecified abnormal findings on antenatal screening of mother: Secondary | ICD-10-CM

## 2018-12-30 DIAGNOSIS — O3413 Maternal care for benign tumor of corpus uteri, third trimester: Secondary | ICD-10-CM

## 2018-12-30 DIAGNOSIS — O42913 Preterm premature rupture of membranes, unspecified as to length of time between rupture and onset of labor, third trimester: Secondary | ICD-10-CM

## 2018-12-30 DIAGNOSIS — O360130 Maternal care for anti-D [Rh] antibodies, third trimester, not applicable or unspecified: Secondary | ICD-10-CM

## 2018-12-30 NOTE — Progress Notes (Signed)
FACULTY PRACTICE ANTEPARTUM PROGRESS NOTE  Whitney Lowe is a 33 y.o. G3P0020 at 67w6dwho is admitted for rupture of membranes.  Estimated Date of Delivery: 02/25/19 Fetal presentation is cephalic.  Length of Stay:  35 Days. Admitted 11/25/2018  Subjective:  Patient reports normal fetal movement.  She reports no uterine contractions but does have an ache in her side, thinks it may be from sleeping wrong. Denies leaking but reports some small blood clots and a small amount of bleeding when she used the restroom this am, has not happened in several weeks.  Vitals:  Blood pressure 97/61, pulse (!) 108, temperature 98 F (36.7 C), temperature source Oral, resp. rate 18, height 4' 10"  (1.473 m), weight 56.2 kg, last menstrual period 05/21/2018, SpO2 100 %. Physical Examination: CONSTITUTIONAL: Well-developed, well-nourished female in no acute distress.  HENT:  Normocephalic, atraumatic, External right and left ear normal. Oropharynx is clear and moist EYES: Conjunctivae and EOM are normal. Pupils are equal, round, and reactive to light. No scleral icterus.  NECK: Normal range of motion, supple, no masses. SKIN: Skin is warm and dry. No rash noted. Not diaphoretic. No erythema. No pallor. [place] Alert and oriented to person, place, and time. Normal reflexes, muscle tone coordination. No cranial nerve deficit noted. PSYCHIATRIC: Normal mood and affect. Normal behavior. Normal judgment and thought content. CARDIOVASCULAR: Normal heart rate noted RESPIRATORY: Effort normal, no problems with respiration noted MUSCULOSKELETAL: Normal range of motion. No edema and no tenderness. ABDOMEN: Soft, nontender, nondistended, gravid. CERVIX: deferred  Fetal monitoring: FHR: 150 bpm, Variability: minimal to moderate, Accelerations: Present, Decelerations: Absent  Uterine activity: no contractions per hour  Results for orders placed or performed during the hospital encounter of 11/25/18 (from the past 48  hour(s))  CBC with Differential/Platelet     Status: Abnormal   Collection Time: 12/28/18 11:03 AM  Result Value Ref Range   WBC 10.3 4.0 - 10.5 K/uL   RBC 3.59 (L) 3.87 - 5.11 MIL/uL   Hemoglobin 10.7 (L) 12.0 - 15.0 g/dL   HCT 32.9 (L) 36.0 - 46.0 %   MCV 91.6 80.0 - 100.0 fL   MCH 29.8 26.0 - 34.0 pg   MCHC 32.5 30.0 - 36.0 g/dL   RDW 13.9 11.5 - 15.5 %   Platelets 329 150 - 400 K/uL   nRBC 0.0 0.0 - 0.2 %   Neutrophils Relative % 71 %   Neutro Abs 7.2 1.7 - 7.7 K/uL   Lymphocytes Relative 22 %   Lymphs Abs 2.3 0.7 - 4.0 K/uL   Monocytes Relative 4 %   Monocytes Absolute 0.4 0.1 - 1.0 K/uL   Eosinophils Relative 2 %   Eosinophils Absolute 0.2 0.0 - 0.5 K/uL   Basophils Relative 0 %   Basophils Absolute 0.0 0.0 - 0.1 K/uL   Immature Granulocytes 1 %   Abs Immature Granulocytes 0.10 (H) 0.00 - 0.07 K/uL    Comment: Performed at [facility] [address], [place] [address] Type and screen     Status: None (Preliminary result)   Collection Time: 12/28/18 11:03 AM  Result Value Ref Range   ABO/RH(D) B NEG    Antibody Screen POS    Sample Expiration 12/31/2018,2359    Antibody Identification      PASSIVELY ACQUIRED ANTI-D Performed at [facility] [address] [place] [address]   Unit Number [id number]   Blood Component Type RED CELLS,LR    Unit  division 00    Status of Unit ALLOCATED    Transfusion Status OK TO TRANSFUSE    Crossmatch Result COMPATIBLE    Unit Number [id number]    Blood Component Type RED CELLS,LR    Unit division 00    Status of Unit ALLOCATED    Transfusion Status OK TO TRANSFUSE    Crossmatch Result COMPATIBLE     I have reviewed the patient's current medications.  ASSESSMENT: Principal Problem:   Preterm premature rupture of membranes (PPROM), second trimester, antepartum Active Problems:   Crohn's ileocolitis (HCC)   Long-term use of immunosuppressant medication-Stelara   Supervision of high  risk pregnancy, antepartum   Rubella non-immune status, antepartum   Rh negative, antepartum   Anemia affecting pregnancy, antepartum   Cervical mass   Fibroid uterus   Antepartum placental abruption   Tachycardia   PLAN: Will monitor bleeding closely, reviewed need for delivery if indicated for fetal status S/p latency abx S/p BTMZ S/p NICU consult  Hopeful for delivery at 34 weeks   Continue routine antenatal care.   Feliz Beam, M.D. [facility] [employer] [facility] (Faculty Practice)  12/30/2018 8:53 AM

## 2018-12-31 LAB — CBC WITH DIFFERENTIAL/PLATELET
Abs Immature Granulocytes: 0.14 10*3/uL — ABNORMAL HIGH (ref 0.00–0.07)
Basophils Absolute: 0 10*3/uL (ref 0.0–0.1)
Basophils Relative: 0 %
Eosinophils Absolute: 0.1 10*3/uL (ref 0.0–0.5)
Eosinophils Relative: 1 %
HCT: 33.3 % — ABNORMAL LOW (ref 36.0–46.0)
Hemoglobin: 11.1 g/dL — ABNORMAL LOW (ref 12.0–15.0)
Immature Granulocytes: 1 %
Lymphocytes Relative: 17 %
Lymphs Abs: 1.9 10*3/uL (ref 0.7–4.0)
MCH: 30.2 pg (ref 26.0–34.0)
MCHC: 33.3 g/dL (ref 30.0–36.0)
MCV: 90.5 fL (ref 80.0–100.0)
Monocytes Absolute: 0.6 10*3/uL (ref 0.1–1.0)
Monocytes Relative: 6 %
Neutro Abs: 8.3 10*3/uL — ABNORMAL HIGH (ref 1.7–7.7)
Neutrophils Relative %: 75 %
Platelets: 321 10*3/uL (ref 150–400)
RBC: 3.68 MIL/uL — ABNORMAL LOW (ref 3.87–5.11)
RDW: 13.7 % (ref 11.5–15.5)
WBC: 11.1 10*3/uL — ABNORMAL HIGH (ref 4.0–10.5)
nRBC: 0 % (ref 0.0–0.2)

## 2018-12-31 LAB — TYPE AND SCREEN
ABO/RH(D): B NEG
Antibody Screen: POSITIVE
Unit division: 0
Unit division: 0

## 2018-12-31 LAB — BPAM RBC
Blood Product Expiration Date: 202009222359
Blood Product Expiration Date: 202010082359
ISSUE DATE / TIME: 202009042047
Unit Type and Rh: 1700
Unit Type and Rh: 1700

## 2018-12-31 NOTE — Progress Notes (Signed)
FACULTY PRACTICE ANTEPARTUM PROGRESS NOTE  Whitney Lowe is a 33 y.o. G3P0020 at 42w0dwho is admitted for rupture of membranes.  Estimated Date of Delivery: 02/25/19 Fetal presentation is cephalic.  Length of Stay:  36 Days. Admitted 11/25/2018  Subjective:  Patient reports normal fetal movement.  She denies uterine contractions, denies bleeding and leaking of fluid per vagina. Does report her fibroid is hurting her, states it has hurt like this in the past but is not as bad as it has been. Heating pad helps. Denies foul smelling discharge, fever, chills.  Vitals:  Blood pressure 98/64, pulse (!) 136, temperature 98.1 F (36.7 C), temperature source Oral, resp. rate 20, height 4' 10"  (1.473 m), weight 56.2 kg, last menstrual period 05/21/2018, SpO2 100 %. Physical Examination: CONSTITUTIONAL: Well-developed, well-nourished female in no acute distress.  HENT:  Normocephalic, atraumatic, External right and left ear normal. Oropharynx is clear and moist EYES: Conjunctivae and EOM are normal. Pupils are equal, round, and reactive to light. No scleral icterus.  NECK: Normal range of motion, supple, no masses. SKIN: Skin is warm and dry. No rash noted. Not diaphoretic. No erythema. No pallor. [place] Alert and oriented to person, place, and time. Normal reflexes, muscle tone coordination. No cranial nerve deficit noted. PSYCHIATRIC: Normal mood and affect. Normal behavior. Normal judgment and thought content. CARDIOVASCULAR: Normal heart rate noted RESPIRATORY: Effort normal, no problems with respiration noted MUSCULOSKELETAL: Normal range of motion. No edema and no tenderness. ABDOMEN: Soft, mildly tender in midline lower uterus over fibroid, otherwise nontender, nondistended, gravid. CERVIX: deferred  Fetal monitoring: FHR: 140 bpm, Variability: moderate, Accelerations: Present, Decelerations: Absent  Uterine activity: no contractions per hour  Results for orders placed or performed during  the hospital encounter of 11/25/18 (from the past 48 hour(s))  CBC with Differential/Platelet     Status: Abnormal   Collection Time: 12/31/18 11:10 AM  Result Value Ref Range   WBC 11.1 (H) 4.0 - 10.5 K/uL   RBC 3.68 (L) 3.87 - 5.11 MIL/uL   Hemoglobin 11.1 (L) 12.0 - 15.0 g/dL   HCT 33.3 (L) 36.0 - 46.0 %   MCV 90.5 80.0 - 100.0 fL   MCH 30.2 26.0 - 34.0 pg   MCHC 33.3 30.0 - 36.0 g/dL   RDW 13.7 11.5 - 15.5 %   Platelets 321 150 - 400 K/uL   nRBC 0.0 0.0 - 0.2 %   Neutrophils Relative % 75 %   Neutro Abs 8.3 (H) 1.7 - 7.7 K/uL   Lymphocytes Relative 17 %   Lymphs Abs 1.9 0.7 - 4.0 K/uL   Monocytes Relative 6 %   Monocytes Absolute 0.6 0.1 - 1.0 K/uL   Eosinophils Relative 1 %   Eosinophils Absolute 0.1 0.0 - 0.5 K/uL   Basophils Relative 0 %   Basophils Absolute 0.0 0.0 - 0.1 K/uL   Immature Granulocytes 1 %   Abs Immature Granulocytes 0.14 (H) 0.00 - 0.07 K/uL    Comment: Performed at [facility] [address], [place] [address] Type and screen     Status: None   Collection Time: 12/31/18 11:10 AM  Result Value Ref Range   ABO/RH(D) B NEG    Antibody Screen POS    Sample Expiration      01/03/2019,2359 Performed at [facility] [address] [place] [address]    I have reviewed the patient's current medications.  ASSESSMENT: Principal Problem:   Preterm premature rupture  of membranes (PPROM), second trimester, antepartum Active Problems:   Crohn's ileocolitis ([place])   Long-term use of immunosuppressant medication-Stelara   Supervision of high risk pregnancy, antepartum   Rubella non-immune status, antepartum   Rh negative, antepartum   Anemia affecting pregnancy, antepartum   Cervical mass   Fibroid uterus   Antepartum placental abruption   Tachycardia   PLAN:  S/p latency abx S/p BTMZ S/p NICU consult  Hopeful for delivery at 34 weeks   Continue routine antenatal care.   Feliz Beam,  M.D. [facility] [employer] [facility] (Faculty Practice)  12/31/2018 1:38 PM

## 2018-12-31 NOTE — Progress Notes (Signed)
Fetal monitor applied at 2052.  Contracting q2-52mnutes, lasting 50-60 seconds with the belly band holding the toco on.  Patient stated not feeling any type of pain.  After voiding and changing from the belly band to the belt to hold toco on at 2129, no contractions noted.  EFM removed at 2200.

## 2018-12-31 NOTE — Plan of Care (Signed)
  Problem: Coping: Goal: Level of anxiety will decrease Outcome: Completed/Met   Problem: Elimination: Goal: Will not experience complications related to bowel motility Outcome: Completed/Met   Problem: Education: Goal: Knowledge of disease or condition will improve Outcome: Completed/Met Goal: Knowledge of the prescribed therapeutic regimen will improve Outcome: Completed/Met

## 2019-01-01 ENCOUNTER — Encounter (HOSPITAL_COMMUNITY): Payer: Self-pay | Admitting: Emergency Medicine

## 2019-01-01 ENCOUNTER — Inpatient Hospital Stay (HOSPITAL_COMMUNITY): Payer: Medicaid Other | Admitting: Anesthesiology

## 2019-01-01 ENCOUNTER — Encounter (HOSPITAL_COMMUNITY)
Admission: AD | Disposition: A | Discharge status: Home / Self Care | Payer: Self-pay | Source: Ambulatory Visit | Attending: Obstetrics and Gynecology

## 2019-01-01 DIAGNOSIS — O42113 Preterm premature rupture of membranes, onset of labor more than 24 hours following rupture, third trimester: Secondary | ICD-10-CM

## 2019-01-01 DIAGNOSIS — O4590 Premature separation of placenta, unspecified, unspecified trimester: Secondary | ICD-10-CM

## 2019-01-01 DIAGNOSIS — Z3A32 32 weeks gestation of pregnancy: Secondary | ICD-10-CM

## 2019-01-01 DIAGNOSIS — O4593 Premature separation of placenta, unspecified, third trimester: Secondary | ICD-10-CM

## 2019-01-01 SURGERY — Surgical Case
Anesthesia: Epidural | Wound class: Clean Contaminated

## 2019-01-01 MED ORDER — FENTANYL CITRATE (PF) 100 MCG/2ML IJ SOLN
INTRAMUSCULAR | Status: AC
  Filled 2019-01-01: qty 2

## 2019-01-01 MED ORDER — FLEET ENEMA 7-19 GM/118ML RE ENEM: 1.0000 | ENEMA | RECTAL | Status: DC | PRN

## 2019-01-01 MED ORDER — OXYTOCIN 40 UNITS IN NORMAL SALINE INFUSION - SIMPLE MED: 2.5000 [IU]/h | INTRAVENOUS | Status: AC

## 2019-01-01 MED ORDER — WITCH HAZEL-GLYCERIN EX PADS
1.0000 "application " | MEDICATED_PAD | CUTANEOUS | Status: DC | PRN
  Filled 2019-01-01: qty 100

## 2019-01-01 MED ORDER — ONDANSETRON HCL 4 MG/2ML IJ SOLN
INTRAMUSCULAR | Status: AC
  Filled 2019-01-01: qty 2

## 2019-01-01 MED ORDER — BUPIVACAINE HCL (PF) 0.5 % IJ SOLN
INTRAMUSCULAR | Status: AC
  Filled 2019-01-01: qty 30

## 2019-01-01 MED ORDER — OXYCODONE HCL 5 MG PO TABS
5.0000 mg | ORAL_TABLET | ORAL | Status: DC | PRN
  Administered 2019-01-03 – 2019-01-04 (×3): 5 mg via ORAL
  Administered 2019-01-04: 10 mg via ORAL
  Administered 2019-01-04: 5 mg via ORAL
  Filled 2019-01-01: qty 2
  Filled 2019-01-01 (×4): qty 1

## 2019-01-01 MED ORDER — DIPHENHYDRAMINE HCL 50 MG/ML IJ SOLN: 12.5000 mg | INTRAMUSCULAR | Status: DC | PRN

## 2019-01-01 MED ORDER — ONDANSETRON HCL 4 MG/2ML IJ SOLN
INTRAMUSCULAR | Status: DC | PRN
  Administered 2019-01-01: 4 mg via INTRAVENOUS

## 2019-01-01 MED ORDER — PENICILLIN G 3 MILLION UNITS IVPB - SIMPLE MED
3.0000 10*6.[IU] | INTRAVENOUS | Status: DC
  Administered 2019-01-01: 15:00:00 3 10*6.[IU] via INTRAVENOUS
  Filled 2019-01-01 (×7): qty 100

## 2019-01-01 MED ORDER — SENNOSIDES-DOCUSATE SODIUM 8.6-50 MG PO TABS
2.0000 | ORAL_TABLET | ORAL | Status: DC
  Administered 2019-01-01 – 2019-01-04 (×3): 2 via ORAL
  Filled 2019-01-01 (×4): qty 2

## 2019-01-01 MED ORDER — OXYCODONE-ACETAMINOPHEN 5-325 MG PO TABS: 1.0000 | ORAL_TABLET | ORAL | Status: DC | PRN

## 2019-01-01 MED ORDER — NALBUPHINE HCL 10 MG/ML IJ SOLN
5.0000 mg | INTRAMUSCULAR | Status: DC | PRN
  Filled 2019-01-01: qty 0.5

## 2019-01-01 MED ORDER — MIDAZOLAM HCL 2 MG/2ML IJ SOLN
INTRAMUSCULAR | Status: DC | PRN
  Administered 2019-01-01: 2 mg via INTRAVENOUS

## 2019-01-01 MED ORDER — MORPHINE SULFATE (PF) 0.5 MG/ML IJ SOLN
INTRAMUSCULAR | Status: AC
  Filled 2019-01-01: qty 10

## 2019-01-01 MED ORDER — MIDAZOLAM HCL 2 MG/2ML IJ SOLN: 1.0000 mg | Freq: Once | INTRAMUSCULAR | Status: DC

## 2019-01-01 MED ORDER — ACETAMINOPHEN 325 MG PO TABS: 650.0000 mg | ORAL_TABLET | ORAL | Status: DC | PRN

## 2019-01-01 MED ORDER — MIDAZOLAM HCL 2 MG/2ML IJ SOLN
INTRAMUSCULAR | Status: AC
  Filled 2019-01-01: qty 2

## 2019-01-01 MED ORDER — DIBUCAINE (PERIANAL) 1 % EX OINT
1.0000 "application " | TOPICAL_OINTMENT | CUTANEOUS | Status: DC | PRN
  Filled 2019-01-01: qty 28

## 2019-01-01 MED ORDER — MENTHOL 3 MG MT LOZG
1.0000 | LOZENGE | OROMUCOSAL | Status: DC | PRN
  Filled 2019-01-01: qty 9

## 2019-01-01 MED ORDER — FENTANYL-BUPIVACAINE-NACL 0.5-0.125-0.9 MG/250ML-% EP SOLN: 12.0000 mL/h | EPIDURAL | Status: DC | PRN

## 2019-01-01 MED ORDER — COCONUT OIL OIL
1.0000 "application " | TOPICAL_OIL | Status: DC | PRN
  Administered 2019-01-03: 1 via TOPICAL
  Filled 2019-01-01: qty 120

## 2019-01-01 MED ORDER — ONDANSETRON HCL 4 MG/2ML IJ SOLN: 4.0000 mg | Freq: Four times a day (QID) | INTRAMUSCULAR | Status: DC | PRN

## 2019-01-01 MED ORDER — FENTANYL CITRATE (PF) 100 MCG/2ML IJ SOLN
INTRAMUSCULAR | Status: AC
  Administered 2019-01-01: 12:00:00 100 ug via INTRAVENOUS
  Filled 2019-01-01: qty 2

## 2019-01-01 MED ORDER — SODIUM CHLORIDE 0.9 % IV SOLN
INTRAVENOUS | Status: DC | PRN
  Administered 2019-01-01: 16:00:00 via INTRAVENOUS

## 2019-01-01 MED ORDER — PRENATAL MULTIVITAMIN CH
1.0000 | ORAL_TABLET | Freq: Every day | ORAL | Status: DC
  Administered 2019-01-03: 1 via ORAL
  Filled 2019-01-01 (×3): qty 1

## 2019-01-01 MED ORDER — MISOPROSTOL 50MCG HALF TABLET
50.0000 ug | ORAL_TABLET | ORAL | Status: DC | PRN
  Administered 2019-01-01: 11:00:00 50 ug via ORAL
  Filled 2019-01-01: qty 1

## 2019-01-01 MED ORDER — TERBUTALINE SULFATE 1 MG/ML IJ SOLN: 0.2500 mg | Freq: Once | INTRAMUSCULAR | Status: DC | PRN

## 2019-01-01 MED ORDER — OXYCODONE-ACETAMINOPHEN 5-325 MG PO TABS
1.0000 | ORAL_TABLET | Freq: Once | ORAL | Status: AC
  Administered 2019-01-01: 08:00:00 2 via ORAL
  Filled 2019-01-01: qty 2

## 2019-01-01 MED ORDER — ENOXAPARIN SODIUM 40 MG/0.4ML ~~LOC~~ SOLN
40.0000 mg | SUBCUTANEOUS | Status: DC
  Administered 2019-01-02 – 2019-01-03 (×2): 40 mg via SUBCUTANEOUS
  Filled 2019-01-01 (×4): qty 0.4

## 2019-01-01 MED ORDER — PHENYLEPHRINE 40 MCG/ML (10ML) SYRINGE FOR IV PUSH (FOR BLOOD PRESSURE SUPPORT): 80.0000 ug | PREFILLED_SYRINGE | INTRAVENOUS | Status: DC | PRN

## 2019-01-01 MED ORDER — SIMETHICONE 80 MG PO CHEW
80.0000 mg | CHEWABLE_TABLET | ORAL | Status: DC
  Administered 2019-01-01 – 2019-01-04 (×3): 80 mg via ORAL
  Filled 2019-01-01 (×4): qty 1

## 2019-01-01 MED ORDER — SCOPOLAMINE 1 MG/3DAYS TD PT72
MEDICATED_PATCH | TRANSDERMAL | Status: AC
  Filled 2019-01-01: qty 1

## 2019-01-01 MED ORDER — SCOPOLAMINE 1 MG/3DAYS TD PT72: 1.0000 | MEDICATED_PATCH | Freq: Once | TRANSDERMAL | Status: DC

## 2019-01-01 MED ORDER — SODIUM CHLORIDE 0.9 % IV SOLN
5.0000 10*6.[IU] | Freq: Once | INTRAVENOUS | Status: AC
  Administered 2019-01-01: 11:00:00 5 10*6.[IU] via INTRAVENOUS
  Filled 2019-01-01: qty 5

## 2019-01-01 MED ORDER — LIDOCAINE HCL (PF) 1 % IJ SOLN: 30.0000 mL | INTRAMUSCULAR | Status: DC | PRN

## 2019-01-01 MED ORDER — OXYTOCIN 40 UNITS IN NORMAL SALINE INFUSION - SIMPLE MED
INTRAVENOUS | Status: AC
  Filled 2019-01-01: qty 1000

## 2019-01-01 MED ORDER — TETANUS-DIPHTH-ACELL PERTUSSIS 5-2.5-18.5 LF-MCG/0.5 IM SUSP
0.5000 mL | Freq: Once | INTRAMUSCULAR | Status: DC
  Filled 2019-01-01: qty 0.5

## 2019-01-01 MED ORDER — OXYTOCIN 40 UNITS IN NORMAL SALINE INFUSION - SIMPLE MED
2.5000 [IU]/h | INTRAVENOUS | Status: DC
  Filled 2019-01-01: qty 1000

## 2019-01-01 MED ORDER — LACTATED RINGERS IV SOLN: 500.0000 mL | INTRAVENOUS | Status: DC | PRN

## 2019-01-01 MED ORDER — FENTANYL-BUPIVACAINE-NACL 0.5-0.125-0.9 MG/250ML-% EP SOLN
12.0000 mL/h | EPIDURAL | Status: DC | PRN
  Filled 2019-01-01: qty 250

## 2019-01-01 MED ORDER — DEXAMETHASONE SODIUM PHOSPHATE 4 MG/ML IJ SOLN
INTRAMUSCULAR | Status: DC | PRN
  Administered 2019-01-01: 4 mg via INTRAVENOUS

## 2019-01-01 MED ORDER — NALOXONE HCL 0.4 MG/ML IJ SOLN: 0.4000 mg | INTRAMUSCULAR | Status: DC | PRN

## 2019-01-01 MED ORDER — LIDOCAINE-EPINEPHRINE (PF) 2 %-1:200000 IJ SOLN
INTRAMUSCULAR | Status: DC | PRN
  Administered 2019-01-01 (×2): 10 mL via EPIDURAL

## 2019-01-01 MED ORDER — OXYCODONE-ACETAMINOPHEN 5-325 MG PO TABS: 2.0000 | ORAL_TABLET | ORAL | Status: DC | PRN

## 2019-01-01 MED ORDER — LACTATED RINGERS IV SOLN
INTRAVENOUS | Status: DC
  Administered 2019-01-01 – 2019-01-02 (×2): via INTRAVENOUS
  Filled 2019-01-01 (×4): qty 1000

## 2019-01-01 MED ORDER — NALBUPHINE HCL 10 MG/ML IJ SOLN
5.0000 mg | INTRAMUSCULAR | Status: DC | PRN
  Administered 2019-01-01: 5 mg via INTRAVENOUS
  Filled 2019-01-01 (×3): qty 0.5

## 2019-01-01 MED ORDER — SODIUM CHLORIDE 0.9% FLUSH: 3.0000 mL | INTRAVENOUS | Status: DC | PRN

## 2019-01-01 MED ORDER — NALOXONE HCL 4 MG/10ML IJ SOLN
1.0000 ug/kg/h | INTRAVENOUS | Status: DC | PRN
  Filled 2019-01-01: qty 5

## 2019-01-01 MED ORDER — KETOROLAC TROMETHAMINE 30 MG/ML IJ SOLN
30.0000 mg | Freq: Once | INTRAMUSCULAR | Status: AC
  Administered 2019-01-01: 20:00:00 30 mg via INTRAVENOUS
  Filled 2019-01-01: qty 1

## 2019-01-01 MED ORDER — ONDANSETRON HCL 4 MG/2ML IJ SOLN
4.0000 mg | Freq: Three times a day (TID) | INTRAMUSCULAR | Status: DC | PRN
  Administered 2019-01-01: 18:00:00 4 mg via INTRAVENOUS

## 2019-01-01 MED ORDER — BUPIVACAINE HCL (PF) 0.5 % IJ SOLN
INTRAMUSCULAR | Status: DC | PRN
  Administered 2019-01-01: 15 mL

## 2019-01-01 MED ORDER — SODIUM CHLORIDE 0.9 % IV SOLN
INTRAVENOUS | Status: DC | PRN
  Administered 2019-01-01: 40 [IU] via INTRAVENOUS

## 2019-01-01 MED ORDER — LIDOCAINE HCL (PF) 1 % IJ SOLN
INTRAMUSCULAR | Status: DC | PRN
  Administered 2019-01-01 (×2): 4 mL via EPIDURAL

## 2019-01-01 MED ORDER — DIPHENHYDRAMINE HCL 25 MG PO CAPS
25.0000 mg | ORAL_CAPSULE | Freq: Four times a day (QID) | ORAL | Status: DC | PRN
  Administered 2019-01-02: 21:00:00 25 mg via ORAL
  Filled 2019-01-01: qty 1

## 2019-01-01 MED ORDER — LACTATED RINGERS IV SOLN
INTRAVENOUS | Status: DC
  Administered 2019-01-01: 10:00:00 via INTRAVENOUS

## 2019-01-01 MED ORDER — ACETAMINOPHEN 325 MG PO TABS
650.0000 mg | ORAL_TABLET | ORAL | Status: DC | PRN
  Administered 2019-01-02 – 2019-01-04 (×8): 650 mg via ORAL
  Filled 2019-01-01 (×8): qty 2

## 2019-01-01 MED ORDER — LIDOCAINE-EPINEPHRINE (PF) 2 %-1:200000 IJ SOLN
INTRAMUSCULAR | Status: AC
  Filled 2019-01-01: qty 10

## 2019-01-01 MED ORDER — FENTANYL CITRATE (PF) 100 MCG/2ML IJ SOLN
INTRAMUSCULAR | Status: DC | PRN
  Administered 2019-01-01: 100 ug via INTRAVENOUS

## 2019-01-01 MED ORDER — EPHEDRINE 5 MG/ML INJ: 10.0000 mg | INTRAVENOUS | Status: DC | PRN

## 2019-01-01 MED ORDER — NALBUPHINE HCL 10 MG/ML IJ SOLN
5.0000 mg | Freq: Once | INTRAMUSCULAR | Status: DC | PRN
  Filled 2019-01-01: qty 0.5

## 2019-01-01 MED ORDER — MORPHINE SULFATE (PF) 0.5 MG/ML IJ SOLN
INTRAMUSCULAR | Status: DC | PRN
  Administered 2019-01-01: 3 mg via EPIDURAL

## 2019-01-01 MED ORDER — SOD CITRATE-CITRIC ACID 500-334 MG/5ML PO SOLN
30.0000 mL | ORAL | Status: DC | PRN
  Filled 2019-01-01: qty 30

## 2019-01-01 MED ORDER — CEFAZOLIN SODIUM-DEXTROSE 2-3 GM-%(50ML) IV SOLR
INTRAVENOUS | Status: DC | PRN
  Administered 2019-01-01: 2 g via INTRAVENOUS

## 2019-01-01 MED ORDER — CEFAZOLIN SODIUM-DEXTROSE 2-4 GM/100ML-% IV SOLN
INTRAVENOUS | Status: AC
  Filled 2019-01-01: qty 100

## 2019-01-01 MED ORDER — SODIUM CHLORIDE 0.9 % IV SOLN
500.0000 mg | Freq: Once | INTRAVENOUS | Status: AC
  Administered 2019-01-01: 17:00:00 500 mg via INTRAVENOUS
  Filled 2019-01-01: qty 500

## 2019-01-01 MED ORDER — CEFAZOLIN SODIUM-DEXTROSE 2-4 GM/100ML-% IV SOLN: 2.0000 g | Freq: Once | INTRAVENOUS | Status: DC

## 2019-01-01 MED ORDER — SODIUM CHLORIDE (PF) 0.9 % IJ SOLN
INTRAMUSCULAR | Status: DC | PRN
  Administered 2019-01-01: 12 mL/h via EPIDURAL

## 2019-01-01 MED ORDER — FENTANYL CITRATE (PF) 100 MCG/2ML IJ SOLN
100.0000 ug | INTRAMUSCULAR | Status: DC | PRN
  Administered 2019-01-01 (×2): 100 ug via INTRAVENOUS
  Filled 2019-01-01: qty 2

## 2019-01-01 MED ORDER — DEXAMETHASONE SODIUM PHOSPHATE 4 MG/ML IJ SOLN
INTRAMUSCULAR | Status: AC
  Filled 2019-01-01: qty 1

## 2019-01-01 MED ORDER — SIMETHICONE 80 MG PO CHEW
80.0000 mg | CHEWABLE_TABLET | Freq: Three times a day (TID) | ORAL | Status: DC
  Administered 2019-01-02 – 2019-01-04 (×7): 80 mg via ORAL
  Filled 2019-01-01 (×8): qty 1

## 2019-01-01 MED ORDER — BETAMETHASONE SOD PHOS & ACET 6 (3-3) MG/ML IJ SUSP
12.0000 mg | Freq: Once | INTRAMUSCULAR | Status: AC
  Administered 2019-01-01: 12:00:00 12 mg via INTRAMUSCULAR
  Filled 2019-01-01: qty 2

## 2019-01-01 MED ORDER — SCOPOLAMINE 1 MG/3DAYS TD PT72
MEDICATED_PATCH | TRANSDERMAL | Status: DC | PRN
  Administered 2019-01-01: 1 via TRANSDERMAL

## 2019-01-01 MED ORDER — LACTATED RINGERS IV SOLN: 500.0000 mL | Freq: Once | INTRAVENOUS | Status: DC

## 2019-01-01 MED ORDER — DIPHENHYDRAMINE HCL 25 MG PO CAPS: 25.0000 mg | ORAL_CAPSULE | ORAL | Status: DC | PRN

## 2019-01-01 MED ORDER — SIMETHICONE 80 MG PO CHEW
80.0000 mg | CHEWABLE_TABLET | ORAL | Status: DC | PRN
  Filled 2019-01-01: qty 1

## 2019-01-01 MED ORDER — MEPERIDINE HCL 25 MG/ML IJ SOLN: 6.2500 mg | INTRAMUSCULAR | Status: DC | PRN

## 2019-01-01 MED ORDER — OXYTOCIN BOLUS FROM INFUSION: 500.0000 mL | Freq: Once | INTRAVENOUS | Status: DC

## 2019-01-01 MED ORDER — ZOLPIDEM TARTRATE 5 MG PO TABS: 5.0000 mg | ORAL_TABLET | Freq: Every evening | ORAL | Status: DC | PRN

## 2019-01-01 SURGICAL SUPPLY — 39 items
BENZOIN TINCTURE PRP APPL 2/3 (GAUZE/BANDAGES/DRESSINGS) IMPLANT
CHLORAPREP W/TINT 26ML (MISCELLANEOUS) ×3 IMPLANT
CLAMP CORD UMBIL (MISCELLANEOUS) IMPLANT
CLOSURE WOUND 1/2 X4 (GAUZE/BANDAGES/DRESSINGS)
CLOTH BEACON ORANGE TIMEOUT ST (SAFETY) ×3 IMPLANT
DRSG OPSITE POSTOP 4X10 (GAUZE/BANDAGES/DRESSINGS) ×3 IMPLANT
ELECT REM PT RETURN 9FT ADLT (ELECTROSURGICAL) ×3
ELECTRODE REM PT RTRN 9FT ADLT (ELECTROSURGICAL) ×1 IMPLANT
EXTRACTOR VACUUM BELL STYLE (SUCTIONS) IMPLANT
GAUZE SPONGE 4X4 12PLY STRL LF (GAUZE/BANDAGES/DRESSINGS) ×9 IMPLANT
GLOVE BIOGEL PI IND STRL 6.5 (GLOVE) ×1 IMPLANT
GLOVE BIOGEL PI IND STRL 7.0 (GLOVE) ×2 IMPLANT
GLOVE BIOGEL PI INDICATOR 6.5 (GLOVE) ×2
GLOVE BIOGEL PI INDICATOR 7.0 (GLOVE) ×4
GLOVE ORTHOPEDIC STR SZ6.5 (GLOVE) ×3 IMPLANT
GOWN STRL REUS W/TWL LRG LVL3 (GOWN DISPOSABLE) ×9 IMPLANT
HEMOSTAT ARISTA ABSORB 3G PWDR (HEMOSTASIS) ×3 IMPLANT
KIT ABG SYR 3ML LUER SLIP (SYRINGE) IMPLANT
NEEDLE HYPO 22GX1.5 SAFETY (NEEDLE) ×3 IMPLANT
NEEDLE HYPO 25X1 1.5 SAFETY (NEEDLE) IMPLANT
NS IRRIG 1000ML POUR BTL (IV SOLUTION) ×3 IMPLANT
PACK C SECTION WH (CUSTOM PROCEDURE TRAY) ×3 IMPLANT
PAD ABD 7.5X8 STRL (GAUZE/BANDAGES/DRESSINGS) ×3 IMPLANT
PAD OB MATERNITY 4.3X12.25 (PERSONAL CARE ITEMS) ×3 IMPLANT
PENCIL SMOKE EVAC W/HOLSTER (ELECTROSURGICAL) ×3 IMPLANT
STRIP CLOSURE SKIN 1/2X4 (GAUZE/BANDAGES/DRESSINGS) IMPLANT
SUT MNCRL AB 4-0 PS2 18 (SUTURE) ×3 IMPLANT
SUT MON AB 4-0 PS1 27 (SUTURE) ×3 IMPLANT
SUT PLAIN 2 0 (SUTURE) ×2
SUT PLAIN ABS 2-0 CT1 27XMFL (SUTURE) ×1 IMPLANT
SUT VIC AB 0 CT1 36 (SUTURE) ×6 IMPLANT
SUT VIC AB 0 CTX 36 (SUTURE) ×4
SUT VIC AB 0 CTX36XBRD ANBCTRL (SUTURE) ×2 IMPLANT
SUT VIC AB 2-0 CT1 27 (SUTURE) ×2
SUT VIC AB 2-0 CT1 TAPERPNT 27 (SUTURE) ×1 IMPLANT
SYR CONTROL 10ML LL (SYRINGE) ×3 IMPLANT
TOWEL OR 17X24 6PK STRL BLUE (TOWEL DISPOSABLE) ×3 IMPLANT
TRAY FOLEY W/BAG SLVR 14FR LF (SET/KITS/TRAYS/PACK) ×3 IMPLANT
WATER STERILE IRR 1000ML POUR (IV SOLUTION) ×3 IMPLANT

## 2019-01-01 NOTE — Progress Notes (Signed)
At bedside d/t recurrent variables. Recent epidural placement but BP stable. FHR 140s with variables down to 50-60s. FSE/IUPC applied. SVE: 8/100/0, moderate amt dark bloody fluid. Dr. Rosana Hoes called to bedside. To OR for Code CS.

## 2019-01-01 NOTE — Anesthesia Preprocedure Evaluation (Signed)
Anesthesia Evaluation  Patient identified by MRN, date of birth, ID band Patient awake    Reviewed: Allergy & Precautions, NPO status , Patient's Chart, lab work & pertinent test results  Airway Mallampati: II  TM Distance: >3 FB Neck ROM: Full    Dental no notable dental hx. (+) Dental Advisory Given   Pulmonary neg pulmonary ROS, former smoker,    Pulmonary exam normal        Cardiovascular negative cardio ROS Normal cardiovascular exam     Neuro/Psych PSYCHIATRIC DISORDERS Anxiety negative neurological ROS  negative psych ROS   GI/Hepatic negative GI ROS, Neg liver ROS,   Endo/Other  negative endocrine ROS  Renal/GU negative Renal ROS  negative genitourinary   Musculoskeletal negative musculoskeletal ROS (+)   Abdominal   Peds negative pediatric ROS (+)  Hematology negative hematology ROS (+)   Anesthesia Other Findings   Reproductive/Obstetrics (+) Pregnancy                             Anesthesia Physical Anesthesia Plan  ASA: II  Anesthesia Plan: Epidural   Post-op Pain Management:    Induction:   PONV Risk Score and Plan:   Airway Management Planned: Natural Airway  Additional Equipment:   Intra-op Plan:   Post-operative Plan:   Informed Consent:   Plan Discussed with: Anesthesiologist  Anesthesia Plan Comments:         Anesthesia Quick Evaluation

## 2019-01-01 NOTE — Lactation Note (Addendum)
This note was copied from a baby's chart. Lactation Consultation Note  Patient Name: Whitney Lowe Today's Date: 01/01/2019 Reason for consult: Initial assessment;1st time breastfeeding;NICU baby;Preterm <34wks P1, 5 hour female infant, NICU  in couplet care, premature infant born at 79 weeks 1 day. Per mom, she attended [place] [facility] in her pregnancy. Mom has colostrum present and taught back hand expression a few drops of colostrum expressed  and will be placed on infant's lips by Nurse. Mom will massage breast while pumping and do hand expressions to help with breast stimulation..  Mom shown how to use DEBP & how to disassemble, clean, & reassemble parts. Mom will use DEBP every 3 hours for 15 minutes on initial setting. Dad understands how to clean pump parts. Mom will call Nurse or LC if she has any questions or concerns. Mom made aware of O/P services, breastfeeding support groups, community resources, and our phone # for post-discharge questions.   Maternal Data Formula Feeding for Exclusion: No Has patient been taught Hand Expression?: Yes(mom taught back hand expression and colostrum is present.) Does the patient have breastfeeding experience prior to this delivery?: No  Feeding    LATCH Score                   Interventions Interventions: Breast feeding basics reviewed;Skin to skin;Hand express;Expressed milk;DEBP  Lactation Tools Discussed/Used WIC Program: No Pump Review: Setup, frequency, and cleaning;Milk Storage Initiated by:: Vicente Serene, IBCLC Date initiated:: 01/01/19   Consult Status Consult Status: Follow-up Date: 01/02/19 Follow-up type: In-patient    Vicente Serene 01/01/2019, 9:18 PM

## 2019-01-01 NOTE — Op Note (Addendum)
[name] PROCEDURE DATE: 01/01/2019  PREOPERATIVE DIAGNOSES: Intrauterine pregnancy at 35w1dweeks gestation; PPROM; non-reassuring fetal status, suspected placental abruption  POSTOPERATIVE DIAGNOSES: the same, large placental abruption  PROCEDURE: Primary Low Transverse Cesarean Section  SURGEON:  KFeliz Beam MD  ASSISTANT:  none  ANESTHESIOLOGY TEAM: Anesthesiologist: SDuane Boston MD CRNA: MHewitt Blade CRNA; RRayvon Char CRNA; Sterling, CSheron Nightingale CRNA  INDICATIONS: Whitney Wareingis a 33y.o. G3P0020 at 310w1dere for cesarean section secondary to the indications listed under preoperative diagnoses; please see preoperative note for further details.  The risks of cesarean section were discussed with the patient including but were not limited to: bleeding which may require transfusion or reoperation; infection which may require antibiotics; injury to bowel, bladder, ureters or other surrounding organs; injury to the fetus; need for additional procedures including hysterectomy in the event of a life-threatening hemorrhage; placental abnormalities wth subsequent pregnancies, incisional problems, thromboembolic phenomenon and other postoperative/anesthesia complications.  She consents to blood transfusion in the event of an emergency. The patient verbalized understanding of the plan, giving informed written consent for the procedure.    FINDINGS:  Viable female infant in cephalic presentation.  Apgars 6 and 9. Weight: 1720. Clear amniotic fluid.  Large amount of dark red clots behind placenta, placenta intact, three vessel cord.  Normal uterus, fallopian tubes and ovaries bilaterally.  ANESTHESIA: Epidural INTRAVENOUS FLUIDS: 1300 ml   ESTIMATED BLOOD LOSS: 325 ml URINE OUTPUT:  200 ml SPECIMENS: Placenta sent to pathology COMPLICATIONS: None immediate  PROCEDURE IN DETAIL:  The patient was taken to the OR emergently, already had sequential compression devices  applied to her lower extremities. The epidural anesthesia was dosed up to surgical level. She was then placed in a dorsal supine position with a leftward tilt.  A foley catheter was placed into her bladder with sterile technique and attached to constant gravity. She was prepped with betadine and draped in a sterile manner. She was given IV antibiotics for surgical prophylaxis. Anesthesia was tested and found to be adequate. After a timeout was performed, a Pfannenstiel skin incision was made with scalpel and carried through to the underlying layer of fascia. The fascia was incised in the midline, and this incision was extended bilaterally bluntly. The rectus muscles were separated bluntly and the peritoneum was entered bluntly. The peritoneal incision was extended bluntly and caudad with good visualization of the bladder. The uterus appeared normal.  The bladder blade was inserted. Attention was turned to the lower uterine segment where a low transverse hysterotomy was made with a scalpel and extended bilaterally bluntly.  The infant was delivered from LOA position, cord immediately clamped and and infant handed to waiting NICU team. Cord gases obtained. Uterine massage was then performed, and the placenta delivered intact with a three-vessel cord. The Alexis O-ring retractor was placed into the incision, taking care not to incorporate bowel or omentum.The uterus was then cleared of clots and debris.  The hysterotomy was closed with 0 Vicryl in a running locked fashion, and an imbricating layer was also placed with 0 Vicryl. The fallopian tubes and ovaries were visualized bilaterally and normal appearing. The Alexis retractor was removed.  The pelvis was cleared of all clot and debris. Arista was placed over the hysterotomy for additional hemostasis.  Hemostasis was again confirmed on all surfaces. The peritoneum was re-approximated using 2-0 Vicryl suture. The fascia was then closed using 0 Vicryl in a running  fashion.  The subcutaneous layer was irrigated, then reapproximated  with 2-0 plain gut, and 15 ml of 0.5% Marcaine was injected subcutaneously around the incision.  The skin was closed with a 4-0 Monocryl subcuticular stitch. The patient tolerated the procedure well. Sponge, lap, instrument and needle counts were correct x 3.  She was taken to the recovery room in stable condition.    Feliz Beam, M.D. Attending [place], [facility] for [employer], [place]

## 2019-01-01 NOTE — H&P (Addendum)
OBSTETRIC ADMISSION HISTORY AND PHYSICAL  Whitney Lowe is a 33 y.o. female G3P0020 with IUP at 62w1dtransferred from antepartum for increasing back and abdominal pains concerning for contractions. She has been PPROM for 5 weeks on antepartum since 27.0 weeks. She has had initial course of BMZ and Magnesium Sulfate. Reports increasing mucous and pink-tinged discharge. She reports +FMs. No blurry vision, headaches, peripheral edema, or RUQ pain. Please    Dating: by early [place]--->  Estimated Date of Delivery: 02/25/19  Sono: @[redacted]w[redacted]d , CWD, normal anatomy, cephalic presentation, EFW 3'10 24 %ile BPP 6/8, Fetal breathing did not meet criteria  Prenatal History/Complications: Hx of 2x miscarriage Pregnancy complicated by Crohn's, fibroids Antepartum placental abruption at 19 wks  Past Medical History: Past Medical History:  Diagnosis Date  . ADHD    stopped abilify with preg  . ADHD (attention deficit hyperactivity disorder)   . Anemia   . Anxiety    not currently on meds  . Cataract    Rt eye  . Complication of anesthesia    became combative   . Crohn's ileocolitis ([place] 06/19/2016  . Eczema   . Fibroids   . Infection    UTI  . Ovarian cyst     Past Surgical History: Past Surgical History:  Procedure Laterality Date  . COLONOSCOPY    . ESOPHAGOGASTRODUODENOSCOPY    . removal of cyst in ear     6th grade    Obstetrical History: OB History    Gravida  3   Para      Term      Preterm      AB  2   Living        SAB  1   TAB  1   Ectopic      Multiple      Live Births              Social History: Social History   Socioeconomic History  . Marital status: Married    Spouse name: Marqus Balthazor  . Number of children: 0  . Years of education: Not on file  . Highest education level: Not on file  Occupational History  . Occupation: TPharmacist, hospital   Comment: ESL  Social Needs  . Financial resource strain: Not on file  . Food insecurity    Worry: Never true     Inability: Never true  . Transportation needs    Medical: No    Non-medical: No  Tobacco Use  . Smoking status: Former Smoker    Types: Cigars, Cigarettes    Quit date: 04/21/2018    Years since quitting: 0.6  . Smokeless tobacco: Never Used  . Tobacco comment: social smoker  Substance and Sexual Activity  . Alcohol use: Not Currently    Frequency: Never    Comment: socially when not pregnant  . Drug use: Not Currently    Types: Marijuana    Comment: last use february 2020-   . Sexual activity: Not Currently    Birth control/protection: None  Lifestyle  . Physical activity    Days per week: Not on file    Minutes per session: Not on file  . Stress: Not on file  Relationships  . Social [place] phone: Not on file    Gets together: Not on file    Attends religious service: Not on file    Active member of club or organization: Not on file    Attends meetings  of clubs or organizations: Not on file    Relationship status: Not on file  Other Topics Concern  . Not on file  Social History Narrative   Lives w/ fiance   Pregnant    BS psychology   [employer]    Family History: Family History  Problem Relation Age of Onset  . Crohn's disease Mother        mom was adopted  . Heart disease Mother   . Anxiety disorder Mother   . Blindness Mother        Rt eye  . Diabetes Mellitus II Father   . COPD Father   . Hypertension Father   . Asthma Father   . ADD / ADHD Father   . Diabetes Father        type 2  . Anxiety disorder Father   . Depression Father   . Diabetes Sister        Type 2  . Colon cancer Neg Hx   . Esophageal cancer Neg Hx   . Pancreatic cancer Neg Hx   . Stomach cancer Neg Hx   . Liver disease Neg Hx     Allergies: No Known Allergies  Medications Prior to Admission  Medication Sig Dispense Refill Last Dose  . Prenatal MV-Min-FA-Omega-3 (PRENATAL GUMMIES/DHA & FA PO) Take 2 each by mouth daily.   11/25/2018 at Unknown time   . phenylephrine (,USE FOR PREPARATION-H,) 0.25 % suppository Place 1 suppository rectally 2 (two) times daily as needed for hemorrhoids.     Delsa Grana 90 MG/ML SOSY injection ADMINISTER 1 ML(90 MG) UNDER THE SKIN EVERY 8 WEEKS (Patient taking differently: Inject 90 mg into the skin once. ) 1 mL 2 11/23/2018     Review of Systems   All systems reviewed and negative except as stated in HPI  Blood pressure (!) 93/49, pulse (!) 110, temperature 97.9 F (36.6 C), temperature source Oral, resp. rate 18, height 4' 10"  (1.473 m), weight 56.2 kg, last menstrual period 05/21/2018, SpO2 98 %. General appearance: alert, cooperative, appears stated age and no distress Lungs: regular rate and effort Heart: regular rate  Abdomen: soft, non-tender Extremities: Homans sign is negative, no sign of DVT Presentation: cephalic Fetal monitoringBaseline: 145 bpm, Variability: Good {> 6 bpm), Accelerations: Reactive and Decelerations: Absent Uterine activityFrequency: uterine irritability   Prenatal labs: ABO, Rh: --/--/B NEG (09/11 1110) Antibody: POS (09/11 1110) Rubella: <0.90 (05/14 1651) RPR: Non Reactive (08/12 1226)  HBsAg: Negative (05/14 1651)  HIV: Non Reactive (05/14 1651)  GBS:   negative 2 hr GTT normal  Prenatal Transfer Tool  Maternal Diabetes: No Genetic Screening: Normal Maternal Ultrasounds/Referrals: Other: Fetal Ultrasounds or other Referrals:  Fetal echo Maternal Substance Abuse:  No Significant Maternal Medications:  Stelara Significant Maternal Lab Results: Group B Strep negative  Results for orders placed or performed during the hospital encounter of 11/25/18 (from the past 24 hour(s))  CBC with Differential/Platelet   Collection Time: 12/31/18 11:10 AM  Result Value Ref Range   WBC 11.1 (H) 4.0 - 10.5 K/uL   RBC 3.68 (L) 3.87 - 5.11 MIL/uL   Hemoglobin 11.1 (L) 12.0 - 15.0 g/dL   HCT 33.3 (L) 36.0 - 46.0 %   MCV 90.5 80.0 - 100.0 fL   MCH 30.2 26.0 - 34.0 pg   MCHC  33.3 30.0 - 36.0 g/dL   RDW 13.7 11.5 - 15.5 %   Platelets 321 150 - 400 K/uL   nRBC 0.0 0.0 -  0.2 %   Neutrophils Relative % 75 %   Neutro Abs 8.3 (H) 1.7 - 7.7 K/uL   Lymphocytes Relative 17 %   Lymphs Abs 1.9 0.7 - 4.0 K/uL   Monocytes Relative 6 %   Monocytes Absolute 0.6 0.1 - 1.0 K/uL   Eosinophils Relative 1 %   Eosinophils Absolute 0.1 0.0 - 0.5 K/uL   Basophils Relative 0 %   Basophils Absolute 0.0 0.0 - 0.1 K/uL   Immature Granulocytes 1 %   Abs Immature Granulocytes 0.14 (H) 0.00 - 0.07 K/uL  Type and screen   Collection Time: 12/31/18 11:10 AM  Result Value Ref Range   ABO/RH(D) B NEG    Antibody Screen POS    Sample Expiration 01/03/2019,2359    Antibody Identification PASSIVELY ACQUIRED ANTI-D    Unit Number [id number]    Blood Component Type RED CELLS,LR    Unit division 00    Status of Unit ALLOCATED    Transfusion Status OK TO TRANSFUSE    Crossmatch Result COMPATIBLE    Unit Number [id number]    Blood Component Type RED CELLS,LR    Unit division 00    Status of Unit ALLOCATED    Transfusion Status OK TO TRANSFUSE    Crossmatch Result COMPATIBLE   BPAM RBC   Collection Time: 12/31/18 11:10 AM  Result Value Ref Range   Blood Product Unit Number [id number]    PRODUCT CODE [id number]    Unit Type and Rh 1700    Blood Product Expiration Date 678938101751    ISSUE DATE / TIME [id number]    Blood Product Unit Number [id number]    PRODUCT CODE [id number]    Unit Type and Rh 1700    Blood Product Expiration Date 932671245809     Patient Active Problem List   Diagnosis Date Noted  . Tachycardia 12/01/2018  . Preterm premature rupture of membranes (PPROM), second trimester, antepartum 11/25/2018  . Antepartum placental abruption 10/05/2018  . Cervical mass 10/04/2018  . Fibroid uterus 10/04/2018  . Anemia affecting pregnancy, antepartum 09/06/2018  . Rubella non-immune status, antepartum 09/03/2018  . Rh negative, antepartum 09/03/2018  .  Supervision of high risk pregnancy, antepartum 08/17/2018  . Long-term use of immunosuppressant medication-Stelara 07/21/2018  . Crohn's ileocolitis ([place]) 06/19/2016    Assessment and Plan : Carri Spillers is a 33 y.o. G3P0020 at 58w1dhere for PPROM at   1. PPROM with uterine Cx and change in cervical fluid at 32 weeks: Will augment labor with oral cytotec given PPROM state. Offered FB, but patient declined initially, will re-evaluate. Once appropriate will start pitocin. Vertex presentation again confirmed by bedside [place] Patient is a candidate for a 1x rescue BMZ dose for fetal lung maturity  2. FWB: Cat 1 3. Pain: well controlled at this time, epidural if needed 4. GBS: negative on 11/25/2018 however given length of time of PPROM and gestational age of fetus. Will given PCN prophylaxis 5. Rh negative, will evaluate for need of rhogam after delivery 6. Rubella non-immune: MMR  PNikki DomDriver, MD  99/83/3825 9:48 AM  Midwife attestation: I have seen and examined this patient; I agree with above documentation in the resident's note.   PE: Gen: calm comfortable, NAD Resp: normal effort and rate Abd: gravid  ROS, labs, PMH reviewed  Assessment/Plan: STata Timminsis a 33y.o. G3P0020 here for IOL for PPROM, worsening abd pain Admit to LD Labor: latent FWB: Cat I GBS neg> will repeat Admit to LD IOL  BMZ rescue dose PCN for prophylaxis (<37 wks)  Julianne Handler, CNM  01/01/2019, 12:16 PM

## 2019-01-01 NOTE — Progress Notes (Signed)
Call to Dr. Elonda Husky re: pt's increase in pain/ what pt. Describes as 9/10 lower back pain. Asked to place patient on monitor at this time however she claimed she was too uncomfortable. Pain meds given at 8:11 and pt. Agreed to monitor shortly after.  Call to NICU charge to make aware of pt's transfer to L/D and report called to [facility] on L/D.

## 2019-01-01 NOTE — Transfer of Care (Signed)
Immediate Anesthesia Transfer of Care Note  Patient: Whitney Lowe  Procedure(s) Performed: CESAREAN SECTION (N/A )  Patient Location: PACU  Anesthesia Type:Spinal and Epidural  Level of Consciousness: awake, alert  and oriented  Airway & Oxygen Therapy: Patient Spontanous Breathing  Post-op Assessment: Report given to RN and Post -op Vital signs reviewed and stable  Post vital signs: Reviewed and stable  Last Vitals:  Vitals Value Taken Time  BP    Temp    Pulse 109 01/01/19 1652  Resp 21 01/01/19 1652  SpO2 99 % 01/01/19 1652  Vitals shown include unvalidated device data.  Last Pain:  Vitals:   01/01/19 1450  TempSrc:   PainSc: 10-Worst pain ever      Patients Stated Pain Goal: 2 (86/76/72 0947)  Complications: No apparent anesthesia complications

## 2019-01-01 NOTE — Progress Notes (Signed)
Late Entry Note  Called to room for FHR in 29s. Patient found to be 80/100/0 with brief recovery to 140s, decel to 50s again despite interventions. Recommended proceeding with emergent c-section, briefly reviewed risks/benefits of c-section with patient, she is agreeable. Code cesarean called. To OR emergently.    Feliz Beam, M.D. Attending Center for [employer] Fish farm manager)

## 2019-01-01 NOTE — Progress Notes (Signed)
Labor Progress Note Gratia Disla is a 33 y.o. G3P0020 at 48w1dpresented for IOL for PPPROM  S:  Feeling pelvic pressure with ctx. Had recent dose of Fentanyl.  O:  BP 98/62   Pulse 80   Temp 98.5 F (36.9 C) (Oral)   Resp 14   Ht 4' 10"  (1.473 m)   Wt 56.2 kg   LMP 05/21/2018 (Exact Date)   SpO2 98%   BMI 25.92 kg/m  EFM: baseline 145 bpm/ mod variability/ no accels/ occ variable decels  Toco: 3-5 SVE: Dilation: 5 Effacement (%): 90 Station: -1 Presentation: Vertex Exam by:: MColman Cater CNM  A/P: 33y.o. G3P0020 314w1d1. Labor: early active phase 2. FWB: Cat II 3. Pain: analgesia/anesthesia prn  Anticipate labor progress and SVD.  MeJulianne HandlerCNM 2:46 PM

## 2019-01-01 NOTE — Anesthesia Procedure Notes (Signed)
Epidural Patient location during procedure: OB Start time: 01/01/2019 3:08 PM End time: 01/01/2019 3:21 PM  Staffing Anesthesiologist: Duane Boston, MD Performed: anesthesiologist   Preanesthetic Checklist Completed: patient identified, site marked, pre-op evaluation, timeout performed, IV checked, risks and benefits discussed and monitors and equipment checked  Epidural Patient position: sitting Prep: DuraPrep Patient monitoring: heart rate, cardiac monitor, continuous pulse ox and blood pressure Approach: midline Location: L2-L3 Injection technique: LOR saline  Needle:  Needle type: Tuohy  Needle gauge: 17 G Needle length: 9 cm Needle insertion depth: 5 cm Catheter size: 20 Guage Catheter at skin depth: 10 cm Test dose: negative and Other  Assessment Events: blood not aspirated, injection not painful, no injection resistance and negative IV test  Additional Notes Informed consent obtained prior to proceeding including risk of failure, 1% risk of PDPH, risk of minor discomfort and bruising.  Discussed rare but serious complications including epidural abscess, permanent nerve injury, epidural hematoma.  Discussed alternatives to epidural analgesia and patient desires to proceed.  Timeout performed pre-procedure verifying patient name, procedure, and platelet count.  Patient tolerated procedure well.

## 2019-01-01 NOTE — Discharge Summary (Signed)
Postpartum Discharge Summary     Patient Name: Whitney Lowe DOB: 1985/06/17 MRN: [id number]  Date of admission: 11/25/2018 Delivering Provider: Sloan Lowe   Date of discharge: 01/04/2019  Admitting diagnosis: 27 WKS, LEAKING FLUIDS Intrauterine pregnancy: [redacted]w[redacted]d    Secondary diagnosis:  Principal Problem:   Preterm premature rupture of membranes (PPROM), second trimester, antepartum Active Problems:   Crohn's ileocolitis ([place]   Long-term use of immunosuppressant medication-Stelara   Supervision of high risk pregnancy, antepartum   Rubella non-immune status, antepartum   Rh negative, antepartum   Anemia affecting pregnancy, antepartum   Cervical mass   Fibroid uterus   Antepartum placental abruption   Tachycardia   Placental abruption  Additional problems: None     Discharge diagnosis: Preterm Pregnancy Delivered                                                                                                Post partum procedures:None  Augmentation: Cytotec  Complications: Placental Abruption  Hospital course:  Induction of Labor With Cesarean Section  33y.o. yo G3P0020 at 335w1das admitted to the hospital 11/25/2018 for PPROM at 2725w0dhe received antenatal corticosteroids, magnesium for CP prophylaxis, latency antibiotics. She was stable until this week when she started having some small vaginal clots and some decreased variability in FHT. Decision made this am to induce as she had significant abdominal pain, which was a definite change in status for her. She was brought to L&D for induction of labor. She had one cytotec and progressed, however started having variability decels and then prolonged fetal bradycardia to 50s. She was 8/100/0 at that time and recommended for emergency c-section. The patient went for cesarean section due to Non-Reassuring FHR, and delivered a Viable infant,01/01/2019  Membrane Rupture Time/Date: 10:36 PM ,11/25/2018   Details of operation can be  found in separate operative Note.  Patient had an uncomplicated postpartum course. She is ambulating, tolerating a regular diet, passing flatus, and urinating well.  Patient is discharged home in stable condition on 01/04/19.                                   Delivery time: 4:00 PM    Magnesium Sulfate received: Yes BMZ received: Yes Rhophylac:Yes MMR:Yes Transfusion:No  Physical exam  Vitals:   01/03/19 0850 01/03/19 1815 01/03/19 2025 01/04/19 0611  BP:  97/67 92/63 106/80  Pulse:  97 94 87  Resp:  20 20 18   Temp:  98.3 F (36.8 C) 98 F (36.7 C) 98.2 F (36.8 C)  TempSrc:  Oral Oral Oral  SpO2: 98%     Weight:      Height:       General: alert, cooperative and no distress Lochia: appropriate Uterine Fundus: firm Incision: Healing well with no significant drainage DVT Evaluation: No evidence of DVT seen on physical exam. Labs: Lab Results  Component Value Date   WBC 16.0 (H) 01/02/2019   HGB 9.5 (L) 01/02/2019   HCT 27.6 (L) 01/02/2019  MCV 90.2 01/02/2019   PLT 264 01/02/2019   CMP Latest Ref Rng & Units 01/02/2019  Glucose 70 - 99 mg/dL -  BUN 6 - 20 mg/dL -  Creatinine 0.44 - 1.00 mg/dL 0.62  Sodium 135 - 145 mmol/L -  Potassium 3.5 - 5.1 mmol/L -  Chloride 98 - 111 mmol/L -  CO2 22 - 32 mmol/L -  Calcium 8.9 - 10.3 mg/dL -  Total Protein 6.5 - 8.1 g/dL -  Total Bilirubin 0.3 - 1.2 mg/dL -  Alkaline Phos 38 - 126 U/L -  AST 15 - 41 U/L -  ALT 0 - 44 U/L -    Discharge instruction: per After Visit Summary and "Baby and Me Booklet".  After visit meds:  Allergies as of 01/04/2019   No Known Allergies     Medication List    TAKE these medications   ibuprofen 800 MG tablet Commonly known as: ADVIL Take 1 tablet (800 mg total) by mouth every 8 (eight) hours as needed.   oxyCODONE 5 MG immediate release tablet Commonly known as: Roxicodone Take 1 tablet (5 mg total) by mouth every 4 (four) hours as needed for up to 5 days for severe pain.    phenylephrine 0.25 % suppository Commonly known as: (USE for PREPARATION-H) Place 1 suppository rectally 2 (two) times daily as needed for hemorrhoids.   PRENATAL GUMMIES/DHA & FA PO Take 2 each by mouth daily.   Stelara 90 MG/ML Sosy injection Generic drug: ustekinumab ADMINISTER 1 ML(90 MG) UNDER THE SKIN EVERY 8 WEEKS What changed: See the new instructions.       Diet: routine diet  Activity: Advance as tolerated. Pelvic rest for 6 weeks.   Outpatient follow up:6 weeks Follow up Appt:No future appointments. Follow up Visit:   Please schedule this patient for Postpartum visit in: 4 weeks with the following provider: MD For C/S patients schedule nurse incision check in weeks 2 weeks: yes High risk pregnancy complicated by: PPROM Delivery mode:  CS Anticipated Birth Control:  other/unsure PP Procedures needed: Incision check  Schedule Integrated BH visit: yes, traumatic delivery    Newborn Data: Live born female  Birth Weight: 3 lb 12.7 oz (1720 g) APGAR: 6, 9  Newborn Delivery   Birth date/time: 01/01/2019 16:00:00 Delivery type: C-Section, Low Transverse Trial of labor: No C-section categorization: Primary      Baby Feeding: Bottle and Breast Disposition:NICU  Whitney Buffy DNP, CNM  01/04/19  9:37 AM

## 2019-01-01 NOTE — Progress Notes (Addendum)
Small amount pink vaginal drainage noted this am after voiding.

## 2019-01-01 NOTE — Anesthesia Postprocedure Evaluation (Signed)
Anesthesia Post Note  Patient: Visual merchandiser  Procedure(s) Performed: CESAREAN SECTION (N/A )     Patient location during evaluation: PACU Anesthesia Type: Epidural Level of consciousness: oriented and awake and alert Pain management: pain level controlled Vital Signs Assessment: post-procedure vital signs reviewed and stable Respiratory status: spontaneous breathing, respiratory function stable and patient connected to nasal cannula oxygen Cardiovascular status: blood pressure returned to baseline and stable Postop Assessment: no headache, no backache and no apparent nausea or vomiting Anesthetic complications: no    Last Vitals:  Vitals:   01/01/19 1730 01/01/19 1745  BP: 93/66 93/66  Pulse: (!) 104 (!) 106  Resp: 16 (!) 35  Temp:    SpO2: 95% 99%    Last Pain:  Vitals:   01/01/19 1745  TempSrc:   PainSc: 0-No pain   Pain Goal: Patients Stated Pain Goal: 2 (01/01/19 0524)  LLE Motor Response: Purposeful movement (01/01/19 1745)   RLE Motor Response: Purposeful movement (01/01/19 1745)       Epidural/Spinal Function Cutaneous sensation: Tingles (01/01/19 1730), Patient able to flex knees: No (01/01/19 1730), Patient able to lift hips off bed: No (01/01/19 1730), Back pain beyond tenderness at insertion site: No (01/01/19 1730), Progressively worsening motor and/or sensory loss: No (01/01/19 1730), Bowel and/or bladder incontinence post epidural: No (01/01/19 1730)  Faatima Tench DANIEL

## 2019-01-02 ENCOUNTER — Encounter (HOSPITAL_COMMUNITY): Payer: Self-pay | Admitting: *Deleted

## 2019-01-02 LAB — CBC
HCT: 27.6 % — ABNORMAL LOW (ref 36.0–46.0)
Hemoglobin: 9.5 g/dL — ABNORMAL LOW (ref 12.0–15.0)
MCH: 31 pg (ref 26.0–34.0)
MCHC: 34.4 g/dL (ref 30.0–36.0)
MCV: 90.2 fL (ref 80.0–100.0)
Platelets: 264 10*3/uL (ref 150–400)
RBC: 3.06 MIL/uL — ABNORMAL LOW (ref 3.87–5.11)
RDW: 13.4 % (ref 11.5–15.5)
WBC: 16 10*3/uL — ABNORMAL HIGH (ref 4.0–10.5)
nRBC: 0 % (ref 0.0–0.2)

## 2019-01-02 LAB — CREATININE, SERUM
Creatinine, Ser: 0.62 mg/dL (ref 0.44–1.00)
GFR calc Af Amer: >60 mL/min (ref >60)
GFR calc non Af Amer: >60 mL/min (ref >60)

## 2019-01-02 MED ORDER — RHO D IMMUNE GLOBULIN 1500 UNIT/2ML IJ SOSY
300.0000 ug | PREFILLED_SYRINGE | Freq: Once | INTRAMUSCULAR | Status: AC
  Administered 2019-01-02: 09:00:00 300 ug via INTRAVENOUS
  Filled 2019-01-02: qty 2

## 2019-01-02 MED ORDER — IBUPROFEN 600 MG PO TABS
600.0000 mg | ORAL_TABLET | Freq: Four times a day (QID) | ORAL | Status: DC | PRN
  Administered 2019-01-02 – 2019-01-04 (×6): 600 mg via ORAL
  Filled 2019-01-02 (×6): qty 1

## 2019-01-02 MED ORDER — KETOROLAC TROMETHAMINE 30 MG/ML IJ SOLN
30.0000 mg | Freq: Four times a day (QID) | INTRAMUSCULAR | Status: DC
  Administered 2019-01-02 (×2): 30 mg via INTRAVENOUS
  Filled 2019-01-02 (×3): qty 1

## 2019-01-02 NOTE — Progress Notes (Signed)
Subjective: Postpartum Day 1: Cesarean Delivery Patient reports incisional pain and tolerating PO.    Objective: Vital signs in last 24 hours: Temp:  [97.8 F (36.6 C)-98.5 F (36.9 C)] 97.9 F (36.6 C) (09/13 0330) Pulse Rate:  [64-138] 64 (09/13 0218) Resp:  [13-35] 18 (09/12 2130) BP: (76-120)/(48-79) 88/60 (09/13 0218) SpO2:  [95 %-100 %] 98 % (09/13 0218)  Physical Exam:  General: alert, cooperative and no distress Lochia: appropriate Uterine Fundus: firm Incision: dressing dry, pressure in place DVT Evaluation: No evidence of DVT seen on physical exam.  Recent Labs    12/31/18 1110 01/02/19 0549  HGB 11.1* 9.5*  HCT 33.3* 27.6*    Assessment/Plan: Status post Cesarean section. Doing well postoperatively.  Continue current care Anticipate discharge in 2 days.  Florian Buff 01/02/2019, 7:47 AM

## 2019-01-03 LAB — RH IG WORKUP (INCLUDES ABO/RH)
ABO/RH(D): B NEG
Fetal Screen: NEGATIVE
Gestational Age(Wks): 32.1
Unit division: 0

## 2019-01-03 LAB — CULTURE, BETA STREP (GROUP B ONLY)

## 2019-01-03 NOTE — Progress Notes (Signed)
Post-op/Post Partum Day 2 Subjective no complaints, up ad lib, voiding and tolerating PO, small lochia, plans to breastfeed, undecided. States is passing gas, but no bowel movement yet. Patient is worried to use her muscles to strain. She has some mild swelling. Reports using opioid pain meds x1 in the last 24 hours. No issues with urination.  Objective: Blood pressure 92/67, pulse 91, temperature 98.1 F (36.7 C), temperature source Oral, resp. rate 17, height 4' 10"  (1.473 m), weight 56.2 kg, last menstrual period 05/21/2018, SpO2 100 %, unknown if currently breastfeeding.  Physical Exam:  General: alert, cooperative and no distress Lochia:normal flow Chest: CTAB Heart: RRR no m/r/g Abdomen: +BS, soft, nontender,  Uterine Fundus: firm DVT Evaluation: No evidence of DVT seen on physical exam. Extremities: 1+ edema up to midshin  Recent Labs    12/31/18 1110 01/02/19 0549  HGB 11.1* 9.5*  HCT 33.3* 27.6*    Assessment/Plan: Plan for discharge tomorrow  POD #2 primary LTCS for NRFHT, placental abruption, hx of fibroids/ Crohn's disease on stelara. Patient is overall doing well. Pain is decently well controlled with po meds. Encouraged ambulation.   RH neg, patient has received rhogam this hospitalization.    LOS: 39 days   Nikki Dom Cathyrn Deas 0/25/8527, 7:82 AM

## 2019-01-03 NOTE — Lactation Note (Signed)
This note was copied from a baby's chart. Lactation Consultation Note  Patient Name: Whitney Lowe CVUDT'H Date: 01/03/2019 Reason for consult: Follow-up assessment;1st time breastfeeding;Primapara;Preterm <34wks;NICU baby;Infant < 6lbs  Visited with mom of a 75 hours old NICU female, mom is pumping every 3 hours and already getting some drops of colostrum. She proudly told LC she got about 2 ml on her last pumping session, praised her for her efforts. Mom has also been practicing breast massage and hand expression, but "it's all new to her" and she doesn't feel very comfortable with it. Advised her to try breast massage to her comfort level prior pumping to maximize yield/drops.  Reviewed lactogenesis II, pumping schedule and breastmilk storage guidelines. Mom can't sleep longer than 5 hours, but she's getting very sleepy after pumping sessions, reassure to mom that is common and one of the effects of oxytocin, because BF is working.  Feeding plan:  1. Encouraged mom to pump every 3 hours, at least 8 pumping sessions in 24 hours 2. Mom will do some breast massage and practice hand expression to her comfort level prior pumping  Mom reported all questions and concerns were answered, she's aware of [place] OP services and will call PRN.  Maternal Data    Feeding Feeding Type: Donor Breast Milk  LATCH Score                   Interventions Interventions: Breast feeding basics reviewed  Lactation Tools Discussed/Used     Consult Status Consult Status: PRN Follow-up type: In-patient    Whitney Lowe 01/03/2019, 2:25 PM

## 2019-01-04 LAB — TYPE AND SCREEN
ABO/RH(D): B NEG
Antibody Screen: POSITIVE
Unit division: 0
Unit division: 0

## 2019-01-04 LAB — BPAM RBC
Blood Product Expiration Date: 202009222359
Blood Product Expiration Date: 202010082359
ISSUE DATE / TIME: 202009042047
Unit Type and Rh: 1700
Unit Type and Rh: 1700

## 2019-01-04 MED ORDER — MEASLES, MUMPS & RUBELLA VAC IJ SOLR: 0.5000 mL | Freq: Once | INTRAMUSCULAR | Status: DC

## 2019-01-04 MED ORDER — INFLUENZA VAC SPLIT QUAD 0.5 ML IM SUSY: 0.5000 mL | PREFILLED_SYRINGE | INTRAMUSCULAR | Status: DC

## 2019-01-04 MED ORDER — OXYCODONE HCL 5 MG PO TABS: 5.0000 mg | ORAL_TABLET | ORAL | 0 refills | Status: AC | PRN

## 2019-01-04 MED ORDER — IBUPROFEN 800 MG PO TABS: 800.0000 mg | ORAL_TABLET | Freq: Three times a day (TID) | ORAL | 0 refills | Status: DC | PRN

## 2019-01-04 NOTE — Progress Notes (Signed)
Patient has declined MMR, Tdap, Flu vaccine during hospitalization. She has read information regarding each vaccination and aware of risk of not getting vaccinations. She does not want any shots at this time, stating "my body needs a rest". She intends to get vaccinations at her postpartum visit. She also declined Lovenox. She is out of bed, ambulates frequently in room and hall.

## 2019-01-05 NOTE — Clinical Social Work Maternal (Signed)
CLINICAL SOCIAL WORK MATERNAL/CHILD NOTE  Patient Details  Name: Whitney Lowe MRN: [id number] Date of Birth: 02/05/1986  Date:  01/05/2019  Clinical Social Worker Initiating Note:  Abundio Miu, Hermann Date/Time: Initiated:  01/04/19/1530     Child's Name:  Whitney Lowe   Biological Parents:  Mother, Father(Father: Whitney Lowe)   Need for Interpreter:  None   Reason for Referral:  Parental Support of Premature Babies < 32 weeks/or Critically Ill babies   Address:  [place] [address]    Phone number:  [phone] (home)     Additional phone number:   Household Members/Support Persons (HM/SP):   Household Member/Support Person 1   HM/SP Name Relationship DOB or Age  HM/SP -1 Whitney Lowe Husband/FOB    HM/SP -2        HM/SP -3        HM/SP -4        HM/SP -5        HM/SP -6        HM/SP -7        HM/SP -8          Natural Supports (not living in the home):  Extended Family   Professional Supports: None   Employment: Full-time   Type of Work: Pharmacist, hospital   Education:  Production designer, theatre/television/film   Homebound arranged:    Museum/gallery curator Resources:  Medicaid   Other Resources:  [employer]   Cultural/Religious Considerations Which May Impact Care:    Strengths:  Ability to meet basic needs , Engineer, materials, Home prepared for child , Understanding of illness   Psychotropic Medications:         Pediatrician:    [place]olicitor area  Pediatrician List:   [facility]      Pediatrician Fax Number:    Risk Factors/Current Problems:  None   Cognitive State:  Able to Concentrate , Alert , Insightful , Goal Oriented , Linear Thinking    Mood/Affect:  Calm , Happy , Interested , Relaxed    CSW Assessment: CSW met with MOB/FOB at infant's bedside to discuss infant's NICU admission. CSW introduced self and explained reason for visit.  MOB and FOB were welcoming and polite during assessment. MOB reported that she resides with her husband and their fur baby. 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 MOB reported that she is not currently working and receives [facility] (has an appointment this Thursday). MOB reported that she has all items needed for infant including a car seat, crib and basinet. CSW inquired about MOB's support system, MOB reported that her sisters in law were her supports.   CSW and MOB/FOB discussed infant's NICU admission. MOB reported that the staff has been wonderful and that they feel well informed. CSW informed parents about the NICU, what to expect and resources/supports available while infant is admitted to the NICU. MOB reported that information on financial assistance would be helpful. CSW provided MOB with information about the [employer] and explained the application process. MOB denied any additional needs/concerns. CSW encouraged parents to contact CSW if any needs/concerns arise.   CSW asked FOB to leave the room to speak with MOB privately, FOB left voluntarily. CSW inquired about MOB's mental health history. MOB reported that she has ADHD and is  not currently taking any medication. MOB reported that she has just been utilizing coping skills which has been effective. MOB reported that she also has a history of anxiety and the last time she had symptoms unrelated to giving birth was more than a year ago. MOB denied any other mental health history. MOB presented calm and pleasant. MOB did not demonstrate any acute mental health signs/symptoms. CSW assessed for safety, MOB denied SI, HI and domestic violence.  CSW provided education regarding the baby blues period vs. perinatal mood disorders, discussed treatment and gave resources for mental health follow up if concerns arise.  CSW recommends self-evaluation during the postpartum time period using  the New Mom Checklist from Postpartum Progress and encouraged MOB to contact a medical professional if symptoms are noted at any time.    CSW will continue to offer resources/supports while infant is admitted to the NICU.   CSW Plan/Description:  Perinatal Mood and Anxiety Disorder (PMADs) Education, Other Patient/Family Education, Other Information/Referral to [employer], LCSW 01/05/2019, 11:48 AM

## 2019-01-07 ENCOUNTER — Other Ambulatory Visit: Payer: Self-pay

## 2019-01-07 ENCOUNTER — Inpatient Hospital Stay (HOSPITAL_COMMUNITY)
Admission: AD | Admit: 2019-01-07 | Discharge: 2019-01-07 | Disposition: A | Discharge status: Home / Self Care | Payer: Medicaid Other | Attending: Obstetrics and Gynecology | Admitting: Obstetrics and Gynecology

## 2019-01-07 ENCOUNTER — Encounter (HOSPITAL_COMMUNITY): Payer: Self-pay

## 2019-01-07 DIAGNOSIS — R030 Elevated blood-pressure reading, without diagnosis of hypertension: Secondary | ICD-10-CM | POA: Diagnosis not present

## 2019-01-07 DIAGNOSIS — Z9889 Other specified postprocedural states: Secondary | ICD-10-CM | POA: Diagnosis not present

## 2019-01-07 DIAGNOSIS — G44209 Tension-type headache, unspecified, not intractable: Secondary | ICD-10-CM | POA: Insufficient documentation

## 2019-01-07 DIAGNOSIS — Z8249 Family history of ischemic heart disease and other diseases of the circulatory system: Secondary | ICD-10-CM | POA: Insufficient documentation

## 2019-01-07 DIAGNOSIS — Z87891 Personal history of nicotine dependence: Secondary | ICD-10-CM | POA: Insufficient documentation

## 2019-01-07 DIAGNOSIS — Z818 Family history of other mental and behavioral disorders: Secondary | ICD-10-CM | POA: Insufficient documentation

## 2019-01-07 DIAGNOSIS — Z833 Family history of diabetes mellitus: Secondary | ICD-10-CM | POA: Insufficient documentation

## 2019-01-07 DIAGNOSIS — R51 Headache: Secondary | ICD-10-CM | POA: Diagnosis present

## 2019-01-07 DIAGNOSIS — K5080 Crohn's disease of both small and large intestine without complications: Secondary | ICD-10-CM | POA: Diagnosis not present

## 2019-01-07 DIAGNOSIS — Z825 Family history of asthma and other chronic lower respiratory diseases: Secondary | ICD-10-CM | POA: Diagnosis not present

## 2019-01-07 DIAGNOSIS — Z79899 Other long term (current) drug therapy: Secondary | ICD-10-CM | POA: Diagnosis not present

## 2019-01-07 LAB — COMPREHENSIVE METABOLIC PANEL
ALT: 22 U/L (ref 0–44)
AST: 18 U/L (ref 15–41)
Albumin: 2.3 g/dL — ABNORMAL LOW (ref 3.5–5.0)
Alkaline Phosphatase: 82 U/L (ref 38–126)
Anion gap: 8 (ref 5–15)
BUN: 13 mg/dL (ref 6–20)
CO2: 23 mmol/L (ref 22–32)
Calcium: 8.6 mg/dL — ABNORMAL LOW (ref 8.9–10.3)
Chloride: 107 mmol/L (ref 98–111)
Creatinine, Ser: 0.69 mg/dL (ref 0.44–1.00)
GFR calc Af Amer: >60 mL/min (ref >60)
GFR calc non Af Amer: >60 mL/min (ref >60)
Glucose, Bld: 87 mg/dL (ref 70–99)
Potassium: 4.2 mmol/L (ref 3.5–5.1)
Sodium: 138 mmol/L (ref 135–145)
Total Bilirubin: 0.4 mg/dL (ref 0.3–1.2)
Total Protein: 6.2 g/dL — ABNORMAL LOW (ref 6.5–8.1)

## 2019-01-07 LAB — URINALYSIS, ROUTINE W REFLEX MICROSCOPIC
Bacteria, UA: NONE SEEN
Bilirubin Urine: NEGATIVE
Glucose, UA: NEGATIVE mg/dL
Ketones, ur: NEGATIVE mg/dL
Nitrite: NEGATIVE
Protein, ur: NEGATIVE mg/dL
RBC / HPF: >50 RBC/hpf — ABNORMAL HIGH (ref 0–5)
Specific Gravity, Urine: 1.016 (ref 1.005–1.030)
pH: 7 (ref 5.0–8.0)

## 2019-01-07 LAB — CBC WITH DIFFERENTIAL/PLATELET
Abs Immature Granulocytes: 0.13 10*3/uL — ABNORMAL HIGH (ref 0.00–0.07)
Basophils Absolute: 0 10*3/uL (ref 0.0–0.1)
Basophils Relative: 0 %
Eosinophils Absolute: 0.1 10*3/uL (ref 0.0–0.5)
Eosinophils Relative: 2 %
HCT: 29.5 % — ABNORMAL LOW (ref 36.0–46.0)
Hemoglobin: 9.6 g/dL — ABNORMAL LOW (ref 12.0–15.0)
Immature Granulocytes: 2 %
Lymphocytes Relative: 33 %
Lymphs Abs: 2.7 10*3/uL (ref 0.7–4.0)
MCH: 30.4 pg (ref 26.0–34.0)
MCHC: 32.5 g/dL (ref 30.0–36.0)
MCV: 93.4 fL (ref 80.0–100.0)
Monocytes Absolute: 0.5 10*3/uL (ref 0.1–1.0)
Monocytes Relative: 6 %
Neutro Abs: 4.8 10*3/uL (ref 1.7–7.7)
Neutrophils Relative %: 57 %
Platelets: 405 10*3/uL — ABNORMAL HIGH (ref 150–400)
RBC: 3.16 MIL/uL — ABNORMAL LOW (ref 3.87–5.11)
RDW: 13.3 % (ref 11.5–15.5)
WBC: 8.3 10*3/uL (ref 4.0–10.5)
nRBC: 0 % (ref 0.0–0.2)

## 2019-01-07 MED ORDER — LACTATED RINGERS IV BOLUS: 1000.0000 mL | Freq: Once | INTRAVENOUS | Status: DC

## 2019-01-07 MED ORDER — OXYCODONE HCL 5 MG PO TABS
10.0000 mg | ORAL_TABLET | Freq: Once | ORAL | Status: AC
  Administered 2019-01-07: 10 mg via ORAL
  Filled 2019-01-07: qty 2

## 2019-01-07 MED ORDER — DIPHENHYDRAMINE HCL 50 MG/ML IJ SOLN
25.0000 mg | Freq: Once | INTRAMUSCULAR | Status: AC
  Administered 2019-01-07: 25 mg via INTRAVENOUS
  Filled 2019-01-07: qty 1

## 2019-01-07 MED ORDER — METOCLOPRAMIDE HCL 5 MG/ML IJ SOLN
10.0000 mg | Freq: Once | INTRAMUSCULAR | Status: DC
  Filled 2019-01-07: qty 2

## 2019-01-07 MED ORDER — DEXAMETHASONE SODIUM PHOSPHATE 10 MG/ML IJ SOLN
10.0000 mg | Freq: Once | INTRAMUSCULAR | Status: DC
  Filled 2019-01-07: qty 1

## 2019-01-07 NOTE — Progress Notes (Signed)
Called Sam CNM to bedside for evaluation of patient's acute onset of symptoms - increased headache and "indigestion" and feeling of shortness of breath but not chest pain. This happened after receiving IV Benadryl.  Provider came to bedside and evaluated patient. EKG ordered. Patient given oxycodone and headache resolved  to 2/10 by discharge.

## 2019-01-07 NOTE — MAU Provider Note (Addendum)
History    [name] is a 33 y.o. [id number] s/p emergency primary cesarean on 01/01/2019 for placental abruption who presents to MAU for elevated blood pressure readings at home and headache. Patient was discharged home on 01/04/2019. Headache began the following day and has been constant ever since. While resting in MAU patient rates pain 8/10. Pain is located on the right side of patient's head and radiates to her temporal lobe and back of head. Describes pain as an "achey dull" sensation. Patient states that rest, water, food, and pain medications have not alleviated her symptoms. Patient also complains of chest tightness yesterday that has resolved. Of note, patient's baby is currently in the NICU and patient states that she has had decreased food and fluid intake. Patient is currently breast feeding.  Patient states blood pressures at home were in the 638L systolic and 37D diastolic. Patient denies any visual changes and gait abnormalities.  Pt denies VB, LOF, ctx, decreased FM, vaginal discharge/odor/itching. Pt denies N/V, abdominal pain, constipation, diarrhea, or urinary problems. Pt denies fever, chills, fatigue, sweating or changes in appetite.  Pt denies SOB or chest pain. Pt denies dizziness, light-headedness, weakness.  CSN: [id number]  Arrival date and time: 01/07/19 1252   First Provider Initiated Contact with Patient 01/07/19 1343      Chief Complaint  Patient presents with  . Headache   HPI  OB History    Gravida  3   Para  1   Term      Preterm  1   AB  2   Living  1     SAB  1   TAB  1   Ectopic      Multiple  0   Live Births  1           Past Medical History:  Diagnosis Date  . ADHD    stopped abilify with preg  . ADHD (attention deficit hyperactivity disorder)   . Anemia   . Anxiety    not currently on meds  . Cataract    Rt eye  . Complication of anesthesia    became combative   . Crohn's ileocolitis ([place]) 06/19/2016  .  Eczema   . Fibroids   . Infection    UTI  . Ovarian cyst     Past Surgical History:  Procedure Laterality Date  . CESAREAN SECTION N/A 01/01/2019   Procedure: CESAREAN SECTION;  Surgeon: Sloan Leiter, MD;  Location: [facility] LD ORS;  Service: Obstetrics;  Laterality: N/A;  . COLONOSCOPY    . ESOPHAGOGASTRODUODENOSCOPY    . removal of cyst in ear     6th grade    Family History  Problem Relation Age of Onset  . Crohn's disease Mother        mom was adopted  . Heart disease Mother   . Anxiety disorder Mother   . Blindness Mother        Rt eye  . Diabetes Mellitus II Father   . COPD Father   . Hypertension Father   . Asthma Father   . ADD / ADHD Father   . Diabetes Father        type 2  . Anxiety disorder Father   . Depression Father   . Diabetes Sister        Type 2  . Colon cancer Neg Hx   . Esophageal cancer Neg Hx   . Pancreatic cancer Neg Hx   . Stomach cancer Neg  Hx   . Liver disease Neg Hx     Social History   Tobacco Use  . Smoking status: Former Smoker    Types: Cigars, Cigarettes    Quit date: 04/21/2018    Years since quitting: 0.7  . Smokeless tobacco: Never Used  . Tobacco comment: social smoker  Substance Use Topics  . Alcohol use: Not Currently    Frequency: Never    Comment: socially when not pregnant  . Drug use: Not Currently    Types: Marijuana    Comment: last use february 2020-     Allergies: No Known Allergies  Medications Prior to Admission  Medication Sig Dispense Refill Last Dose  . acetaminophen (TYLENOL) 500 MG tablet Take 1,000 mg by mouth every 6 (six) hours as needed.   01/07/2019 at Unknown time  . ibuprofen (ADVIL) 800 MG tablet Take 1 tablet (800 mg total) by mouth every 8 (eight) hours as needed. 30 tablet 0 01/06/2019 at Unknown time  . oxyCODONE (ROXICODONE) 5 MG immediate release tablet Take 1 tablet (5 mg total) by mouth every 4 (four) hours as needed for up to 5 days for severe pain. 20 tablet 0 01/07/2019 at Unknown time   . Prenatal MV-Min-FA-Omega-3 (PRENATAL GUMMIES/DHA & FA PO) Take 2 each by mouth daily.   01/06/2019 at Unknown time  . phenylephrine (,USE FOR PREPARATION-H,) 0.25 % suppository Place 1 suppository rectally 2 (two) times daily as needed for hemorrhoids.     Delsa Grana 90 MG/ML SOSY injection ADMINISTER 1 ML(90 MG) UNDER THE SKIN EVERY 8 WEEKS (Patient taking differently: Inject 90 mg into the skin once. ) 1 mL 2     Review of Systems  Constitutional: Negative for fever.  Eyes: Negative for visual disturbance.  Respiratory: Negative for shortness of breath.   Cardiovascular: Negative for chest pain and leg swelling.  Gastrointestinal: Negative for abdominal pain, constipation, diarrhea, nausea and vomiting.  Genitourinary: Negative for vaginal bleeding and vaginal discharge.  Musculoskeletal: Negative for gait problem.  Neurological: Positive for headaches. Negative for dizziness, weakness and light-headedness.   Physical Exam   Blood pressure (!) 142/87, pulse 68, temperature 98.1 F (36.7 C), resp. rate 16, weight 59.9 kg, SpO2 98 %, unknown if currently breastfeeding.   BP (!) 142/87   Pulse 68   Temp 98.1 F (36.7 C)   Resp 16   Wt 59.9 kg   SpO2 98%   BMI 27.59 kg/m   Physical Exam  Constitutional: She is oriented to person, place, and time. She appears well-developed and well-nourished.  HENT:  Head: Normocephalic and atraumatic.  Cardiovascular: Normal heart sounds.  Respiratory: Effort normal and breath sounds normal.  GI: She exhibits no distension. There is no abdominal tenderness. There is no rebound and no guarding.  Musculoskeletal:        General: No edema.  Neurological: She is alert and oriented to person, place, and time. She has normal strength. No cranial nerve deficit or sensory deficit.  Skin: Skin is warm and dry.  Psychiatric: She has a normal mood and affect. Her behavior is normal. Judgment and thought content normal.    MAU Course/MDM   Procedures   Results for orders placed or performed during the hospital encounter of 01/07/19 (from the past 24 hour(s))  Urinalysis, Routine w reflex microscopic     Status: Abnormal   Collection Time: 01/07/19  1:15 PM  Result Value Ref Range   Color, Urine YELLOW YELLOW   APPearance CLEAR CLEAR  Specific Gravity, Urine 1.016 1.005 - 1.030   pH 7.0 5.0 - 8.0   Glucose, UA NEGATIVE NEGATIVE mg/dL   Hgb urine dipstick LARGE (A) NEGATIVE   Bilirubin Urine NEGATIVE NEGATIVE   Ketones, ur NEGATIVE NEGATIVE mg/dL   Protein, ur NEGATIVE NEGATIVE mg/dL   Nitrite NEGATIVE NEGATIVE   Leukocytes,Ua TRACE (A) NEGATIVE   RBC / HPF >50 (H) 0 - 5 RBC/hpf   WBC, UA 6-10 0 - 5 WBC/hpf   Bacteria, UA NONE SEEN NONE SEEN   Squamous Epithelial / LPF 0-5 0 - 5   Mucus PRESENT   CBC with Differential/Platelet     Status: Abnormal   Collection Time: 01/07/19  2:17 PM  Result Value Ref Range   WBC 8.3 4.0 - 10.5 K/uL   RBC 3.16 (L) 3.87 - 5.11 MIL/uL   Hemoglobin 9.6 (L) 12.0 - 15.0 g/dL   HCT 29.5 (L) 36.0 - 46.0 %   MCV 93.4 80.0 - 100.0 fL   MCH 30.4 26.0 - 34.0 pg   MCHC 32.5 30.0 - 36.0 g/dL   RDW 13.3 11.5 - 15.5 %   Platelets 405 (H) 150 - 400 K/uL   nRBC 0.0 0.0 - 0.2 %   Neutrophils Relative % 57 %   Neutro Abs 4.8 1.7 - 7.7 K/uL   Lymphocytes Relative 33 %   Lymphs Abs 2.7 0.7 - 4.0 K/uL   Monocytes Relative 6 %   Monocytes Absolute 0.5 0.1 - 1.0 K/uL   Eosinophils Relative 2 %   Eosinophils Absolute 0.1 0.0 - 0.5 K/uL   Basophils Relative 0 %   Basophils Absolute 0.0 0.0 - 0.1 K/uL   Immature Granulocytes 2 %   Abs Immature Granulocytes 0.13 (H) 0.00 - 0.07 K/uL  Comprehensive metabolic panel     Status: Abnormal   Collection Time: 01/07/19  2:17 PM  Result Value Ref Range   Sodium 138 135 - 145 mmol/L   Potassium 4.2 3.5 - 5.1 mmol/L   Chloride 107 98 - 111 mmol/L   CO2 23 22 - 32 mmol/L   Glucose, Bld 87 70 - 99 mg/dL   BUN 13 6 - 20 mg/dL   Creatinine, Ser 0.69  0.44 - 1.00 mg/dL   Calcium 8.6 (L) 8.9 - 10.3 mg/dL   Total Protein 6.2 (L) 6.5 - 8.1 g/dL   Albumin 2.3 (L) 3.5 - 5.0 g/dL   AST 18 15 - 41 U/L   ALT 22 0 - 44 U/L   Alkaline Phosphatase 82 38 - 126 U/L   Total Bilirubin 0.4 0.3 - 1.2 mg/dL   GFR calc non Af Amer >60 >60 mL/min   GFR calc Af Amer >60 >60 mL/min   Anion gap 8 5 - [place] PA-S 01/07/2019, 2:10 PM   I confirm that I have verified the information documented in the physician assistant student's note and that I have also personally reperformed the history, physical exam and all medical decision making activities of this service and have verified that all service and findings are accurately documented in this student's note.   --Around 1440 I was called to the room to evaluate patient response to IV push Benadryl. Patient was noticeably less oriented and less engaged than on my initial assessment but vital signs were stable and she responded appropriately to questions. She reported new onset tightness in her throat at that time as well as new onset SOB. She denied difficulty breathing  but was noted to be touching her throat throughout CNM assessment. Her symptoms resolved within 15 minutes but decision was made by patient and CNM to hold remaining components of headache cocktail.  --Patient normotensive with headache pain 2/10 one hour after eating large meal and receiving 10 mg Roxicodone --PEC labs normal --Patient counseled about meal planning, ensuring hydration in setting of frequent and prolonged NICU visits --Discussed with Dr. Elly Modena at 1700. Pt stable for discharge.  Patient Vitals for the past 24 hrs:  BP Temp Pulse Resp SpO2 Weight  01/07/19 1708 123/79 - - - - -  01/07/19 1701 123/79 - 70 - - -  01/07/19 1646 126/79 - 70 - - -  01/07/19 1631 123/84 - 75 - - -  01/07/19 1616 121/72 - 71 - - -  01/07/19 1601 131/81 - 74 - - -  01/07/19 1531 132/85 - 76 - - -  01/07/19 1524 (!) 145/84 - 70 - -  -  01/07/19 1450 - - - - 93 % -  01/07/19 1446 (!) 159/95 - 93 - - -  01/07/19 1445 - - - - 94 % -  01/07/19 1440 - - - - 100 % -  01/07/19 1437 (!) 163/106 - (!) 108 - - -  01/07/19 1416 (!) 147/81 - 77 - - -  01/07/19 1401 (!) 142/91 - 70 - - -  01/07/19 1354 (!) 142/87 - 68 - - -  01/07/19 1340 - - - - 98 % 59.9 kg  01/07/19 1337 136/86 - 67 - - -  01/07/19 1317 132/83 98.1 F (36.7 C) 71 16 97 % -   Assessment and Plan  --33 y.o. [id number] 6 days postpartum following primary cesarean section at 32.1 weeks --New onset elevated blood pressure, normal PEC labs --S/p normal EKG --Headache 2/10 at discharge --Discharge home in stable condition  F/U: Per Dr. Elly Modena, patient to have blood pressure check in clinic Monday 01/10/19. Message sent to clinic  Mallie Snooks, MSN, CNM Certified Nurse Midwife, Faculty Practice 01/07/19 5:48 PM

## 2019-01-07 NOTE — Discharge Instructions (Signed)
General Headache Without Cause A headache is pain or discomfort that is felt around the head or neck area. There are many causes and types of headaches. In some cases, the cause may not be found. Follow these instructions at home: Watch your condition for any changes. Let your doctor know about them. Take these steps to help with your condition: Managing pain      Take over-the-counter and prescription medicines only as told by your doctor.  Lie down in a dark, quiet room when you have a headache.  If told, put ice on your head and neck area: ? Put ice in a plastic bag. ? Place a towel between your skin and the bag. ? Leave the ice on for 20 minutes, 2-3 times per day.  If told, put heat on the affected area. Use the heat source that your doctor recommends, such as a moist heat pack or a heating pad. ? Place a towel between your skin and the heat source. ? Leave the heat on for 20-30 minutes. ? Remove the heat if your skin turns bright red. This is very important if you are unable to feel pain, heat, or cold. You may have a greater risk of getting burned.  Keep lights dim if bright lights bother you or make your headaches worse. Eating and drinking  Eat meals on a regular schedule.  If you drink alcohol: ? Limit how much you use to:  0-1 drink a day for women.  0-2 drinks a day for men. ? Be aware of how much alcohol is in your drink. In the [place] one drink equals one 12 oz bottle of beer (355 mL), one 5 oz glass of wine (148 mL), or one 1 oz glass of hard liquor (44 mL).  Stop drinking caffeine, or reduce how much caffeine you drink. General instructions   Keep a journal to find out if certain things bring on headaches. For example, write down: ? What you eat and drink. ? How much sleep you get. ? Any change to your diet or medicines.  Get a massage or try other ways to relax.  Limit stress.  Sit up straight. Do not tighten (tense) your muscles.  Do not use any  products that contain nicotine or tobacco. This includes cigarettes, e-cigarettes, and chewing tobacco. If you need help quitting, ask your doctor.  Exercise regularly as told by your doctor.  Get enough sleep. This often means 7-9 hours of sleep each night.  Keep all follow-up visits as told by your doctor. This is important. Contact a doctor if:  Your symptoms are not helped by medicine.  You have a headache that feels different than the other headaches.  You feel sick to your stomach (nauseous) or you throw up (vomit).  You have a fever. Get help right away if:  Your headache gets very bad quickly.  Your headache gets worse after a lot of physical activity.  You keep throwing up.  You have a stiff neck.  You have trouble seeing.  You have trouble speaking.  You have pain in the eye or ear.  Your muscles are weak or you lose muscle control.  You lose your balance or have trouble walking.  You feel like you will pass out (faint) or you pass out.  You are mixed up (confused).  You have a seizure. Summary  A headache is pain or discomfort that is felt around the head or neck area.  There are many causes and  types of headaches. In some cases, the cause may not be found.  Keep a journal to help find out what causes your headaches. Watch your condition for any changes. Let your doctor know about them.  Contact a doctor if you have a headache that is different from usual, or if your headache is not helped by medicine.  Get help right away if your headache gets very bad, you throw up, you have trouble seeing, you lose your balance, or you have a seizure. This information is not intended to replace advice given to you by your health care provider. Make sure you discuss any questions you have with your health care provider. Document Released: 01/15/2008 Document Revised: 10/26/2017 Document Reviewed: 10/26/2017 Elsevier Patient Education  2020 [employer].

## 2019-01-07 NOTE — MAU Note (Signed)
.   Whitney Lowe is a 33 y.o. at Unknown here in MAU reporting: severe headache and increase in blood pressure.Pt states she had SROM at 27 weeks and delivered on September the12th at 29 weeks.    Pain score: 8 Vitals:   01/07/19 1317  BP: 132/83  Pulse: 71  Resp: 16  Temp: 98.1 F (36.7 C)  SpO2: 97%      Lab orders placed from triage: UA

## 2019-01-07 NOTE — MAU Note (Signed)
Provider aware of patient's blood pressure.

## 2019-01-07 NOTE — MAU Note (Signed)
PT has had a headache since Wednesday and it has increased since then.. Took Bps at home and they were elevated. Denies vision changes, epigastric pain or swelling. Has been taking pain meds since discharge.

## 2019-01-07 NOTE — Progress Notes (Signed)
   01/07/19 1437  Vital Signs  BP (!) 163/106   Provider notified of BP.

## 2019-01-10 ENCOUNTER — Ambulatory Visit (INDEPENDENT_AMBULATORY_CARE_PROVIDER_SITE_OTHER): Payer: Medicaid Other

## 2019-01-10 ENCOUNTER — Ambulatory Visit: Payer: Self-pay

## 2019-01-10 ENCOUNTER — Other Ambulatory Visit: Payer: Self-pay

## 2019-01-10 ENCOUNTER — Telehealth: Payer: Self-pay

## 2019-01-10 VITALS — BP 130/78 | Wt 132.2 lb

## 2019-01-10 DIAGNOSIS — Z0130 Encounter for examination of blood pressure without abnormal findings: Secondary | ICD-10-CM

## 2019-01-10 NOTE — Progress Notes (Signed)
Pt here today for BP check s/p visit to MAU on 01/01/19 with elevated BP.  Pt reports that she took her BP at home at it was 140's/90's with headaches.  Pt reports that she was given Oxycodone 5 mg tablet and that was effective for her headaches.  Pt BP manually 130/78 LA.  Pt reports headache pain 4/10 on pain scale and last dose of ibuprofen 800 mg at 1030.  Notified Dr. Nehemiah Settle who recommends that pt take Tylenol and Ibuprofen together one tablet each to see if effective making sure that she does not take more 3g in a day.  Notified pt provider's recommendation.  Pt verbalized understanding.  Incision check today as well due to pt having an appt scheduled on 01/13/19 for incision check.  Incision well approximated, no odor, no drainage, and no redness.  Pt encouraged to continue to monitor for infection.  Pt verbalized understanding.   Mel Almond, RN 01/10/19

## 2019-01-10 NOTE — Telephone Encounter (Signed)
Pt LM on nurse callback line stating that she was advised by our clinical staff to go to MAU on Friday due to elevated BP with severe headache.  Pt reports that she went to MAU and was given oxycodone 5 mg two tablets for her headaches and wants to request refill and to f/u on BP check appt.   Called pt and pt informed me that she was able to get an appt scheduled for today for BP check. I informed pt that in regards to refill request of oxycodone we usually would not refill that medication for headaches and that we recommended that she takes the ibuprofen to help with inflammation in her c-section incision and that we will discuss at her appt today. Pt verbalized understanding with no further questions.

## 2019-01-10 NOTE — Lactation Note (Signed)
This note was copied from a baby's chart. Lactation Consultation Note  Patient Name: Whitney Lowe LKHVF'M Date: 01/10/2019  [name] is 25 days old.  Mom is pumping with her piston hand pump every 3 hours and obtaining 20-30 mls.  She has a Engineer, production DEBP at home but feels she obtains more with manual pump.  Mom is not using the symphony pump in NICU.  She is doing a good job massaging breasts prior to pumping.  Mom denies ever feeling fullness or engorgement.  Mom also has [place]. Discussed with mom that using a hospital grade DEBP may be more effective for increasing her milk supply.  Mom agrees to try symphony pump.  Pump set up and reviewed with mom.  Discussed the need to pump for several more weeks so a hands free bra may be beneficial.  Explained how she could make one.  Reviewed hands on pumping.  Instructed to pump at least 8 times in 24 hours.  Encouraged to call for assist/concerns.   Maternal Data    Feeding Feeding Type: Donor Breast Milk  LATCH Score                   Interventions    Lactation Tools Discussed/Used     Consult Status      [name] 01/10/2019, 11:41 AM

## 2019-01-11 NOTE — Progress Notes (Signed)
Chart reviewed - agree with RN documentation.   

## 2019-01-13 ENCOUNTER — Ambulatory Visit: Payer: Medicaid Other

## 2019-01-13 ENCOUNTER — Telehealth: Payer: Self-pay | Admitting: Clinical

## 2019-01-13 NOTE — Telephone Encounter (Signed)
Spoke with patient about her appointment on 9/25 @ 9:15. Patient instructed that the appointment is a mychart visit. Patient instructed to download the mychart app if not already done so. Patient verbalized she has the app downloaded. Patient instructed that a nurse will be calling her around her appointment time.

## 2019-01-14 ENCOUNTER — Other Ambulatory Visit: Payer: Self-pay

## 2019-01-14 ENCOUNTER — Ambulatory Visit (INDEPENDENT_AMBULATORY_CARE_PROVIDER_SITE_OTHER): Payer: Medicaid Other | Admitting: Clinical

## 2019-01-14 DIAGNOSIS — F909 Attention-deficit hyperactivity disorder, unspecified type: Secondary | ICD-10-CM | POA: Diagnosis not present

## 2019-01-14 NOTE — BH Specialist Note (Signed)
Integrated Behavioral Health via Telemedicine Video Visit  01/14/2019 [name] [id number]  Number of [place] visits: 1 Session Start time: 9:16  Session End time: 10:00 Total time: 45 minutes  Referring Provider: [name], MD Type of Visit: Video Patient/Family location: Home [facility] Provider location: [facility] All persons participating in visit: Patient Whitney Lowe and [place]  Confirmed patient's address: Yes  Confirmed patient's phone number: Yes  Any changes to demographics: No   Confirmed patient's insurance: Yes  Any changes to patient's insurance: No   Discussed confidentiality: Yes   I connected with Whitney Lowe  by a video enabled telemedicine application and verified that I am speaking with the correct person using two identifiers.     I discussed the limitations of evaluation and management by telemedicine and the availability of in person appointments.  I discussed that the purpose of this visit is to provide behavioral health care while limiting exposure to the novel coronavirus.   Discussed there is a possibility of technology failure and discussed alternative modes of communication if that failure occurs.  I discussed that engaging in this video visit, they consent to the provision of behavioral healthcare and the services will be billed under their insurance.  Patient and/or legal guardian expressed understanding and consented to video visit: Yes   PRESENTING CONCERNS: Patient and/or family reports the following symptoms/concerns: Pt states her primary concern today is wanting to prevent postpartum depression; pt has been treated for ADHD in the past, and feels a little more anxiety postpartum. Pt uses numerous self-coping strategies daily to manage anxiety. Duration of problem: Postpartum increase; ongoing symptoms; Severity of problem: mild  STRENGTHS (Protective Factors/Coping Skills): Strong social support, strong coping  skills  GOALS ADDRESSED: Patient will: 1.  Maintain reduction of  symptoms of: anxiety  2.  Increase knowledge and/or ability of: healthy habits  3.  Demonstrate ability to: Increase healthy adjustment to current life circumstances  INTERVENTIONS: Interventions utilized:  Psychoeducation and/or Health Education and Link to [employer] Standardized Assessments completed: GAD-7 and PHQ 9  ASSESSMENT: Patient currently experiencing ADHD  Patient may benefit from psychoeducation and brief therapeutic interventions regarding coping with symptoms of anxiety .  PLAN: 1. Follow up with behavioral health clinician on : As needed 2. Behavioral recommendations:  -Continue using daily self-coping strategies that are working to manage anxiety; also continue healthy sleeping and eating -Continue taking prenatal vitamin w iron until postpartum medical visit; discuss with provider at that time -When baby comes home from NICU, sleep when baby sleeps  -Consider attending new mom support groups (Mom Talk, Breastfeeding support via conehealthybaby.com and/or postpartum.net) -Consider Inez.com for tips to introduce family dog to baby 3. Referral(s): [place] (In Clinic) and [employer]:  new mom support  I discussed the assessment and treatment plan with the patient and/or parent/guardian. They were provided an opportunity to ask questions and all were answered. They agreed with the plan and demonstrated an understanding of the instructions.   They were advised to call back or seek an in-person evaluation if the symptoms worsen or if the condition fails to improve as anticipated.  Whitney Lowe Chritopher Lowe  Depression screen [facility] 2/9 01/14/2019 09/30/2018  Decreased Interest 0 1  Down, Depressed, Hopeless 0 0  PHQ - 2 Score 0 1  Altered sleeping 0 0  Tired, decreased energy 1 1  Change in appetite 0 1  Feeling bad or failure about yourself  0 0  Trouble  concentrating 0 0  Moving slowly or fidgety/restless 0 0  Suicidal thoughts 0 0  PHQ-9 Score 1 3   GAD 7 : Generalized Anxiety Score 01/14/2019 09/30/2018  Nervous, Anxious, on Edge 1 3  Control/stop worrying 0 0  Worry too much - different things 1 0  Trouble relaxing 0 1  Restless 0 3  Easily annoyed or irritable 0 1  Afraid - awful might happen 0 0  Total GAD 7 Score 2 8

## 2019-01-17 ENCOUNTER — Ambulatory Visit: Payer: Self-pay

## 2019-01-17 NOTE — Lactation Note (Signed)
This note was copied from a baby's chart. Lactation Consultation Note  Patient Name: Whitney Lowe UNHRV'A Date: 01/17/2019 Reason for consult: NICU baby 15 is 54 weeks old and 34.3 PMA.  Mom states baby has been latching well.  Mom positioned baby in both football and cross cradle holds.  Baby fussy at breast.  Mom upset with baby crying and frequently gives baby pacifier.  Mom hand expressed drops of milk prior to latch attempt.  Baby latched only briefly for a few sucks but then started crying.  Baby acts very hungry.  Mom worried baby is not latching.  Discussed normal preterm feeding behavior.  Explained feedings will be inconsistent for a while.  Reassured that breastfeeding should improve as baby reaches term.  Mom is pumping every 2-3 hours and obtains 30-40 mls.  FOB present and supportive.  Maternal Data    Feeding Feeding Type: Breast Fed  LATCH Score Latch: Repeated attempts needed to sustain latch, nipple held in mouth throughout feeding, stimulation needed to elicit sucking reflex.  Audible Swallowing: None  Type of Nipple: Everted at rest and after stimulation  Comfort (Breast/Nipple): Soft / non-tender  Hold (Positioning): No assistance needed to correctly position infant at breast.  LATCH Score: 7  Interventions    Lactation Tools Discussed/Used     Consult Status Consult Status: PRN    [name] 01/17/2019, 2:25 PM

## 2019-01-17 NOTE — Lactation Note (Signed)
This note was copied from a baby's chart. Lactation Consultation Note  Patient Name: Whitney Lowe GEZMO'Q Date: 01/17/2019 Reason for consult: Follow-up assessment;Difficult latch P66, 26 week old premature female infant born at [place].  Per Nurse , infant is not longer on donor milk is being breast and formula  fed with 24 kcal special care RTF taking 38 mls per feeding NG and PO. Requesting [place] services to assist mom with latching infant to breast at the 2 pm feeding 01/17/2019. Mom could use guidance with latching preterm infant at breast, Nurse  noticed it takes infant awhile to latch and  he can easily have disorganized sucking pattern when agitated.  Mom need help with identifying an organized and disorganized  infant state.  Maternal Data    Feeding Feeding Type: Bottle Fed - Breast Milk  LATCH Score                   Interventions    Lactation Tools Discussed/Used     Consult Status Consult Status: Follow-up Date: 01/17/19 Follow-up type: In-patient    Vicente Serene 01/17/2019, 3:00 AM

## 2019-02-01 ENCOUNTER — Encounter: Payer: Medicaid Other | Admitting: Obstetrics & Gynecology

## 2019-02-01 ENCOUNTER — Other Ambulatory Visit: Payer: Self-pay

## 2019-02-01 ENCOUNTER — Encounter: Payer: Self-pay | Admitting: Family Medicine

## 2019-02-01 NOTE — Progress Notes (Deleted)
I connected with@ on 02/01/19 at 10:55 AM EDT by: *** and verified that I am speaking with the correct person using two identifiers.  Patient is located at *** and provider is located at ***.     The purpose of this virtual visit is to provide medical care while limiting exposure to the novel coronavirus. I discussed the limitations, risks, security and privacy concerns of performing an evaluation and management service by *** and the availability of in person appointments. I also discussed with the patient that there may be a patient responsible charge related to this service. By engaging in this virtual visit, you consent to the provision of healthcare.  Additionally, you authorize for your insurance to be billed for the services provided during this visit.  The patient expressed understanding and agreed to proceed.  The following staff members participated in the virtual visit:  ***  Post Partum Visit Note Subjective:    [name] is a 33 y.o. [phone] female who presents for a postpartum visit. She is {1-10:13787} {time; units:18646} postpartum following a {delivery:12449}. I have fully reviewed the prenatal and intrapartum course. The delivery was at *** gestational weeks. Outcome: {delivery outcome:32078}. Anesthesia: {anesthesia types:812}. Postpartum course has been ***. Baby's course has been ***. Baby is feeding by {breast/bottle:69}. Bleeding {vag bleed:12292}. Bowel function is {normal:32111}. Bladder function is {normal:32111}. Patient {is/is not:9024} sexually active. Contraception method is {contraceptive method:5051}. Postpartum depression screening: {neg default:13464::"negative"}.  {Common ambulatory SmartLinks:19316}  Review of Systems {ros; complete:30496}   Objective:  There were no vitals filed for this visit. Self-Obtained       Assessment:    *** postpartum exam. Pap smear not done at today's visit. Last pap smear *** and results were ***. Next pap due ***.    Plan:    1. Contraception: {method:5051} 2. *** 3. Follow up in: {1-10:13787} {time; units:19136} or as needed.   *** minutes of non-face-to-face time spent with the patient   Blanchard Mane 02/01/2019 10:41 AM

## 2019-02-01 NOTE — Progress Notes (Signed)
Attempted to call pt for virtual visit at 1045. No answer. Received error message stating VM box is not set up.   Second call at 1100 for virtual visit. No answer. Same error message received.  Will reschedule appt.  Apolonio Schneiders RN 02/01/19

## 2019-02-22 ENCOUNTER — Other Ambulatory Visit: Payer: Self-pay

## 2019-02-28 ENCOUNTER — Other Ambulatory Visit: Payer: Self-pay

## 2019-02-28 MED ORDER — STELARA 90 MG/ML ~~LOC~~ SOSY: PREFILLED_SYRINGE | SUBCUTANEOUS | 2 refills | Status: DC

## 2019-03-29 ENCOUNTER — Telehealth: Payer: Medicaid Other | Admitting: Student

## 2019-03-29 ENCOUNTER — Other Ambulatory Visit: Payer: Self-pay

## 2019-03-29 NOTE — Progress Notes (Signed)
Pt did not keep virtual appointment today

## 2019-03-29 NOTE — Progress Notes (Signed)
@  400pm VM Not working will attempt again @412pm  VM Not set up will try again in 61mns @420pm  VM Not set up ended the call

## 2019-03-30 IMAGING — CT CT ABD-PELV W/ CM
2 of 4 series · 16 of 46 positions shown, 18 images · IV contrast (iopamidol)
Comparison: None.

CLINICAL DATA: Bloody stool

EXAM:
CT ABDOMEN AND PELVIS WITH CONTRAST
TECHNIQUE: Multidetector CT imaging of the abdomen and pelvis was performed
using the standard protocol following bolus administration of
intravenous contrast.
CONTRAST:  80mL N6WXCQ-KOO IOPAMIDOL (N6WXCQ-KOO) INJECTION 61%

[Series 3: axial st · axial · 0.56mm/px · z∈[-573,-238]mm · 13 of 75 slices shown, 15 images]
[im 4/75  soft-tissue]
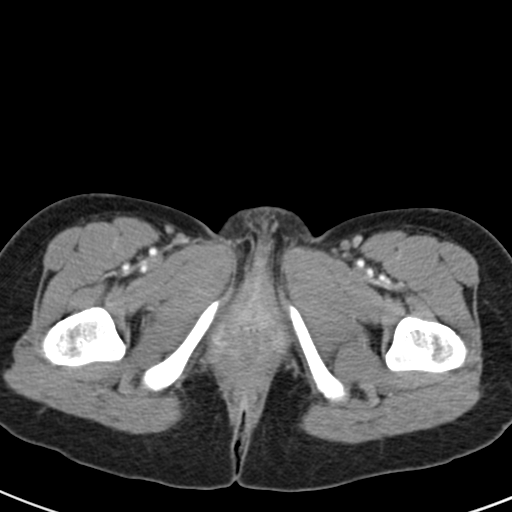
[im 4/75  bone]
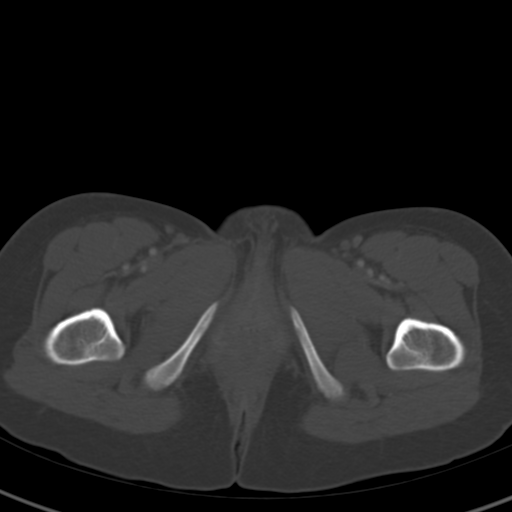
[im 12/75  soft-tissue]
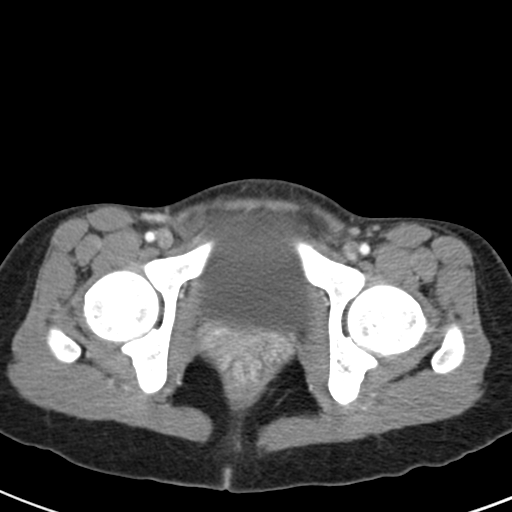
[im 16/75  soft-tissue]
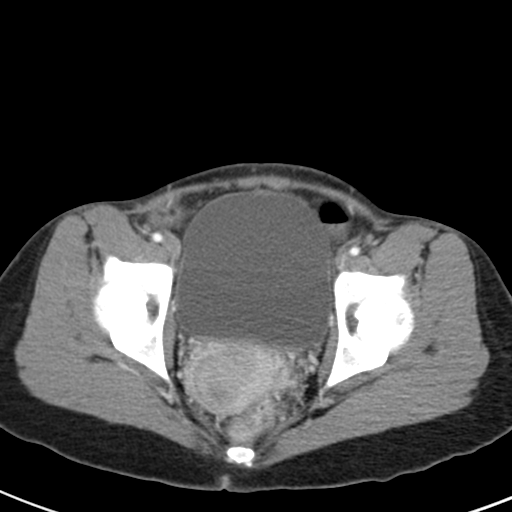
[im 20/75  soft-tissue]
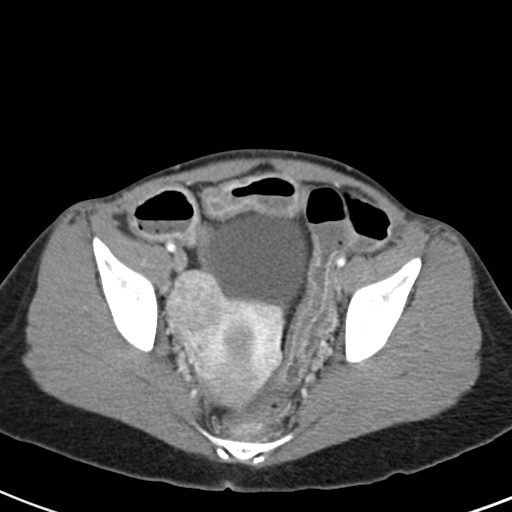
[im 28/75  soft-tissue]
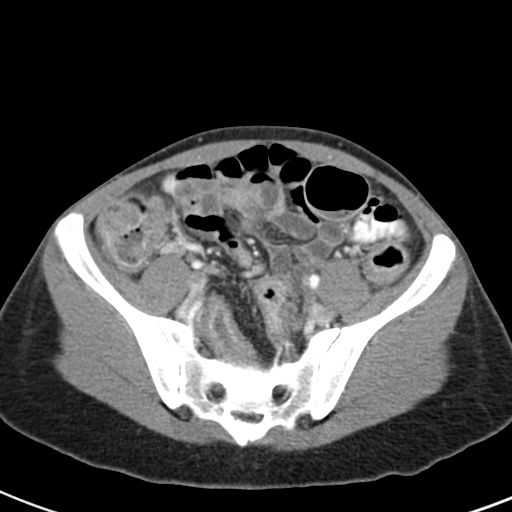
[im 32/75  soft-tissue]
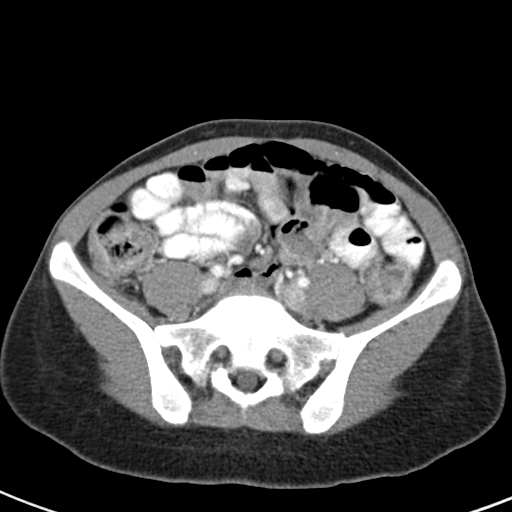
[im 39/75  soft-tissue]
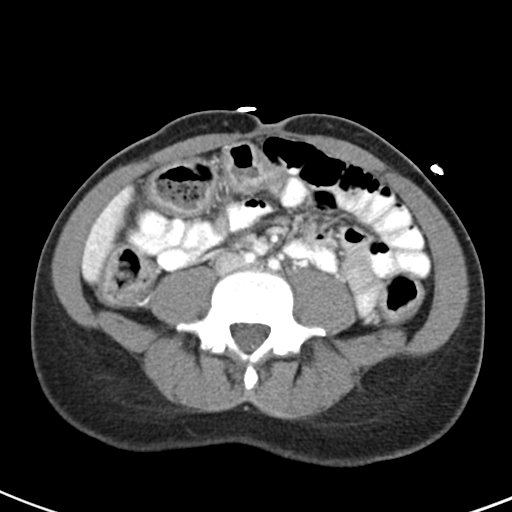
[im 43/75  soft-tissue]
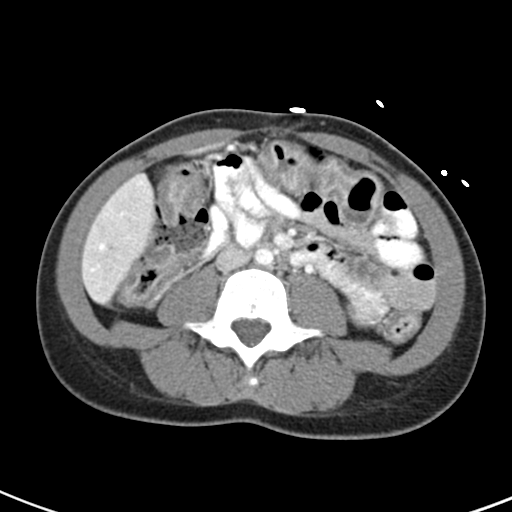
[im 47/75  soft-tissue]
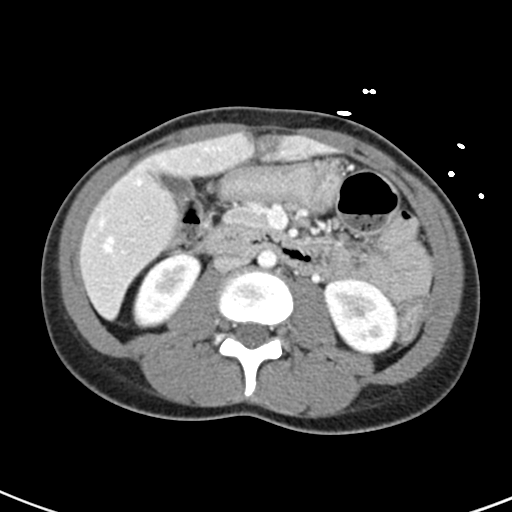
[im 47/75  bone]
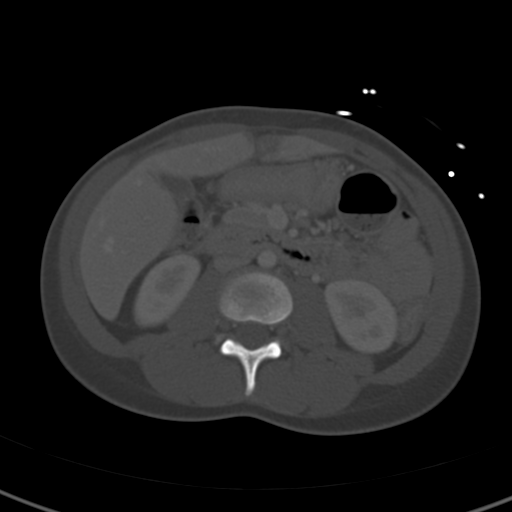
[im 55/75  soft-tissue]
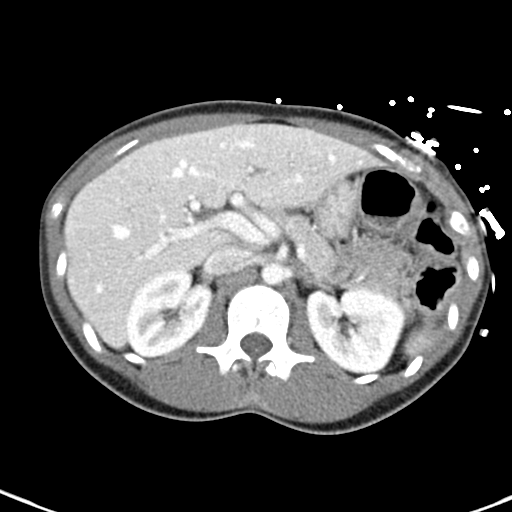
[im 59/75  soft-tissue]
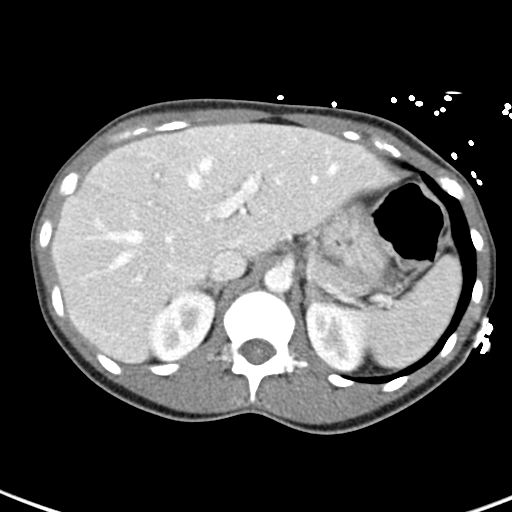
[im 63/75  soft-tissue]
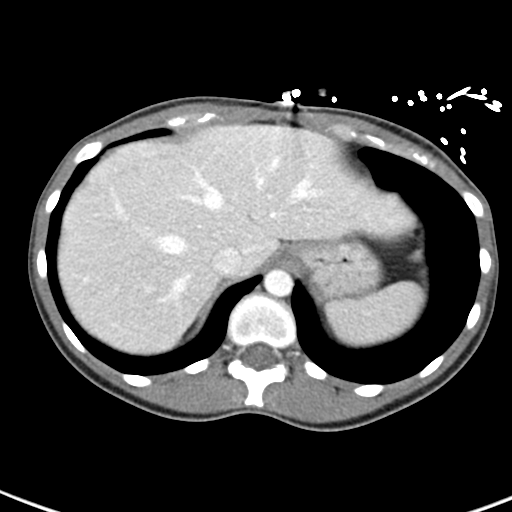
[im 71/75  soft-tissue]
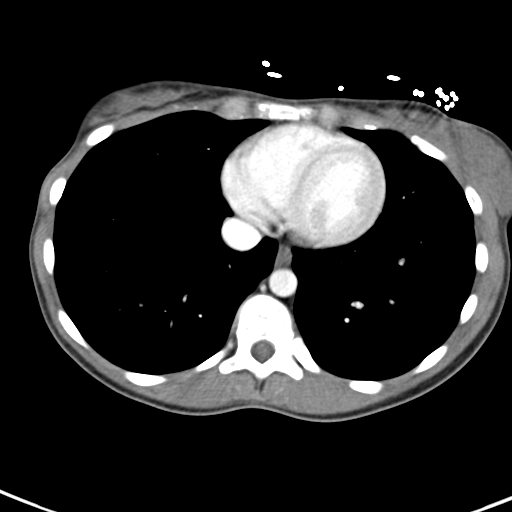

[Series 5: coronal st · coronal · 0.54mm/px · 3 of 71 slices shown]
[im 24/71  soft-tissue]
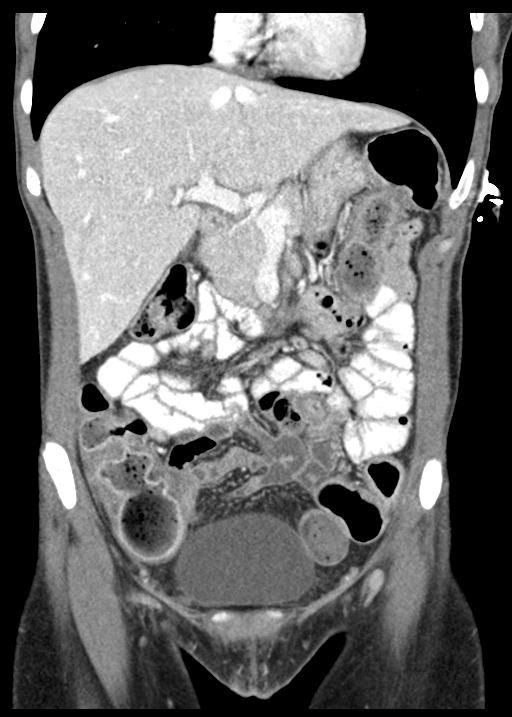
[im 32/71  soft-tissue]
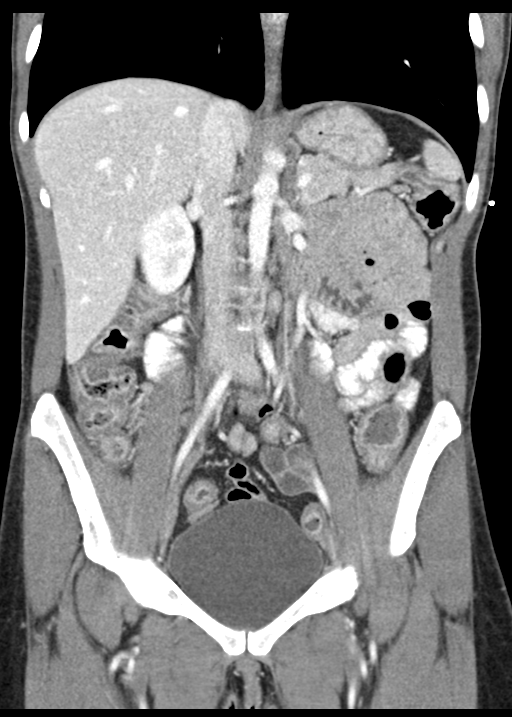
[im 39/71  soft-tissue]
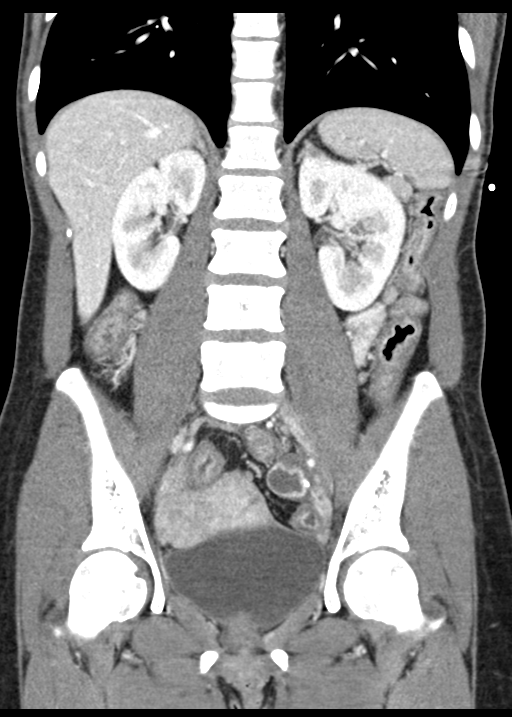

[16 of 46 positions shown; findings below may reference images not displayed]

FINDINGS: Lower chest: No acute abnormality.

Hepatobiliary: Probable focal fat infiltration near the falciform
ligament. No calcified gallstones or biliary dilatation

Pancreas: Unremarkable. No pancreatic ductal dilatation or
surrounding inflammatory changes.

Spleen: Normal in size without focal abnormality.

Adrenals/Urinary Tract: Adrenal glands are unremarkable. Kidneys are
normal, without renal calculi, focal lesion, or hydronephrosis.
Bladder is unremarkable.

Stomach/Bowel: Stomach is nonenlarged. No dilated small bowel. Wall
thickening and mucosal enhancement involving long segment of
distal/terminal ileum. Diffuse wall thickening of the colon with
mucosal enhancement.

Vascular/Lymphatic: Nonaneurysmal aorta. No significantly enlarged
lymph nodes

Reproductive: Uterus contains multiple enhancing masses, for example
right fundal mass measures 3 cm, right posterior myometrial
exophytic mass measures 3 cm. No adnexal mass.

Other: Negative for free air or free fluid.

Musculoskeletal: No acute or significant osseous findings.
IMPRESSION: 1. Long segment wall thickening and mucosal enhancement involving
the distal/terminal ileum with diffuse wall thickening and mucosal
enhancement of the colon, consistent with ileitis and colitis, in
keeping with the history of inflammatory bowel disease. No
obstruction identified.
2. Fibroid uterus

## 2019-05-04 NOTE — Telephone Encounter (Signed)
Patient is calling to follow up on medication request.

## 2019-05-12 ENCOUNTER — Other Ambulatory Visit: Payer: Self-pay

## 2019-05-12 ENCOUNTER — Encounter: Payer: Self-pay | Admitting: Nurse Practitioner

## 2019-05-12 ENCOUNTER — Ambulatory Visit (INDEPENDENT_AMBULATORY_CARE_PROVIDER_SITE_OTHER): Payer: Medicaid Other | Admitting: Nurse Practitioner

## 2019-05-12 ENCOUNTER — Other Ambulatory Visit (INDEPENDENT_AMBULATORY_CARE_PROVIDER_SITE_OTHER): Payer: Medicaid Other

## 2019-05-12 VITALS — BP 104/60 | HR 95 | Temp 98.2°F | Ht <= 58 in | Wt 124.0 lb

## 2019-05-12 DIAGNOSIS — K50919 Crohn's disease, unspecified, with unspecified complications: Secondary | ICD-10-CM | POA: Diagnosis not present

## 2019-05-12 LAB — CBC
HCT: 42.2 % (ref 36.0–46.0)
Hemoglobin: 14 g/dL (ref 12.0–15.0)
MCHC: 33 g/dL (ref 30.0–36.0)
MCV: 85.1 fl (ref 78.0–100.0)
Platelets: 496 10*3/uL — ABNORMAL HIGH (ref 150.0–400.0)
RBC: 4.96 Mil/uL (ref 3.87–5.11)
RDW: 14.7 % (ref 11.5–15.5)
WBC: 8 10*3/uL (ref 4.0–10.5)

## 2019-05-12 LAB — HIGH SENSITIVITY CRP: CRP, High Sensitivity: 8.85 mg/L — ABNORMAL HIGH (ref 0.000–5.000)

## 2019-05-12 NOTE — Patient Instructions (Addendum)
If you are age 35 or older, your body mass index should be between 23-30. Your Body mass index is 25.92 kg/m. If this is out of the aforementioned range listed, please consider follow up with your Primary Care Provider.  If you are age 26 or younger, your body mass index should be between 19-25. Your Body mass index is 25.92 kg/m. If this is out of the aformentioned range listed, please consider follow up with your Primary Care Provider.   Your provider has requested that you go to the basement level for lab work before leaving today. Press "B" on the elevator. The lab is located at the first door on the left as you exit the elevator.  We will call you with results.    Follow up with Dr Hilarie Fredrickson on 06/21/19 at 3:20 pm.  Thank you for choosing me and [facility].   Tye Savoy, NP

## 2019-05-12 NOTE — Progress Notes (Signed)
IMPRESSION and PLAN:   Patient is a 34 yo female with history ileocolonic Crohn's disease diagnosed 2018, history of erythema nodosum, c-difficile infection,  ADHD.   Ileocolonic Crohn's disease diagnosed in 2018. Was in remission on Stelara but having recurrent symptoms three weeks past due for injection ( insurance problems).  -bilateral hip pain, increased frequency of stool with blood and mucous. Surely symptoms all secondary to Crohn's. Complains of terrible fatigue.  -cbc, crp.  -TSH within the last year was okay -she should be getting Stelara within a few days. Advised to take next injection as soon as she can.  -asked her to reapply with [employer] patient assistance as she is losing Medicaid in May and will not be able to pay for medication. I will see if Dr. Vena Rua nurse has any insight into the program and can assist with acquisition of drug. -follow up with Dr. Hilarie Fredrickson in February. Call in the interim for questions / problems.       HPI:    Primary GI:  Dr. Hilarie Fredrickson  Chief complaint :  Crohn's disease   Whitney Lowe is a 34 yo female with PMH as above. We have followed her for ileocolonic Crohn's disease diagnosed in [place] in 2018. She came under our care after a hospitalization in March 2019 for C-diff infection / worsening Crohn's symptoms. 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 Drug was stopped, she was treated with steroids pending decision about which biologic to start next. However, at some point our practice became out of network for her. She established care with Dr. Karlton Lemon at the [facility] of [place] and was started on Stelara.   In April 2020 patient was able to reestablish care with [place]. We had a Telehealth visit , patient was [redacted] weeks pregnant at the time. We continued Stelara, Crohn's disease seemed to be in remission at the time.    09/08/18 - last office visit. She was having some rectal bleeding, felt to likely be  hemorrhoidal. Here for follow up and has some questions. Unfortunately there have been some recent insurance problems with obtaining Stelara. Her last injection was early November, she is about 3 weeks overdue for an injection and already having increased frequency of BMs with some blood and mucous, bilateral hip pain. She has terrible fatigue but this isn't new.  She will lose Medicaid in May and concerned about how to get Stelara going forward. She was previously declined by [employer] patient assistance.   Review of systems:     No chest pain, no SOB, no fevers, no urinary sx   Past Medical History:  Diagnosis Date  . ADHD    stopped abilify with preg  . ADHD (attention deficit hyperactivity disorder)   . Anemia   . Anxiety    not currently on meds  . Cataract    Rt eye  . Complication of anesthesia    became combative   . Crohn's ileocolitis ([place]) 06/19/2016  . Eczema   . Fibroids    uterine  . Infection    UTI  . Ovarian cyst     Patient's surgical history, family medical history, social history, medications and allergies were all reviewed in Epic   Creatinine clearance cannot be calculated (Patient's most recent lab result is older than the maximum 21 days allowed.)  Current Outpatient Medications  Medication Sig Dispense Refill  . acetaminophen (TYLENOL) 500 MG tablet Take 1,000 mg by  mouth every 6 (six) hours as needed.    Marland Kitchen ibuprofen (ADVIL) 800 MG tablet Take 1 tablet (800 mg total) by mouth every 8 (eight) hours as needed. 30 tablet 0  . phenylephrine (,USE FOR PREPARATION-H,) 0.25 % suppository Place 1 suppository rectally 2 (two) times daily as needed for hemorrhoids.    . Prenatal MV-Min-FA-Omega-3 (PRENATAL GUMMIES/DHA & FA PO) Take 2 each by mouth daily.    . ustekinumab (STELARA) 90 MG/ML SOSY injection ADMINISTER 1 ML(90 MG) UNDER THE SKIN EVERY 8 WEEKS 1 mL 2   No current facility-administered medications for this visit.    Physical Exam:     BP 104/60    Pulse 95   Temp 98.2 F (36.8 C)   Ht 4' 10"  (1.473 m)   Wt 124 lb (56.2 kg)   BMI 25.92 kg/m   GENERAL:  Pleasant female in NAD PSYCH: : Cooperative, normal affect EENT:  conjunctiva pink, mucous membranes moist, neck supple without masses CARDIAC:  RRR, no murmur heard, no peripheral edema PULM: Normal respiratory effort, lungs CTA bilaterally, no wheezing ABDOMEN:  Nondistended, soft, mild diffuse tenderness. No obvious masses, no hepatomegaly,  normal bowel sounds SKIN:  turgor, no lesions seen Musculoskeletal:  Normal muscle tone, normal strength NEURO: Alert and oriented x 3, no focal neurologic deficits  I spent 25 minutes of face-to-face time with the patient. Greater than 50% of the time was spent counseling and coordinating care. Questions answered  Tye Savoy , NP 05/12/2019, 3:58 PM

## 2019-05-13 ENCOUNTER — Encounter: Payer: Self-pay | Admitting: Nurse Practitioner

## 2019-05-16 NOTE — Progress Notes (Signed)
Addendum: Reviewed and agree with assessment and management plan. Once her insurance changes she will definitely need to re-apply for drug assistance and we can help here at that time. Kimberleigh Mehan, Lajuan Lines, MD

## 2019-06-21 ENCOUNTER — Ambulatory Visit: Payer: Medicaid Other | Admitting: Internal Medicine

## 2019-06-21 ENCOUNTER — Encounter: Payer: Self-pay | Admitting: Internal Medicine

## 2019-06-21 ENCOUNTER — Other Ambulatory Visit: Payer: Self-pay

## 2019-06-21 ENCOUNTER — Other Ambulatory Visit (INDEPENDENT_AMBULATORY_CARE_PROVIDER_SITE_OTHER): Payer: Medicaid Other

## 2019-06-21 VITALS — BP 90/62 | HR 76 | Temp 97.5°F | Ht <= 58 in | Wt 127.0 lb

## 2019-06-21 DIAGNOSIS — R1013 Epigastric pain: Secondary | ICD-10-CM | POA: Diagnosis not present

## 2019-06-21 DIAGNOSIS — K50811 Crohn's disease of both small and large intestine with rectal bleeding: Secondary | ICD-10-CM | POA: Diagnosis not present

## 2019-06-21 DIAGNOSIS — R110 Nausea: Secondary | ICD-10-CM

## 2019-06-21 DIAGNOSIS — K50019 Crohn's disease of small intestine with unspecified complications: Secondary | ICD-10-CM | POA: Diagnosis not present

## 2019-06-21 LAB — COMPREHENSIVE METABOLIC PANEL
ALT: 6 U/L (ref 0–35)
AST: 12 U/L (ref 0–37)
Albumin: 3.7 g/dL (ref 3.5–5.2)
Alkaline Phosphatase: 68 U/L (ref 39–117)
BUN: 12 mg/dL (ref 6–23)
CO2: 30 mEq/L (ref 19–32)
Calcium: 9.5 mg/dL (ref 8.4–10.5)
Chloride: 101 mEq/L (ref 96–112)
Creatinine, Ser: 0.8 mg/dL (ref 0.40–1.20)
GFR: 98.69 mL/min (ref >60.00)
Glucose, Bld: 81 mg/dL (ref 70–99)
Potassium: 4.2 mEq/L (ref 3.5–5.1)
Sodium: 136 mEq/L (ref 135–145)
Total Bilirubin: 0.2 mg/dL (ref 0.2–1.2)
Total Protein: 7.6 g/dL (ref 6.0–8.3)

## 2019-06-21 LAB — CBC WITH DIFFERENTIAL/PLATELET
Basophils Absolute: 0.1 10*3/uL (ref 0.0–0.1)
Basophils Relative: 0.7 % (ref 0.0–3.0)
Eosinophils Absolute: 0.4 10*3/uL (ref 0.0–0.7)
Eosinophils Relative: 5.4 % — ABNORMAL HIGH (ref 0.0–5.0)
HCT: 41.2 % (ref 36.0–46.0)
Hemoglobin: 13.6 g/dL (ref 12.0–15.0)
Lymphocytes Relative: 46.7 % — ABNORMAL HIGH (ref 12.0–46.0)
Lymphs Abs: 3.6 10*3/uL (ref 0.7–4.0)
MCHC: 32.9 g/dL (ref 30.0–36.0)
MCV: 87.3 fl (ref 78.0–100.0)
Monocytes Absolute: 0.5 10*3/uL (ref 0.1–1.0)
Monocytes Relative: 6.4 % (ref 3.0–12.0)
Neutro Abs: 3.1 10*3/uL (ref 1.4–7.7)
Neutrophils Relative %: 40.8 % — ABNORMAL LOW (ref 43.0–77.0)
Platelets: 407 10*3/uL — ABNORMAL HIGH (ref 150.0–400.0)
RBC: 4.72 Mil/uL (ref 3.87–5.11)
RDW: 14.8 % (ref 11.5–15.5)
WBC: 7.6 10*3/uL (ref 4.0–10.5)

## 2019-06-21 LAB — IBC + FERRITIN
Ferritin: 18.2 ng/mL (ref 10.0–291.0)
Iron: 45 ug/dL (ref 42–145)
Saturation Ratios: 13.4 % — ABNORMAL LOW (ref 20.0–50.0)
Transferrin: 240 mg/dL (ref 212.0–360.0)

## 2019-06-21 LAB — FERRITIN: Ferritin: 18.2 ng/mL (ref 10.0–291.0)

## 2019-06-21 LAB — VITAMIN B12: Vitamin B-12: 527 pg/mL (ref 211–911)

## 2019-06-21 NOTE — Patient Instructions (Addendum)
Your provider has requested that you go to the basement level for lab work before leaving today. Press "B" on the elevator. The lab is located at the first door on the left as you exit the elevator. ______________________________________________________________ Please follow up with Dr Hilarie Fredrickson on 08/16/19 at 2:30 pm  ______________________________________________________________ Continue Ensure and other appetite stimulants. ______________________________________________________________ We have sent the following medications to your pharmacy for you to pick up at your convenience: Pantoprazole 40 mg daily ______________________________________________________________ Hanley Hays. _____________________________________________________________ Dennis Bast have been scheduled for an MR enterography of abdomen at [facility] on Friday, 07/01/19. Your appointment time is 5:00 pm. Please arrive at 3:30 pm for the registration purposes and prep. Please make certain not to have anything to eat or drink 6 hours prior to your test. In addition, if you have any metal in your body, have a pacemaker or defibrillator, please be sure to let your ordering physician know. This test typically takes 45 minutes to 1 hour to complete. Should you need to reschedule, please call [phone] to do so.  _____________________________________________________________ If you are age 68 or older, your body mass index should be between 23-30. Your Body mass index is 26.54 kg/m. If this is out of the aforementioned range listed, please consider follow up with your Primary Care Provider.  If you are age 78 or younger, your body mass index should be between 19-25. Your Body mass index is 26.54 kg/m. If this is out of the aformentioned range listed, please consider follow up with your Primary Care Provider.  _____________________________________________________ Due to recent changes in healthcare laws, you may see the results  of your imaging and laboratory studies on MyChart before your provider has had a chance to review them.  We understand that in some cases there may be results that are confusing or concerning to you. Not all laboratory results come back in the same time frame and the provider may be waiting for multiple results in order to interpret others.  Please give [place] 48 hours in order for your provider to thoroughly review all the results before contacting the office for clarification of your results.

## 2019-06-21 NOTE — Progress Notes (Signed)
Subjective:    Patient ID: Whitney Lowe, female    DOB: 02/12/1986, 34 y.o.   MRN: [id number]  HPI Whitney Lowe is a 34 year old female with a history of ileocolonic Crohn's disease diagnosed in March 2018, history of erythema nodosum, history of C. difficile colitis, ADHD, uterine fibroids who is here for follow-up.  She is here alone today and was last seen on 05/12/2019 by Tye Savoy, NP.  She had been without her Stelara therapy for more than a month and she has had return of abdominal pain, loose stools, fatigue.  She did resume Stelara therapy and took her last injection on 05/06/2019.  She reports that she has had significant fatigue throughout the day.  She is only sleeping about 5 hours per night which is not atypical for her.  She can have trouble going to sleep which she relates to her ADHD.  She is having burning abdominal pain in the epigastrium and upper abdomen.  There is also cramping in her abdomen which she will have to "breathe through".  Her appetite has dwindled but she is trying to hang onto weight which she was able to gain during her pregnancy.  Her weight has fluctuated.  She is drinking Ensure to help boost her protein intake.  Eating definitively makes her burning abdominal pain worse.  Bowel movements have been looser than normal now 4-5 times per day where normally she will have 1-2 bowel movements which are mostly formed.  She occasionally sees blood with wiping but she attributes this to hemorrhoids.  She has had increased mucus in her stools.  She does have nausea worse with eating.  No vomiting.  She stopped breast-feeding on 06/06/2019.   Review of Systems As per HPI, otherwise negative  Current Medications, Allergies, Past Medical History, Past Surgical History, Family History and Social History were reviewed in [employer] record.     Objective:   Physical Exam BP 90/62   Pulse 76   Temp (!) 97.5 F (36.4 C)   Ht 4' 10"  (1.473 m)    Wt 127 lb (57.6 kg)   LMP 06/06/2019 (Approximate)   BMI 26.54 kg/m  Gen: awake, alert, NAD HEENT: anicteric, op clear CV: RRR, no mrg Pulm: CTA b/l Abd: soft, epigastric tenderness without rebound or guarding, ND, +BS throughout Ext: no c/c/e Neuro: nonfocal  CRP elevated when seen by Nevin Bloodgood at 8.85 Hemoglobin in January 21 was normal at 14.0, white count 8.0, platelet count 496     Assessment & Plan:  34 year old female with a history of ileocolonic Crohn's disease diagnosed in March 2018, history of erythema nodosum, history of C. difficile colitis, ADHD, uterine fibroids who is here for follow-up.  1.  Ileocolonic Crohn's disease/nausea/abdominal pain/loose stools --she had a lapse in her Stelara therapy but did take her last dose nearly 8 weeks ago.  She is having signs and symptoms of active Crohn's disease and I recommended that we evaluate this objectively and then decide on therapy. --MR enterography --CBC, CMP, ferritin, IBC panel, B12, vitamin D --Check Stelara level and antibody as we are very near trough --Begin pantoprazole 40 mg a day to try to help the burning epigastric abdominal pain --Follow-up in 8 weeks time --Depending on MR enterography may consider prednisone versus budesonide will also need to follow-up Stelara level and antibody to ensure no antibody formation.  If good level and no antibody may need to consider the addition of immunomodulator therapy  30 minutes total spent  today including patient facing time, coordination of care, reviewing medical history/procedures/pertinent radiology studies, and documentation of the encounter.

## 2019-06-23 ENCOUNTER — Telehealth: Payer: Self-pay | Admitting: *Deleted

## 2019-06-23 MED ORDER — PANTOPRAZOLE SODIUM 40 MG PO TBEC: 40.0000 mg | DELAYED_RELEASE_TABLET | Freq: Every day | ORAL | 5 refills | Status: DC

## 2019-06-23 NOTE — Telephone Encounter (Signed)
rx sent

## 2019-06-24 LAB — VITAMIN D 1,25 DIHYDROXY
Vitamin D 1, 25 (OH)2 Total: 49 pg/mL (ref 18–72)
Vitamin D2 1, 25 (OH)2: <8 pg/mL
Vitamin D3 1, 25 (OH)2: 49 pg/mL

## 2019-06-27 ENCOUNTER — Other Ambulatory Visit: Payer: Self-pay | Admitting: Internal Medicine

## 2019-06-27 DIAGNOSIS — K50811 Crohn's disease of both small and large intestine with rectal bleeding: Secondary | ICD-10-CM

## 2019-06-30 ENCOUNTER — Telehealth: Payer: Self-pay | Admitting: Internal Medicine

## 2019-07-01 ENCOUNTER — Ambulatory Visit (HOSPITAL_COMMUNITY): Admission: RE | Admit: 2019-07-01 | Payer: Medicaid Other | Source: Ambulatory Visit

## 2019-07-04 ENCOUNTER — Telehealth: Payer: Self-pay | Admitting: *Deleted

## 2019-07-04 NOTE — Telephone Encounter (Signed)
-----   Message from Darden Dates sent at 07/04/2019  8:13 AM EDT ----- Regarding: authorization for MR Damita Dunnings, Authorization has been obtained.  She can now be rescheduled now at [facility]. Thank you, Amy

## 2019-07-04 NOTE — Telephone Encounter (Signed)
Patient's MR enterography abd/pelvis has been rescheduled to [facility] on Monday, 07/11/19 at 7:00 pm, 6:30 pm arrival. NPO 4 hours prior. I have contacted patient to advise her of this information and she verbalizes understanding of this.

## 2019-07-11 ENCOUNTER — Encounter (HOSPITAL_COMMUNITY): Payer: Self-pay

## 2019-07-11 ENCOUNTER — Ambulatory Visit (HOSPITAL_COMMUNITY): Payer: Medicaid Other

## 2019-07-11 ENCOUNTER — Ambulatory Visit (HOSPITAL_COMMUNITY): Admission: RE | Admit: 2019-07-11 | Payer: Medicaid Other | Source: Ambulatory Visit

## 2019-07-21 ENCOUNTER — Ambulatory Visit (HOSPITAL_COMMUNITY)
Admission: RE | Admit: 2019-07-21 | Discharge: 2019-07-21 | Disposition: A | Discharge status: Home / Self Care | Payer: Medicaid Other | Source: Ambulatory Visit | Attending: Internal Medicine | Admitting: Internal Medicine

## 2019-07-21 ENCOUNTER — Encounter (HOSPITAL_COMMUNITY): Payer: Self-pay

## 2019-07-21 ENCOUNTER — Other Ambulatory Visit: Payer: Self-pay

## 2019-07-21 DIAGNOSIS — K50811 Crohn's disease of both small and large intestine with rectal bleeding: Secondary | ICD-10-CM

## 2019-07-21 DIAGNOSIS — K5090 Crohn's disease, unspecified, without complications: Secondary | ICD-10-CM | POA: Diagnosis not present

## 2019-07-21 MED ORDER — GADOBUTROL 1 MMOL/ML IV SOLN
6.0000 mL | Freq: Once | INTRAVENOUS | Status: AC | PRN
  Administered 2019-07-21: 21:00:00 6 mL via INTRAVENOUS

## 2019-07-26 ENCOUNTER — Other Ambulatory Visit: Payer: Self-pay | Admitting: Internal Medicine

## 2019-07-26 DIAGNOSIS — K50811 Crohn's disease of both small and large intestine with rectal bleeding: Secondary | ICD-10-CM

## 2019-07-26 MED ORDER — BUDESONIDE 3 MG PO CPEP: 9.0000 mg | ORAL_CAPSULE | Freq: Every day | ORAL | 1 refills | Status: DC

## 2019-07-26 NOTE — Progress Notes (Signed)
tp

## 2019-08-02 NOTE — Telephone Encounter (Signed)
Budesonide has led to mood swings and anxiety which can certainly happen with steroid therapy but should happen less with budesonide then prednisone The new medicine that we need to add is azathioprine but she needs to come for her TPMT level before this medicine can be started I would recommend she reduce the Entocort to 6 mg daily and then let me know after about a week if her anxiousness, mood swings have improved. Certainly if they significantly worsen then stop altogether and let me know immediately

## 2019-08-16 ENCOUNTER — Ambulatory Visit: Payer: Medicaid Other | Admitting: Internal Medicine

## 2019-09-01 ENCOUNTER — Other Ambulatory Visit: Payer: Medicaid Other

## 2019-09-01 DIAGNOSIS — K50811 Crohn's disease of both small and large intestine with rectal bleeding: Secondary | ICD-10-CM

## 2019-09-01 NOTE — Addendum Note (Signed)
Addended by: Isaiah Serge D on: 09/01/2019 08:44 AM   Modules accepted: Orders

## 2019-09-09 LAB — TPMT GENETIC TEST

## 2019-09-12 ENCOUNTER — Telehealth: Payer: Self-pay

## 2019-09-12 ENCOUNTER — Other Ambulatory Visit: Payer: Self-pay

## 2019-09-12 DIAGNOSIS — K50811 Crohn's disease of both small and large intestine with rectal bleeding: Secondary | ICD-10-CM

## 2019-09-12 MED ORDER — AZATHIOPRINE 50 MG PO TABS: 50.0000 mg | ORAL_TABLET | Freq: Every day | ORAL | 6 refills | Status: DC

## 2019-09-12 MED ORDER — STELARA 90 MG/ML ~~LOC~~ SOSY: PREFILLED_SYRINGE | SUBCUTANEOUS | 2 refills | Status: DC

## 2019-09-12 NOTE — Telephone Encounter (Signed)
Pt notified via mychart, she has an OV scheduled.

## 2019-09-12 NOTE — Telephone Encounter (Signed)
I have spoken with Whitney Lowe and we have refilled Stelara which she should receive very soon, would expect in the next 1 to 2 days Would ensure that she is on Entocort 6 mg daily, she could not tolerate 9 mg daily She has recently started azathioprine She should follow-up with me in the office

## 2019-09-14 NOTE — Telephone Encounter (Signed)
Yes she would take azathioprine in addition to biologic therapy and budesonide She will need CBC and CMP 2 weeks after starting azathioprine

## 2019-09-21 ENCOUNTER — Ambulatory Visit: Payer: Medicaid Other | Admitting: Internal Medicine

## 2019-09-26 NOTE — Telephone Encounter (Signed)
Patient has been treated with budesonide, Stelara and newly started azathioprine for severe Crohn's disease Please remind her that she needs to have a CBC, CMP and add a lipase within 14 days of starting azathioprine.  If it has been 14 days on azathioprine she should come for these labs ASAP Please schedule her an appointment with me on 10/10/2018 1 in the morning, slots are currently held and one of the slots can be opened for her office visit Thanks JMP

## 2019-10-10 ENCOUNTER — Ambulatory Visit: Payer: Medicaid Other | Admitting: Internal Medicine

## 2019-10-31 ENCOUNTER — Other Ambulatory Visit: Payer: Self-pay

## 2019-10-31 ENCOUNTER — Telehealth: Payer: Self-pay | Admitting: Internal Medicine

## 2019-10-31 MED ORDER — STELARA 90 MG/ML ~~LOC~~ SOSY: PREFILLED_SYRINGE | SUBCUTANEOUS | 2 refills | Status: DC

## 2019-10-31 NOTE — Telephone Encounter (Signed)
Refill sent to pharmacy and pt aware.

## 2019-11-02 ENCOUNTER — Other Ambulatory Visit: Payer: Self-pay

## 2019-11-02 MED ORDER — ONDANSETRON 4 MG PO TBDP: ORAL_TABLET | ORAL | 0 refills | Status: DC

## 2019-11-02 NOTE — Telephone Encounter (Signed)
Ok for zofran ODT 4 mg every 6-8 hrs PRN nausea She needs an OV, next available, for continuity.  I believe she canceled her last OV

## 2019-11-03 ENCOUNTER — Telehealth: Payer: Self-pay | Admitting: Internal Medicine

## 2019-11-03 NOTE — Telephone Encounter (Signed)
Dina from [employer] is checking to see if a pre authorization request has been received stelara

## 2019-11-03 NOTE — Telephone Encounter (Signed)
Called [facility] and let them know claim was denied by [place] as pt had healthy blue now. Pharmacy to refax PA info.

## 2019-11-24 DIAGNOSIS — F310 Bipolar disorder, current episode hypomanic: Secondary | ICD-10-CM | POA: Diagnosis not present

## 2019-12-15 ENCOUNTER — Other Ambulatory Visit: Payer: Self-pay

## 2019-12-15 MED ORDER — ONDANSETRON 4 MG PO TBDP: ORAL_TABLET | ORAL | 0 refills | Status: DC

## 2020-01-16 ENCOUNTER — Ambulatory Visit (INDEPENDENT_AMBULATORY_CARE_PROVIDER_SITE_OTHER): Payer: Medicaid Other | Admitting: Internal Medicine

## 2020-01-16 ENCOUNTER — Other Ambulatory Visit (INDEPENDENT_AMBULATORY_CARE_PROVIDER_SITE_OTHER): Payer: Medicaid Other

## 2020-01-16 ENCOUNTER — Encounter: Payer: Self-pay | Admitting: Internal Medicine

## 2020-01-16 VITALS — BP 92/62 | HR 104 | Ht <= 58 in | Wt 114.2 lb

## 2020-01-16 DIAGNOSIS — Z7960 Long term (current) use of unspecified immunomodulators and immunosuppressants: Secondary | ICD-10-CM

## 2020-01-16 DIAGNOSIS — R110 Nausea: Secondary | ICD-10-CM

## 2020-01-16 DIAGNOSIS — Z79899 Other long term (current) drug therapy: Secondary | ICD-10-CM

## 2020-01-16 DIAGNOSIS — R630 Anorexia: Secondary | ICD-10-CM | POA: Diagnosis not present

## 2020-01-16 DIAGNOSIS — K5000 Crohn's disease of small intestine without complications: Secondary | ICD-10-CM

## 2020-01-16 DIAGNOSIS — K5900 Constipation, unspecified: Secondary | ICD-10-CM | POA: Diagnosis not present

## 2020-01-16 LAB — COMPREHENSIVE METABOLIC PANEL
ALT: 6 U/L (ref 0–35)
AST: 11 U/L (ref 0–37)
Albumin: 3.9 g/dL (ref 3.5–5.2)
Alkaline Phosphatase: 55 U/L (ref 39–117)
BUN: 12 mg/dL (ref 6–23)
CO2: 28 mEq/L (ref 19–32)
Calcium: 9.1 mg/dL (ref 8.4–10.5)
Chloride: 104 mEq/L (ref 96–112)
Creatinine, Ser: 0.87 mg/dL (ref 0.40–1.20)
GFR: 89.29 mL/min (ref >60.00)
Glucose, Bld: 80 mg/dL (ref 70–99)
Potassium: 4 mEq/L (ref 3.5–5.1)
Sodium: 137 mEq/L (ref 135–145)
Total Bilirubin: 0.2 mg/dL (ref 0.2–1.2)
Total Protein: 7.5 g/dL (ref 6.0–8.3)

## 2020-01-16 LAB — CBC WITH DIFFERENTIAL/PLATELET
Basophils Absolute: 0.1 10*3/uL (ref 0.0–0.1)
Basophils Relative: 0.7 % (ref 0.0–3.0)
Eosinophils Absolute: 0.4 10*3/uL (ref 0.0–0.7)
Eosinophils Relative: 5.2 % — ABNORMAL HIGH (ref 0.0–5.0)
HCT: 40 % (ref 36.0–46.0)
Hemoglobin: 13.4 g/dL (ref 12.0–15.0)
Lymphocytes Relative: 46.2 % — ABNORMAL HIGH (ref 12.0–46.0)
Lymphs Abs: 3.4 10*3/uL (ref 0.7–4.0)
MCHC: 33.4 g/dL (ref 30.0–36.0)
MCV: 86.3 fl (ref 78.0–100.0)
Monocytes Absolute: 0.6 10*3/uL (ref 0.1–1.0)
Monocytes Relative: 8 % (ref 3.0–12.0)
Neutro Abs: 3 10*3/uL (ref 1.4–7.7)
Neutrophils Relative %: 39.9 % — ABNORMAL LOW (ref 43.0–77.0)
Platelets: 488 10*3/uL — ABNORMAL HIGH (ref 150.0–400.0)
RBC: 4.63 Mil/uL (ref 3.87–5.11)
RDW: 14.7 % (ref 11.5–15.5)
WBC: 7.4 10*3/uL (ref 4.0–10.5)

## 2020-01-16 LAB — C-REACTIVE PROTEIN: CRP: <1 mg/dL (ref 0.5–20.0)

## 2020-01-16 MED ORDER — PLENVU 140 G PO SOLR: 1.0000 | ORAL | 0 refills | Status: DC

## 2020-01-16 NOTE — Progress Notes (Signed)
Subjective:    Patient ID: Whitney Lowe, female    DOB: 03-03-1986, 34 y.o.   MRN: [id number]  HPI Whitney Lowe is a 34 year old female with a history of ileocolonic Crohn's disease diagnosed in March 2018, history of erythema nodosum, history of C. difficile colitis, ADHD, uterine fibroids and recent diagnosis of bipolar depression who is here for follow-up.  I last saw her in March 2021.  She is here alone today.  She has been maintained on Stelara and after her last visit we performed an MR enterography.  This showed fairly long segment ileal disease with probable fibrostenotic component.  I added azathioprine and Entocort.  She took azathioprine but had right upper quadrant pain and so she stopped this medication after only a few weeks.  She took budesonide for a short time but stopped because she reports "I am not very good with pills".  She is occasionally taking her PPI.  From a symptom standpoint she reports that her appetite is poor which she calls "awful".  She reports she does not have the desire to eat and she forces herself to drink Ensure.  She will drink 4-5 ensures per day.  She does eat some solid foods and this morning had a bacon egg and cheese sandwich.  She also enjoys cookies and ice cream.  Her weight is down about 14 pounds from when she was last seen.  She reports that her energy levels are also down.  She feels constipated at times and is not having blood in her stool, melena or diarrhea.  Her stools are formed and at times hard.  She will occasionally use MiraLAX which seems to help.  She reports frequent burning epigastric and mid abdominal pain with eating.  She frequently uses ondansetron which helps with nausea.  She is using ondansetron 2-3 times per day.  No vomiting.  She does have gas and bloating symptom.  She was started on Latuda in the summer for bipolar depression which she thinks is helping.  She has a 62-year-old son at home who is not yet walking but is  pulling up and cruising.   Review of Systems As per HPI, otherwise negative  Current Medications, Allergies, Past Medical History, Past Surgical History, Family History and Social History were reviewed in [employer] record.     Objective:   Physical Exam BP 92/62 (BP Location: Left Arm, Patient Position: Sitting, Cuff Size: Normal)   Pulse (!) 104   Ht 4' 10"  (1.473 m)   Wt 114 lb 4 oz (51.8 kg)   SpO2 97%   BMI 23.88 kg/m  Gen: awake, alert, NAD HEENT: anicteric CV: RRR, no mrg Pulm: CTA b/l Abd: soft, mild tenderness in the mid and right abdomen without rebound or guarding, nondistended, +BS throughout Ext: no c/c/e Neuro: nonfocal  CBC    Component Value Date/Time   WBC 7.4 01/16/2020 1437   RBC 4.63 01/16/2020 1437   HGB 13.4 01/16/2020 1437   HGB 9.8 (L) 09/02/2018 1651   HCT 40.0 01/16/2020 1437   HCT 28.5 (L) 09/02/2018 1651   PLT 488.0 (H) 01/16/2020 1437   PLT 492 (H) 09/02/2018 1651   MCV 86.3 01/16/2020 1437   MCV 84 09/02/2018 1651   MCH 30.4 01/07/2019 1417   MCHC 33.4 01/16/2020 1437   RDW 14.7 01/16/2020 1437   RDW 14.7 09/02/2018 1651   LYMPHSABS 3.4 01/16/2020 1437   LYMPHSABS 2.9 09/02/2018 1651   MONOABS 0.6 01/16/2020 1437  EOSABS 0.4 01/16/2020 1437   EOSABS 0.4 09/02/2018 1651   BASOSABS 0.1 01/16/2020 1437   BASOSABS 0.0 09/02/2018 1651   CMP     Component Value Date/Time   NA 137 01/16/2020 1437   K 4.0 01/16/2020 1437   CL 104 01/16/2020 1437   CO2 28 01/16/2020 1437   GLUCOSE 80 01/16/2020 1437   BUN 12 01/16/2020 1437   CREATININE 0.87 01/16/2020 1437   CALCIUM 9.1 01/16/2020 1437   PROT 7.5 01/16/2020 1437   ALBUMIN 3.9 01/16/2020 1437   AST 11 01/16/2020 1437   ALT 6 01/16/2020 1437   ALKPHOS 55 01/16/2020 1437   BILITOT 0.2 01/16/2020 1437   GFRNONAA >60 01/07/2019 1417   GFRAA >60 01/07/2019 1417   MR ABDOMEN AND PELVIS WITHOUT AND WITH CONTRAST (MR ENTEROGRAPHY)   TECHNIQUE: Multiplanar,  multisequence MRI of the abdomen and pelvis was performed both before and during bolus administration of intravenous contrast. Negative oral contrast VoLumen was given.   CONTRAST:  1m GADAVIST GADOBUTROL 1 MMOL/ML IV SOLN   COMPARISON:  CT abdomen/pelvis dated 06/26/2017   FINDINGS: Lower chest: Lung bases are clear.   Hepatobiliary: Liver is within normal limits.  No hepatic steatosis.   Status post cholecystectomy. No intrahepatic or extrahepatic ductal dilatation.   Pancreas:  Within normal limits.   Spleen:  Within normal limits.   Adrenals/Urinary Tract:  Adrenal glands are within normal limits.   Kidneys are within normal limits.  No hydronephrosis.   Bladder is within normal limits.   Stomach/Bowel: Stomach is within normal limits.   Wall thickening with mucosal hyperenhancement involving a long segment of the distal/terminal ileum (series 2/images 26, 28, and 32), suggesting active inflammatory Crohn's disease. Associated luminal narrowing, suggesting there may be some degree of fibrostenosing component.   However, there is no evidence of small-bowel obstruction. No visualized fistula or abscess.   Ascending and transverse colon are mildly prominent/dilated, favoring adynamic colonic ileus. No colonic wall thickening or inflammatory changes.   Vascular/Lymphatic:  No evidence of abdominal aortic aneurysm.   No suspicious abdominopelvic lymphadenopathy.   Reproductive: Uterus is notable for a pedunculated 2.2 x 3.2 cm fibroid along the right posterior uterine body (series 2/image 34).   Bilateral ovaries are within normal limits.   Other:  Trace pelvic ascites (series 2/image 36).   Musculoskeletal: No focal osseous lesions.   IMPRESSION: Active inflammatory Crohn's disease involving a long segment of the distal/terminal ileum. Possible associated fibrostenosing component.   No evidence of bowel obstruction, fistula, or abscess.   Mildly prominent  right colon, without wall thickening or inflammatory changes, favoring adynamic colonic ileus.   Additional ancillary findings as above.     Electronically Signed   By: SJulian HyM.D.   On: 07/21/2019 21:57       Assessment & Plan:   34year old female with a history of ileocolonic Crohn's disease diagnosed in March 2018, history of erythema nodosum, history of C. difficile colitis, ADHD, uterine fibroids and recent diagnosis of bipolar depression who is here for follow-up.  1.  Ileocolonic Crohn's disease --she has active disease by MR enterography and also symptoms of burning epigastric and mid abdominal pain, poor appetite, nausea, fatigue and weight loss.  She is on Stelara with adequate drug level and no antibodies.  We tried her on azathioprine but she stopped this with right upper quadrant pain but it should be noted she did not have documented pancreatitis.  She also took budesonide but not  for very long.  I recommended the following: --Upper endoscopy and colonoscopy to evaluate her Crohn's disease endoscopically; evaluate for colonic involvement, rule out upper GI Crohn's --Continue Stelara every 8 weeks --Anticipate surgical referral to Dr. Drue Flirt for consideration of ileal resection given disease despite Stelara.  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  We discussed probable surgical referral today --CBC, CMP, QuantiFERON gold, CRP  2.  Mild constipation --MiraLAX 17 g daily  3.  GERD --intermittent heartburn and dyspeptic symptoms.  She is using over-the-counter PPI occasionally.   40 minutes total spent today including patient facing time, coordination of care, reviewing medical history/procedures/pertinent radiology studies, and documentation of the encounter.

## 2020-01-16 NOTE — Patient Instructions (Signed)
You have been scheduled for an endoscopy and colonoscopy. Please follow the written instructions given to you at your visit today. Please pick up your prep supplies at the pharmacy within the next 1-3 days. If you use inhalers (even only as needed), please bring them with you on the day of your procedure.  Your provider has requested that you go to the basement level for lab work before leaving today. Press "B" on the elevator. The lab is located at the first door on the left as you exit the elevator.  Please purchase the following medications over the counter and take as directed: Miralax 17 grams (1 capful) dissolved in at least 8 ounces water/juice once daily  Continue Stelara.  If you are age 78 or older, your body mass index should be between 23-30. Your Body mass index is 23.88 kg/m. If this is out of the aforementioned range listed, please consider follow up with your Primary Care Provider.  If you are age 65 or younger, your body mass index should be between 19-25. Your Body mass index is 23.88 kg/m. If this is out of the aformentioned range listed, please consider follow up with your Primary Care Provider.   Due to recent changes in healthcare laws, you may see the results of your imaging and laboratory studies on MyChart before your provider has had a chance to review them.  We understand that in some cases there may be results that are confusing or concerning to you. Not all laboratory results come back in the same time frame and the provider may be waiting for multiple results in order to interpret others.  Please give [place] 48 hours in order for your provider to thoroughly review all the results before contacting the office for clarification of your results.

## 2020-01-19 LAB — QUANTIFERON-TB GOLD PLUS
Mitogen-NIL: >10 IU/mL
NIL: 0.03 IU/mL
QuantiFERON-TB Gold Plus: NEGATIVE
TB1-NIL: 0.01 IU/mL
TB2-NIL: 0.01 IU/mL

## 2020-02-20 ENCOUNTER — Other Ambulatory Visit: Payer: Self-pay | Admitting: Internal Medicine

## 2020-02-23 NOTE — Telephone Encounter (Signed)
She will be coming soon for EGD/colon Previously her Stelara levels are adequate without antibodies but if clinically symptoms are recurring prior to redosing we can try to change dose to every 7 weeks.  We can see if insurance will allow but I am okay if she goes ahead and redoses now.

## 2020-02-27 ENCOUNTER — Other Ambulatory Visit: Payer: Self-pay

## 2020-02-27 ENCOUNTER — Other Ambulatory Visit: Payer: Self-pay | Admitting: Internal Medicine

## 2020-02-27 DIAGNOSIS — Z1159 Encounter for screening for other viral diseases: Secondary | ICD-10-CM | POA: Diagnosis not present

## 2020-02-27 LAB — SARS CORONAVIRUS 2 (TAT 6-24 HRS): SARS Coronavirus 2: NEGATIVE

## 2020-02-27 MED ORDER — ONDANSETRON 4 MG PO TBDP: ORAL_TABLET | ORAL | 0 refills | Status: DC

## 2020-02-29 ENCOUNTER — Ambulatory Visit (AMBULATORY_SURGERY_CENTER): Payer: Medicaid Other | Admitting: Internal Medicine

## 2020-02-29 ENCOUNTER — Other Ambulatory Visit: Payer: Self-pay

## 2020-02-29 ENCOUNTER — Encounter: Payer: Self-pay | Admitting: Internal Medicine

## 2020-02-29 VITALS — BP 101/71 | HR 95 | Temp 98.4°F | Resp 19 | Ht <= 58 in | Wt 114.0 lb

## 2020-02-29 DIAGNOSIS — K5090 Crohn's disease, unspecified, without complications: Secondary | ICD-10-CM | POA: Diagnosis not present

## 2020-02-29 DIAGNOSIS — K2950 Unspecified chronic gastritis without bleeding: Secondary | ICD-10-CM | POA: Diagnosis not present

## 2020-02-29 DIAGNOSIS — R110 Nausea: Secondary | ICD-10-CM

## 2020-02-29 DIAGNOSIS — K2970 Gastritis, unspecified, without bleeding: Secondary | ICD-10-CM | POA: Diagnosis not present

## 2020-02-29 DIAGNOSIS — K50019 Crohn's disease of small intestine with unspecified complications: Secondary | ICD-10-CM

## 2020-02-29 DIAGNOSIS — K5900 Constipation, unspecified: Secondary | ICD-10-CM | POA: Diagnosis not present

## 2020-02-29 DIAGNOSIS — K529 Noninfective gastroenteritis and colitis, unspecified: Secondary | ICD-10-CM | POA: Diagnosis not present

## 2020-02-29 MED ORDER — SODIUM CHLORIDE 0.9 % IV SOLN: 500.0000 mL | Freq: Once | INTRAVENOUS | Status: DC

## 2020-02-29 NOTE — Progress Notes (Signed)
Report to PACU, RN, vss, BBS= Clear.  

## 2020-02-29 NOTE — Progress Notes (Signed)
Called to room to assist during endoscopic procedure.  Patient ID and intended procedure confirmed with present staff. Received instructions for my participation in the procedure from the performing physician.  

## 2020-02-29 NOTE — Op Note (Signed)
[facility] Patient Name: Whitney Lowe Procedure Date: 02/29/2020 2:05 PM MRN: [id number] Endoscopist: [name] , MD Age: 34 Referring MD:  Date of Birth: March 23, 1986 Gender: Female Account #: [id number] Procedure:                Colonoscopy Indications:              Disease activity assessment of Crohn's disease of                            the small bowel and colon, active symptoms despite                            Stelara; abnormal MR enterography Medicines:                Monitored Anesthesia Care Procedure:                Pre-Anesthesia Assessment:                           - Prior to the procedure, a History and Physical                            was performed, and patient medications and                            allergies were reviewed. The patient's tolerance of                            previous anesthesia was also reviewed. The risks                            and benefits of the procedure and the sedation                            options and risks were discussed with the patient.                            All questions were answered, and informed consent                            was obtained. Prior Anticoagulants: The patient has                            taken no previous anticoagulant or antiplatelet                            agents. ASA Grade Assessment: II - A patient with                            mild systemic disease. After reviewing the risks                            and benefits, the patient was deemed in  satisfactory condition to undergo the procedure.                           After obtaining informed consent, the colonoscope                            was passed under direct vision. Throughout the                            procedure, the patient's blood pressure, pulse, and                            oxygen saturations were monitored continuously. The                            Colonoscope was introduced  through the anus and                            advanced to the cecum, identified by appendiceal                            orifice and ileocecal valve. The colonoscopy was                            performed without difficulty. The patient tolerated                            the procedure well. The quality of the bowel                            preparation was good. The ileocecal valve,                            appendiceal orifice, and rectum were photographed. Scope In: 2:30:05 PM Scope Out: 2:45:11 PM Scope Withdrawal Time: 0 hours 11 minutes 20 seconds  Total Procedure Duration: 0 hours 15 minutes 6 seconds  Findings:                 The digital rectal exam was normal.                           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                           The Simple Endoscopic Score for Crohn's Disease was                            determined based on the endoscopic appearance of                            the mucosa in the following segments:                           -  Ileum: Findings include narrowing(s) that cannot                            be passed. Segment score: 3.                           - Right Colon: Findings include aphthous ulcers                            less than 0.5 cm in size, 10-30% ulcerated                            surfaces, greater than 75% of surfaces affected and                            no narrowings. Segment score: 6.                           - Transverse Colon: Findings include aphthous                            ulcers less than 0.5 cm in size, 10-30% ulcerated                            surfaces, 50-75% of surfaces affected and no                            narrowings. Segment score: 5.                           - Left Colon: Findings include aphthous ulcers less                            than 0.5 cm in size, 10-30% ulcerated surfaces,                            50-75% of surfaces  affected and no narrowings.                            Segment score: 5.                           - Rectum: Findings include aphthous ulcers less                            than 0.5 cm in size, no ulcerated surfaces, less                            than 50% of surfaces affected and no narrowings.                            Segment score: 2. Biopsies were taken with a cold  forceps for histology.                           The retroflexed view of the distal rectum and anal                            verge was normal and showed no anal or rectal                            abnormalities. Complications:            No immediate complications. Estimated Blood Loss:     Estimated blood loss was minimal. Impression:               - Stricture at the ileocecal valve. Ileum not able                            to be examined due to stenosis at ICV.                           - Simple Endoscopic Score for Crohn's Disease: 21;                            mucosal inflammatory changes consistent with                            Crohn's disease (as above moderated throughout the                            colon). Biopsied.                           - The distal rectum and anal verge are normal on                            retroflexion view. Recommendation:           - Patient has a contact number available for                            emergencies. The signs and symptoms of potential                            delayed complications were discussed with the                            patient. Return to normal activities tomorrow.                            Written discharge instructions were provided to the                            patient.                           - Resume previous diet.                           -  Continue present medications.                           - Await pathology results.                           - Decisions regarding medical therapy to be made                             once biopsies reviewed.                           - Repeat colonoscopy is recommended. The                            colonoscopy date will be determined after pathology                            results from today's exam become available for                            review.                           - Office followup with me in Jan 2022. [name], MD 02/29/2020 3:00:44 PM This report has been signed electronically.

## 2020-02-29 NOTE — Op Note (Signed)
[facility] Patient Name: Whitney Lowe Procedure Date: 02/29/2020 2:06 PM MRN: [id number] Endoscopist: [name] , MD Age: 34 Referring MD:  Date of Birth: 03-31-1986 Gender: Female Account #: [id number] Procedure:                Upper GI endoscopy Indications:              Crohn's disease of small and large bowel, nausea                            without vomiting, GERD and indigestion symptoms Medicines:                Monitored Anesthesia Care Procedure:                Pre-Anesthesia Assessment:                           - Prior to the procedure, a History and Physical                            was performed, and patient medications and                            allergies were reviewed. The patient's tolerance of                            previous anesthesia was also reviewed. The risks                            and benefits of the procedure and the sedation                            options and risks were discussed with the patient.                            All questions were answered, and informed consent                            was obtained. Prior Anticoagulants: The patient has                            taken no previous anticoagulant or antiplatelet                            agents. ASA Grade Assessment: II - A patient with                            mild systemic disease. After reviewing the risks                            and benefits, the patient was deemed in                            satisfactory condition to undergo the procedure.  After obtaining informed consent, the endoscope was                            passed under direct vision. Throughout the                            procedure, the patient's blood pressure, pulse, and                            oxygen saturations were monitored continuously. The                            Endoscope was introduced through the mouth, and                            advanced to  the second part of duodenum. The upper                            GI endoscopy was accomplished without difficulty.                            The patient tolerated the procedure well. Scope In: Scope Out: Findings:                 The examined esophagus was normal.                           Multiple dispersed diminutive erosions with no                            bleeding and no stigmata of recent bleeding were                            found at the incisura and in the gastric antrum.                            Biopsies were taken with a cold forceps for                            histology.                           The cardia and gastric fundus were normal on                            retroflexion.                           The examined duodenum was normal. Biopsies were                            taken with a cold forceps for histology. Complications:            No immediate complications. Estimated Blood Loss:     Estimated blood loss was minimal. Impression:               -  Normal esophagus.                           - Scattered erosive gastropathy with no bleeding                            and no stigmata of recent bleeding. Biopsied.                           - Normal examined duodenum. Biopsied. Recommendation:           - Patient has a contact number available for                            emergencies. The signs and symptoms of potential                            delayed complications were discussed with the                            patient. Return to normal activities tomorrow.                            Written discharge instructions were provided to the                            patient.                           - Resume previous diet.                           - Continue present medications.                           - Await pathology results.                           - See the other procedure note for documentation of                            additional  recommendations. [name], MD 02/29/2020 2:51:53 PM This report has been signed electronically.

## 2020-02-29 NOTE — Patient Instructions (Signed)
Thank you for allowing [place] to care for you today!  Dr Hilarie Fredrickson will call you when biopsy results are available to discuss treatment options.  Resume previous diet and medications today.  Return to your normal activities tomorrow.    YOU HAD AN ENDOSCOPIC PROCEDURE TODAY AT [facility]:   Refer to the procedure report that was given to you for any specific questions about what was found during the examination.  If the procedure report does not answer your questions, please call your gastroenterologist to clarify.  If you requested that your care partner not be given the details of your procedure findings, then the procedure report has been included in a sealed envelope for you to review at your convenience later.  YOU SHOULD EXPECT: Some feelings of bloating in the abdomen. Passage of more gas than usual.  Walking can help get rid of the air that was put into your GI tract during the procedure and reduce the bloating. If you had a lower endoscopy (such as a colonoscopy or flexible sigmoidoscopy) you may notice spotting of blood in your stool or on the toilet paper. If you underwent a bowel prep for your procedure, you may not have a normal bowel movement for a few days.  Please Note:  You might notice some irritation and congestion in your nose or some drainage.  This is from the oxygen used during your procedure.  There is no need for concern and it should clear up in a day or so.  SYMPTOMS TO REPORT IMMEDIATELY:   Following lower endoscopy (colonoscopy or flexible sigmoidoscopy):  Excessive amounts of blood in the stool  Significant tenderness or worsening of abdominal pains  Swelling of the abdomen that is new, acute  Fever of 100F or higher   Following upper endoscopy (EGD)  Vomiting of blood or coffee ground material  New chest pain or pain under the shoulder blades  Painful or persistently difficult swallowing  New shortness of breath  Fever of 100F or  higher  Black, tarry-looking stools  For urgent or emergent issues, a gastroenterologist can be reached at any hour by calling [phone]. Do not use MyChart messaging for urgent concerns.    DIET:  We do recommend a small meal at first, but then you may proceed to your regular diet.  Drink plenty of fluids but you should avoid alcoholic beverages for 24 hours.  ACTIVITY:  You should plan to take it easy for the rest of today and you should NOT DRIVE or use heavy machinery until tomorrow (because of the sedation medicines used during the test).    FOLLOW UP: Our staff will call the number listed on your records 48-72 hours following your procedure to check on you and address any questions or concerns that you may have regarding the information given to you following your procedure. If we do not reach you, we will leave a message.  We will attempt to reach you two times.  During this call, we will ask if you have developed any symptoms of COVID 19. If you develop any symptoms (ie: fever, flu-like symptoms, shortness of breath, cough etc.) before then, please call [phone].  If you test positive for Covid 19 in the 2 weeks post procedure, please call and report this information to [place].    If any biopsies were taken you will be contacted by phone or by letter within the next 1-3 weeks.  Please call us at [phone] if you have  not heard about the biopsies in 3 weeks.    SIGNATURES/CONFIDENTIALITY: You and/or your care partner have signed paperwork which will be entered into your electronic medical record.  These signatures attest to the fact that that the information above on your After Visit Summary has been reviewed and is understood.  Full responsibility of the confidentiality of this discharge information lies with you and/or your care-partner.

## 2020-03-02 ENCOUNTER — Telehealth: Payer: Self-pay

## 2020-03-02 NOTE — Telephone Encounter (Signed)
  Follow up Call-  Call back number 02/29/2020  Post procedure Call Back phone  # [phone]  Permission to leave phone message Yes  Some recent data might be hidden     Patient questions:  Do you have a fever, pain , or abdominal swelling? No. Pain Score  0 *  Have you tolerated food without any problems? Yes.    Have you been able to return to your normal activities? Yes.    Do you have any questions about your discharge instructions: Diet   No. Medications  No. Follow up visit  No.  Do you have questions or concerns about your Care? No.  Actions: * If pain score is 4 or above: No action needed, pain <4.  1. Have you developed a fever since your procedure? no  2.   Have you had an respiratory symptoms (SOB or cough) since your procedure? no  3.   Have you tested positive for COVID 19 since your procedure no  4.   Have you had any family members/close contacts diagnosed with the COVID 19 since your procedure?  no   If yes to any of these questions please route to Joylene John, RN and Joella Prince, RN

## 2020-03-02 NOTE — Telephone Encounter (Signed)
Left message on follow up call. 

## 2020-04-16 ENCOUNTER — Emergency Department (HOSPITAL_COMMUNITY)
Admission: EM | Admit: 2020-04-16 | Discharge: 2020-04-16 | Disposition: A | Discharge status: Home / Self Care | Payer: Medicaid Other | Attending: Emergency Medicine | Admitting: Emergency Medicine

## 2020-04-16 ENCOUNTER — Encounter (HOSPITAL_COMMUNITY): Payer: Self-pay

## 2020-04-16 ENCOUNTER — Other Ambulatory Visit: Payer: Self-pay

## 2020-04-16 DIAGNOSIS — R5383 Other fatigue: Secondary | ICD-10-CM | POA: Diagnosis not present

## 2020-04-16 DIAGNOSIS — Z5321 Procedure and treatment not carried out due to patient leaving prior to being seen by health care provider: Secondary | ICD-10-CM | POA: Insufficient documentation

## 2020-04-16 DIAGNOSIS — R059 Cough, unspecified: Secondary | ICD-10-CM | POA: Diagnosis not present

## 2020-04-16 DIAGNOSIS — R519 Headache, unspecified: Secondary | ICD-10-CM | POA: Insufficient documentation

## 2020-04-16 NOTE — ED Triage Notes (Signed)
Pt presents with c/o cough, fatigue, and headache. Pt reports the cough has been present for several months. Pt reports the fatigue started this morning and also reported that her legs just feel very "tired". Pt also reports headache that started 2 days ago.

## 2020-04-20 ENCOUNTER — Other Ambulatory Visit: Payer: Self-pay

## 2020-04-20 ENCOUNTER — Emergency Department (HOSPITAL_COMMUNITY)
Admission: EM | Admit: 2020-04-20 | Discharge: 2020-04-21 | Disposition: A | Discharge status: Home / Self Care | Payer: Medicaid Other | Attending: Emergency Medicine | Admitting: Emergency Medicine

## 2020-04-20 ENCOUNTER — Encounter (HOSPITAL_COMMUNITY): Payer: Self-pay | Admitting: Emergency Medicine

## 2020-04-20 DIAGNOSIS — K5090 Crohn's disease, unspecified, without complications: Secondary | ICD-10-CM | POA: Insufficient documentation

## 2020-04-20 DIAGNOSIS — R197 Diarrhea, unspecified: Secondary | ICD-10-CM | POA: Diagnosis not present

## 2020-04-20 DIAGNOSIS — R110 Nausea: Secondary | ICD-10-CM | POA: Diagnosis not present

## 2020-04-20 DIAGNOSIS — R002 Palpitations: Secondary | ICD-10-CM

## 2020-04-20 DIAGNOSIS — Z87891 Personal history of nicotine dependence: Secondary | ICD-10-CM | POA: Insufficient documentation

## 2020-04-20 DIAGNOSIS — R109 Unspecified abdominal pain: Secondary | ICD-10-CM | POA: Insufficient documentation

## 2020-04-20 DIAGNOSIS — K529 Noninfective gastroenteritis and colitis, unspecified: Secondary | ICD-10-CM | POA: Diagnosis not present

## 2020-04-20 DIAGNOSIS — E860 Dehydration: Secondary | ICD-10-CM | POA: Diagnosis present

## 2020-04-20 DIAGNOSIS — R42 Dizziness and giddiness: Secondary | ICD-10-CM | POA: Insufficient documentation

## 2020-04-20 LAB — COMPREHENSIVE METABOLIC PANEL
ALT: 12 U/L (ref 0–44)
AST: 18 U/L (ref 15–41)
Albumin: 3.3 g/dL — ABNORMAL LOW (ref 3.5–5.0)
Alkaline Phosphatase: 54 U/L (ref 38–126)
Anion gap: 12 (ref 5–15)
BUN: 6 mg/dL (ref 6–20)
CO2: 21 mmol/L — ABNORMAL LOW (ref 22–32)
Calcium: 8.7 mg/dL — ABNORMAL LOW (ref 8.9–10.3)
Chloride: 106 mmol/L (ref 98–111)
Creatinine, Ser: 0.72 mg/dL (ref 0.44–1.00)
GFR, Estimated: >60 mL/min (ref >60)
Glucose, Bld: 101 mg/dL — ABNORMAL HIGH (ref 70–99)
Potassium: 3.4 mmol/L — ABNORMAL LOW (ref 3.5–5.1)
Sodium: 139 mmol/L (ref 135–145)
Total Bilirubin: 0.3 mg/dL (ref 0.3–1.2)
Total Protein: 7.3 g/dL (ref 6.5–8.1)

## 2020-04-20 LAB — URINALYSIS, ROUTINE W REFLEX MICROSCOPIC
Bilirubin Urine: NEGATIVE
Glucose, UA: NEGATIVE mg/dL
Hgb urine dipstick: NEGATIVE
Ketones, ur: 80 mg/dL — AB
Leukocytes,Ua: NEGATIVE
Nitrite: NEGATIVE
Protein, ur: NEGATIVE mg/dL
Specific Gravity, Urine: 1.029 (ref 1.005–1.030)
pH: 5 (ref 5.0–8.0)

## 2020-04-20 LAB — CBC
HCT: 40.1 % (ref 36.0–46.0)
Hemoglobin: 12.8 g/dL (ref 12.0–15.0)
MCH: 27.8 pg (ref 26.0–34.0)
MCHC: 31.9 g/dL (ref 30.0–36.0)
MCV: 87 fL (ref 80.0–100.0)
Platelets: 386 10*3/uL (ref 150–400)
RBC: 4.61 MIL/uL (ref 3.87–5.11)
RDW: 14.6 % (ref 11.5–15.5)
WBC: 6.8 10*3/uL (ref 4.0–10.5)
nRBC: 0 % (ref 0.0–0.2)

## 2020-04-20 LAB — I-STAT BETA HCG BLOOD, ED (MC, WL, AP ONLY): I-stat hCG, quantitative: <5 m[IU]/mL (ref <5)

## 2020-04-20 LAB — LIPASE, BLOOD: Lipase: 27 U/L (ref 11–51)

## 2020-04-20 NOTE — ED Notes (Signed)
Refused vitals 

## 2020-04-20 NOTE — ED Triage Notes (Signed)
Pt reports chest palpitations and not sleeping for the last 4 or 5 days. Pt with hx of Chron's states she believes she's having a flare. Not eating. Having multiple episodes of diarrhea.

## 2020-04-21 ENCOUNTER — Telehealth: Payer: Self-pay

## 2020-04-21 ENCOUNTER — Emergency Department (HOSPITAL_COMMUNITY): Payer: Medicaid Other

## 2020-04-21 ENCOUNTER — Other Ambulatory Visit: Payer: Self-pay

## 2020-04-21 DIAGNOSIS — K529 Noninfective gastroenteritis and colitis, unspecified: Secondary | ICD-10-CM | POA: Diagnosis not present

## 2020-04-21 MED ORDER — SODIUM CHLORIDE 0.9 % IV BOLUS
1000.0000 mL | Freq: Once | INTRAVENOUS | Status: AC
  Administered 2020-04-21: 1000 mL via INTRAVENOUS

## 2020-04-21 MED ORDER — IOHEXOL 300 MG/ML  SOLN
100.0000 mL | Freq: Once | INTRAMUSCULAR | Status: AC | PRN
  Administered 2020-04-21: 100 mL via INTRAVENOUS

## 2020-04-21 MED ORDER — ONDANSETRON 4 MG PO TBDP: 4.0000 mg | ORAL_TABLET | Freq: Three times a day (TID) | ORAL | 0 refills | Status: DC | PRN

## 2020-04-21 MED ORDER — MORPHINE SULFATE (PF) 4 MG/ML IV SOLN
4.0000 mg | Freq: Once | INTRAVENOUS | Status: AC
  Administered 2020-04-21: 4 mg via INTRAVENOUS
  Filled 2020-04-21: qty 1

## 2020-04-21 MED ORDER — ONDANSETRON HCL 4 MG/2ML IJ SOLN
4.0000 mg | Freq: Once | INTRAMUSCULAR | Status: AC
  Administered 2020-04-21: 4 mg via INTRAVENOUS
  Filled 2020-04-21: qty 2

## 2020-04-21 MED ORDER — PREDNISONE 10 MG PO TABS: ORAL_TABLET | ORAL | 0 refills | Status: DC

## 2020-04-21 NOTE — ED Provider Notes (Signed)
[facility] EMERGENCY DEPARTMENT Provider Note   CSN: [id number] Arrival date & time: 04/20/20  1924     History Chief Complaint  Patient presents with  . Dehydration    Whitney Lowe is a 35 y.o. female.  HPI   35 year old female history of ADHD, anemia, anxiety, bipolar, Crohn's, eczema, fibroids, UTI, ovarian cyst, who presents to the emergency department today for evaluation of dehydration.  Patient states she believes she is having a Crohn's flare.  For the last 4 to 5 days she has had significant amounts of diarrhea.  She denies any bloody diarrhea.  She reports some abdominal pain as well.  She is had some nausea since being in the waiting room for the last 10 hours that she has not eaten anything.  She is not had any vomiting.  She is not had any fevers.  She also was complaining of some lightheadedness due to dehydration was having some palpitations.  She denies any chest pain or shortness of breath.  She states that she does not have any vertiginous symptoms.  She states she just came here because she needs to get IV fluids.  Past Medical History:  Diagnosis Date  . ADHD    stopped abilify with preg  . ADHD (attention deficit hyperactivity disorder)   . Anemia   . Anxiety    not currently on meds  . Bipolar   . Cataract    Rt eye  . Complication of anesthesia    became combative   . Crohn's ileocolitis ([place]) 06/19/2016  . Eczema   . Fibroids    uterine  . Infection    UTI  . Ovarian cyst     Patient Active Problem List   Diagnosis Date Noted  . Placental abruption 01/01/2019  . Tachycardia 12/01/2018  . Preterm premature rupture of membranes (PPROM), second trimester, antepartum 11/25/2018  . Antepartum placental abruption 10/05/2018  . Cervical mass 10/04/2018  . Fibroid uterus 10/04/2018  . Anemia affecting pregnancy, antepartum 09/06/2018  . Rubella non-immune status, antepartum 09/03/2018  . Rh negative, antepartum 09/03/2018  .  Supervision of high risk pregnancy, antepartum 08/17/2018  . Long-term use of immunosuppressant medication-Stelara 07/21/2018  . Crohn's ileocolitis ([place]) 06/19/2016    Past Surgical History:  Procedure Laterality Date  . CESAREAN SECTION N/A 01/01/2019   Procedure: CESAREAN SECTION;  Surgeon: Sloan Leiter, MD;  Location: [facility] LD ORS;  Service: Obstetrics;  Laterality: N/A;  . COLONOSCOPY    . ESOPHAGOGASTRODUODENOSCOPY    . removal of cyst in ear     6th grade     OB History    Gravida  3   Para  1   Term      Preterm  1   AB  2   Living  1     SAB  1   IAB  1   Ectopic      Multiple  0   Live Births  1           Family History  Problem Relation Age of Onset  . Crohn's disease Mother        mom was adopted  . Heart disease Mother   . Anxiety disorder Mother   . Blindness Mother        Rt eye  . Diabetes Mellitus II Father   . COPD Father   . Hypertension Father   . Asthma Father   . ADD / ADHD Father   .  Diabetes Father        type 2  . Anxiety disorder Father   . Depression Father   . Diabetes Sister        Type 2  . Colon cancer Neg Hx   . Esophageal cancer Neg Hx   . Pancreatic cancer Neg Hx   . Stomach cancer Neg Hx   . Liver disease Neg Hx   . Rectal cancer Neg Hx     Social History   Tobacco Use  . Smoking status: Former Smoker    Types: Cigars, Cigarettes    Quit date: 04/21/2018    Years since quitting: 2.0  . Smokeless tobacco: Never Used  . Tobacco comment: social smoker  Vaping Use  . Vaping Use: Never used  Substance Use Topics  . Alcohol use: Yes    Comment: rare  . Drug use: Not Currently    Types: Marijuana    Comment: last use february 2020-     Home Medications Prior to Admission medications   Medication Sig Start Date End Date Taking? Authorizing Provider  lurasidone (LATUDA) 40 MG TABS tablet Take 40 mg by mouth daily with breakfast.    [provider]  ondansetron (ZOFRAN-ODT) 4 MG disintegrating  tablet Take 1 every 6-8 hours as needed 02/27/20   Pyrtle, Lajuan Lines, MD  Prenatal MV-Min-FA-Omega-3 (PRENATAL GUMMIES/DHA & FA PO) Take 2 each by mouth daily.    [provider]  STELARA 90 MG/ML SOSY injection ADMINISTER 1 ML(90 MG) UNDER THE SKIN EVERY 8 WEEKS 02/21/20   Pyrtle, Lajuan Lines, MD    Allergies    Benadryl [diphenhydramine]  Review of Systems   Review of Systems  Constitutional: Negative for chills and fever.  HENT: Negative for ear pain and sore throat.   Eyes: Negative for visual disturbance.  Respiratory: Negative for cough and shortness of breath.   Cardiovascular: Negative for chest pain.  Gastrointestinal: Positive for abdominal pain, diarrhea and nausea. Negative for constipation and vomiting.  Genitourinary: Negative for dysuria and hematuria.  Musculoskeletal: Negative for back pain.  Skin: Negative for rash.  Neurological: Positive for light-headedness. Negative for dizziness.  All other systems reviewed and are negative.   Physical Exam Updated Vital Signs BP 97/63   Pulse 87   Temp 98.2 F (36.8 C) (Oral)   Resp 18   LMP 03/29/2020 (Approximate)   SpO2 100%   Physical Exam Vitals and nursing note reviewed.  Constitutional:      General: She is not in acute distress.    Appearance: She is well-developed and well-nourished.  HENT:     Head: Normocephalic and atraumatic.     Mouth/Throat:     Mouth: Mucous membranes are dry.  Eyes:     Conjunctiva/sclera: Conjunctivae normal.  Cardiovascular:     Rate and Rhythm: Normal rate and regular rhythm.     Heart sounds: Normal heart sounds. No murmur heard.   Pulmonary:     Effort: Pulmonary effort is normal. No respiratory distress.     Breath sounds: Normal breath sounds. No wheezing, rhonchi or rales.  Abdominal:     General: Bowel sounds are normal.     Palpations: Abdomen is soft.     Tenderness: There is abdominal tenderness (mild epigastric ttp). There is no guarding or rebound.   Musculoskeletal:        General: No edema.     Cervical back: Neck supple.  Skin:    General: Skin is warm and dry.  Neurological:     Mental Status: She is alert.  Psychiatric:        Mood and Affect: Mood and affect normal.     ED Results / Procedures / Treatments   Labs (all labs ordered are listed, but only abnormal results are displayed) Labs Reviewed  COMPREHENSIVE METABOLIC PANEL - Abnormal; Notable for the following components:      Result Value   Potassium 3.4 (*)    CO2 21 (*)    Glucose, Bld 101 (*)    Calcium 8.7 (*)    Albumin 3.3 (*)    All other components within normal limits  URINALYSIS, ROUTINE W REFLEX MICROSCOPIC - Abnormal; Notable for the following components:   Ketones, ur 80 (*)    All other components within normal limits  LIPASE, BLOOD  CBC  I-STAT BETA HCG BLOOD, ED ([facility], [facility], [facility] ONLY)    EKG None  Radiology No results found.  Procedures Procedures (including critical care time)  Medications Ordered in ED Medications  sodium chloride 0.9 % bolus 1,000 mL (1,000 mLs Intravenous New Bag/Given 04/21/20 0613)  ondansetron (ZOFRAN) injection 4 mg (4 mg Intravenous Given 04/21/20 0609)  morphine 4 MG/ML injection 4 mg (4 mg Intravenous Given 04/21/20 0315)    ED Course  I have reviewed the triage vital signs and the nursing notes.  Pertinent labs & imaging results that were available during my care of the patient were reviewed by me and considered in my medical decision making (see chart for details).    MDM Rules/Calculators/A&P                          35 year old female presenting the emergency department today for evaluation of nausea, diarrhea and abdominal pain with history of Crohn's.  Feel she is having a Crohn's flare.  Reviewed/interpreted labs CBC is without leukocytosis or anemia CMP with mild hypokalemia, slightly low bicarb, normal creatinine and LFTs Lipase is negative Beta-hCG is negative UA shows ketonuria but no evidence  of UTI  Patient was given IV fluids, antiemetics and pain medication in the emergency department.  On reassessment she is tolerating p.o.  At shift change she is still receiving IV fluids.  Signed out to [employer], PA-C pending reassessment after IV fluids.  If patient still having significant abdominal pain would consider CT abdomen/pelvis.  If not, feel she is appropriate for DC with GI follow-up  Final Clinical Impression(s) / ED Diagnoses Final diagnoses:  Abdominal pain, unspecified abdominal location  Diarrhea, unspecified type    Rx / DC Orders ED Discharge Orders    None       Rodney Booze, PA-C 04/21/20 [place], Delice Bison, DO 04/21/20 [phone]

## 2020-04-21 NOTE — Telephone Encounter (Signed)
Patient husband called in that pharmacy that medication was called into is closed today. Recalled prescriptions over to [address]. Will cancel scripts at [facility] in the AM.

## 2020-04-21 NOTE — Discharge Instructions (Addendum)
You are seen in the emergency department today for your abdominal pain and diarrhea. CT scan revealed that you have an active Crohn's flare. I spoke with Dr. Lyndel Safe, from [facility] gastroenterology, who recommends prednisone taper for the next 4 weeks. Additionally he would like for you to follow-up in their office within the next 1 to 2 weeks for reevaluation. The prednisone has been prescribed to your pharmacy of choice. Additionally, you were given a prescription for zofran to help with your nausea. Please take as directed.  Please follow up with your primary doctor within the next 5-7 days.  If you do not have a primary care provider, information for a healthcare clinic has been provided for you to make arrangements for follow up care. Please return to the ER sooner if you have any new or worsening symptoms, or if you have any of the following symptoms:  Abdominal pain that does not go away.  You have a fever.  You keep throwing up (vomiting).  The pain is felt only in portions of the abdomen. Pain in the right side could possibly be appendicitis. In an adult, pain in the left lower portion of the abdomen could be colitis or diverticulitis.  You pass bloody or black tarry stools.  There is bright red blood in the stool.  The constipation stays for more than 4 days.  There is belly (abdominal) or rectal pain.  You do not seem to be getting better.  You have any questions or concerns.

## 2020-04-21 NOTE — ED Provider Notes (Signed)
Physical Exam  BP 96/64 (BP Location: Right Arm)   Pulse 69   Temp 98.2 F (36.8 C) (Oral)   Resp 16   LMP 03/29/2020 (Approximate)   SpO2 98%   Physical Exam Vitals and nursing note reviewed.  Constitutional:      General: She is not in acute distress.    Appearance: She is normal weight.  HENT:     Head: Normocephalic and atraumatic.     Nose: Nose normal.     Mouth/Throat:     Mouth: Mucous membranes are moist.     Pharynx: No oropharyngeal exudate or posterior oropharyngeal erythema.  Eyes:     General:        Right eye: No discharge.        Left eye: No discharge.     Conjunctiva/sclera: Conjunctivae normal.     Pupils: Pupils are equal, round, and reactive to light.  Neck:     Trachea: Trachea and phonation normal.  Cardiovascular:     Rate and Rhythm: Normal rate and regular rhythm.     Pulses: Normal pulses.          Radial pulses are 2+ on the right side and 2+ on the left side.     Heart sounds: Normal heart sounds. No murmur heard.   Pulmonary:     Effort: Pulmonary effort is normal. No respiratory distress.     Breath sounds: Normal breath sounds. No wheezing or rales.  Chest:     Chest wall: No deformity, swelling, tenderness, crepitus or edema.  Abdominal:     General: Bowel sounds are normal. There is no distension.     Palpations: Abdomen is soft.     Tenderness: There is abdominal tenderness in the epigastric area and periumbilical area. There is no guarding or rebound.  Musculoskeletal:        General: No deformity.     Cervical back: Neck supple. No rigidity or crepitus. No pain with movement or spinous process tenderness.     Right lower leg: No edema.     Left lower leg: No edema.  Lymphadenopathy:     Cervical: No cervical adenopathy.  Skin:    General: Skin is warm and dry.     Capillary Refill: Capillary refill takes less than 2 seconds.  Neurological:     General: No focal deficit present.     Mental Status: She is alert and oriented  to person, place, and time.  Psychiatric:        Mood and Affect: Mood normal.     ED Course/Procedures     Procedures  MDM   35 year old female with history of Crohn's disease on Stelara, following closely with the [facility], who presents today with 5 days of significant months of nonbloody diarrhea.  She endorses some mild generalized abdominal pain as well.  Presented to the emergency department today with nausea, no emesis.  Afebrile.  I assumed care of this patient from preceding ED provider.  Please see her note for more extensive HPI and intake physical exam.  Labs obtained prior to my arrival.  CBC without leukocytosis, CMP with mild hypokalemia 3.4.  UA with ketonuria, negative for infection at this time.  Patient was administered IV fluids, pain medication, antiemetics.  CT scan not initially obtained due to very mild epigastric tenderness to palpation on exam.  Patient is completing IV fluid resuscitation at time of my assumption of her care.   Consult was placed to GI;  I personally spoke with gastroenterologist, Dr. Lyndel Safe who recommends proceeding with CT scan at this time.  He is requesting that I contact him again with CT scan results, to discuss disposition and treatment plan further.  I appreciate his collaboration in the care of this patient.  CT scan abdomen pelvis revealed active Crohn's ileitis involving a long segment of the distal and terminal ileum with skip lesions, similar to slightly worsened in wall thickness compared to 2019 CT.  No bowel obstruction or abscess. I discussed CT findings with Dr. Lyndel Safe, gastroenterologist; his recommendation is to proceed with prednisone taper x4 weeks.  He recommends patient follow-up in the office in 1 to 2 weeks for reevaluation.  Appreciate his collaboration of care of this patient.  Ms. Gwinner voiced understanding of her medical evaluation and treatment plan.  Each of her questions were answered to her expressed satisfaction.   Return precautions were given.  Patient is well-appearing, stable, and appropriate for discharge at this time.   This chart was dictated using voice recognition software, Dragon. Despite the best efforts of this provider to proofread and correct errors, errors may still occur which can change documentation meaning.     Aura Dials 04/21/20 1102    Quintella Reichert, MD 04/21/20 [phone]

## 2020-05-04 ENCOUNTER — Encounter: Payer: Self-pay | Admitting: *Deleted

## 2020-05-11 ENCOUNTER — Ambulatory Visit (INDEPENDENT_AMBULATORY_CARE_PROVIDER_SITE_OTHER): Payer: Medicaid Other | Admitting: Internal Medicine

## 2020-05-11 ENCOUNTER — Encounter: Payer: Self-pay | Admitting: Internal Medicine

## 2020-05-11 VITALS — BP 124/70 | HR 66 | Ht <= 58 in | Wt 109.0 lb

## 2020-05-11 DIAGNOSIS — K50819 Crohn's disease of both small and large intestine with unspecified complications: Secondary | ICD-10-CM

## 2020-05-11 MED ORDER — PREDNISONE 10 MG PO TABS: ORAL_TABLET | ORAL | 0 refills | Status: DC

## 2020-05-11 NOTE — Progress Notes (Signed)
Subjective:    Patient ID: Whitney Lowe, female    DOB: 08-08-1985, 35 y.o.   MRN: [id number]  HPI Whitney Lowe is a 35 year old female with a history of ileocolonic Crohn's disease diagnosed in March 2018, history of erythema nodosum, prior C. difficile infection, ADHD, uterine fibroids, bipolar depression who is here for follow-up. She is here alone today and was last seen at the time of her colonoscopy and upper endoscopy on 02/29/2020.  EGD 02/29/2020 --normal esophagus. Multiple diminutive erosions in the incisura and gastric antrum. Normal duodenum. Biopsies from the duodenum were unremarkable. Gastric biopsy showed slight chronic inflammation without H. pylori.  Colonoscopy 02/29/2020 -- ileocecal valve was stenosed and the terminal ileum not able to be examined. There were aphthous ulcerations scattered throughout the colon consistent with active Crohn's disease. Her simple endoscopic score for Crohn's was 21. Distal rectum and anal canal were normal. Biopsies showed minimal active inflammation without granulomas.  Unfortunately she was in the emergency room on New Year's Eve with abdominal pain and diarrhea. They performed a CT scan which showed active Crohn's disease predominantly in the small intestine with a long segment of terminal ileum but also a more proximal area of small bowel with a skip area which appeared normal. She was given a prednisone taper 40 mg x 1 week, 30 mg x 1 week, 20 mg x 1 week and 10 mg x 1 week. She currently is on 20 mg daily but really taking this about every other day.  She reports that prednisone has made her feel tremendously better. Her diarrhea has resolved. She is now having 1 mostly formed stool per day. Her abdominal pain is significantly better and now uncommon. Her nausea and vomiting have resolved. Her appetite is good. She has been gaining some weight. She took a dose of her Stelara on 04/17/2020.  She previously took Humira but developed  antibodies. She has also tried on immunomodulator but did not tolerate this.  Review of Systems As per HPI, otherwise negative  Current Medications, Allergies, Past Medical History, Past Surgical History, Family History and Social History were reviewed in [employer] record.     Objective:   Physical Exam BP 124/70 (BP Location: Left Arm, Patient Position: Sitting)    Pulse 66    Ht 4' 10"  (1.473 m)    Wt 109 lb (49.4 kg)    SpO2 98%    BMI 22.78 kg/m  Gen: awake, alert, NAD HEENT: anicteric  CV: RRR, no mrg Pulm: CTA b/l Abd: soft, NT/ND, +BS throughout Ext: no c/c/e Neuro: nonfocal  CBC    Component Value Date/Time   WBC 6.8 04/20/2020 1958   RBC 4.61 04/20/2020 1958   HGB 12.8 04/20/2020 1958   HGB 9.8 (L) 09/02/2018 1651   HCT 40.1 04/20/2020 1958   HCT 28.5 (L) 09/02/2018 1651   PLT 386 04/20/2020 1958   PLT 492 (H) 09/02/2018 1651   MCV 87.0 04/20/2020 1958   MCV 84 09/02/2018 1651   MCH 27.8 04/20/2020 1958   MCHC 31.9 04/20/2020 1958   RDW 14.6 04/20/2020 1958   RDW 14.7 09/02/2018 1651   LYMPHSABS 3.4 01/16/2020 1437   LYMPHSABS 2.9 09/02/2018 1651   MONOABS 0.6 01/16/2020 1437   EOSABS 0.4 01/16/2020 1437   EOSABS 0.4 09/02/2018 1651   BASOSABS 0.1 01/16/2020 1437   BASOSABS 0.0 09/02/2018 1651   CMP     Component Value Date/Time   NA 139 04/20/2020 1958   K  3.4 (L) 04/20/2020 1958   CL 106 04/20/2020 1958   CO2 21 (L) 04/20/2020 1958   GLUCOSE 101 (H) 04/20/2020 1958   BUN 6 04/20/2020 1958   CREATININE 0.72 04/20/2020 1958   CALCIUM 8.7 (L) 04/20/2020 1958   PROT 7.3 04/20/2020 1958   ALBUMIN 3.3 (L) 04/20/2020 1958   AST 18 04/20/2020 1958   ALT 12 04/20/2020 1958   ALKPHOS 54 04/20/2020 1958   BILITOT 0.3 04/20/2020 1958   GFRNONAA >60 04/20/2020 1958   GFRAA >60 01/07/2019 1417   CT ABDOMEN AND PELVIS WITH CONTRAST   TECHNIQUE: Multidetector CT imaging of the abdomen and pelvis was performed using the standard  protocol following bolus administration of intravenous contrast.   CONTRAST:  14m OMNIPAQUE IOHEXOL 300 MG/ML  SOLN   COMPARISON:  06/26/2017 CT abdomen/pelvis. 07/21/2019 MR enterography.   FINDINGS: Lower chest: No significant pulmonary nodules or acute consolidative airspace disease.   Hepatobiliary: Normal liver size. No liver mass. Normal gallbladder with no radiopaque cholelithiasis. No biliary ductal dilatation. Mild diffuse periportal edema.   Pancreas: Normal, with no mass or duct dilation.   Spleen: Normal size. No mass.   Adrenals/Urinary Tract: Normal adrenals. Normal kidneys with no hydronephrosis and no renal mass. Normal bladder.   Stomach/Bowel: Normal non-distended stomach. Long segment (approximately 30 cm) distal and terminal ileum with wall thickening and mucosal hyperenhancement with skip lesions, compatible with active Crohn ileitis, similar to slightly worsened in wall thickness compared to prior CT, similar in distribution. No significant small bowel dilatation. Normal appendix. Normal large bowel with no diverticulosis, large bowel wall thickening or pericolonic fat stranding.   Vascular/Lymphatic: Normal caliber abdominal aorta. Patent portal, splenic, hepatic and renal veins. No pathologically enlarged lymph nodes in the abdomen or pelvis.   Reproductive: Mildly enlarged myomatous uterus with dominant calcified 3.0 cm posterior uterine body fibroid and 2.2 cm anterior right uterine exophytic fibroid. Simple 1.9 cm left adnexal cyst (series 3/image 54). No right adnexal mass.   Other: No pneumoperitoneum, ascites or focal fluid collection. Small fat containing periumbilical hernia.   Musculoskeletal: No aggressive appearing focal osseous lesions. Suggestion of mild bilateral sacroiliitis, slightly worsened since 2019 CT.   IMPRESSION: 1. Active Crohn ileitis involving a long segment of distal and terminal ileum with skip lesions, similar  to slightly worsened in wall thickness compared to 2019 CT, similar in distribution. No bowel obstruction. No abscess. 2. Mild diffuse periportal edema, nonspecific, probably due to IV fluid resuscitation. 3. Mildly enlarged myomatous uterus. 4. Simple 1.9 cm left adnexal cyst. No follow-up imaging recommended. Note: This recommendation does not apply to premenarchal patients and to those with increased risk (genetic, family history, elevated tumor markers or other high-risk factors) of ovarian cancer. Reference: JACR 2020 Feb; 17(2):248-254 5. Small fat containing periumbilical hernia. 6. Suggestion of mild bilateral sacroiliitis, slightly worsened since 2019 CT.     Electronically Signed   By: JIlona SorrelM.D.   On: 04/21/2020 08:37      Assessment & Plan:  35year old female with a history of ileocolonic Crohn's disease diagnosed in March 2018, history of erythema nodosum, prior C. difficile infection, ADHD, uterine fibroids, bipolar depression who is here for follow-up.  1. Active ileocolonic Crohn's disease --unfortunately Stelara, with adequate drug levels and without antibodies is not controlling her Crohn's disease. We discussed this today. She has responded favorably to steroids but she understands that chronic steroids are a very poor option due to long-term side effects and risk of  complications. I recommended that we change therapy. She has been previously intolerant to immunomodulator's and developed antibodies to Humira. We discussed the risks, benefits and alternatives of Remicade and Entyvio. After this discussion she prefers Entyvio which I think is very reasonable. I will slightly lengthen her steroid taper to allow time hopefully for Entyvio to get started and to become effective. I also think if this fails to improve that she needs to meet with Dr. Drue Flirt to consider surgical resection of the active small bowel disease. -- Discontinue Stelara -- Begin Entyvio with  standard induction and dosing; begin ASAP -- Lengthen prednisone taper as follows: 20 mg x 2 weeks, 10 mg x 2 weeks and 5 mg x 2 weeks  30 minutes total spent today including patient facing time, coordination of care, reviewing medical history/procedures/pertinent radiology studies, and documentation of the encounter.

## 2020-05-11 NOTE — Patient Instructions (Signed)
Dr Hilarie Fredrickson has advised that you be on a prednisone taper. The taper instructions are as follows: Prednisone 20 mg daily x 14 days Prednisone 10 mg daily x 14 days Prednisone 5 mg daily x 14 days Then, discontinue.  We will change you over from Stelara to [facility].  Please follow up with Dr Hilarie Fredrickson in 3 months.  If you are age 36 or older, your body mass index should be between 23-30. Your Body mass index is 22.78 kg/m. If this is out of the aforementioned range listed, please consider follow up with your Primary Care Provider.  If you are age 88 or younger, your body mass index should be between 19-25. Your Body mass index is 22.78 kg/m. If this is out of the aformentioned range listed, please consider follow up with your Primary Care Provider.   Due to recent changes in healthcare laws, you may see the results of your imaging and laboratory studies on MyChart before your provider has had a chance to review them.  We understand that in some cases there may be results that are confusing or concerning to you. Not all laboratory results come back in the same time frame and the provider may be waiting for multiple results in order to interpret others.  Please give [place] 48 hours in order for your provider to thoroughly review all the results before contacting the office for clarification of your results.

## 2020-05-14 ENCOUNTER — Other Ambulatory Visit: Payer: Self-pay

## 2020-05-14 DIAGNOSIS — K50819 Crohn's disease of both small and large intestine with unspecified complications: Secondary | ICD-10-CM

## 2020-05-14 NOTE — Progress Notes (Signed)
Orders in for labs, pt aware and knows to come and have these drawn.

## 2020-05-17 ENCOUNTER — Telehealth: Payer: Self-pay

## 2020-05-17 NOTE — Telephone Encounter (Signed)
Called and left message for pt that she needs to come in for additional lab work in order to get the entyvio started. Pt also sent message via mychart. Letter also mailed to pt.

## 2020-05-18 ENCOUNTER — Other Ambulatory Visit: Payer: Medicaid Other

## 2020-05-18 DIAGNOSIS — K50819 Crohn's disease of both small and large intestine with unspecified complications: Secondary | ICD-10-CM | POA: Diagnosis not present

## 2020-05-21 LAB — HEPATITIS B SURFACE ANTIGEN: Hepatitis B Surface Ag: NONREACTIVE

## 2020-05-21 LAB — HEPATITIS C ANTIBODY
Hepatitis C Ab: NONREACTIVE
SIGNAL TO CUT-OFF: 0.02 (ref <1.00)

## 2020-05-21 LAB — HEPATITIS B CORE ANTIBODY, TOTAL: Hep B Core Total Ab: NONREACTIVE

## 2020-05-21 LAB — HEPATITIS B SURFACE ANTIBODY,QUALITATIVE: Hep B S Ab: REACTIVE — AB

## 2020-05-21 LAB — HEPATITIS A ANTIBODY, TOTAL: Hepatitis A AB,Total: NONREACTIVE

## 2020-05-29 DIAGNOSIS — K5080 Crohn's disease of both small and large intestine without complications: Secondary | ICD-10-CM | POA: Diagnosis not present

## 2020-06-01 DIAGNOSIS — F310 Bipolar disorder, current episode hypomanic: Secondary | ICD-10-CM | POA: Diagnosis not present

## 2020-06-07 DIAGNOSIS — H5213 Myopia, bilateral: Secondary | ICD-10-CM | POA: Diagnosis not present

## 2020-06-12 DIAGNOSIS — K5080 Crohn's disease of both small and large intestine without complications: Secondary | ICD-10-CM | POA: Diagnosis not present

## 2020-06-14 MED ORDER — ONDANSETRON 4 MG PO TBDP: 4.0000 mg | ORAL_TABLET | Freq: Three times a day (TID) | ORAL | 0 refills | Status: DC | PRN

## 2020-07-10 DIAGNOSIS — K5080 Crohn's disease of both small and large intestine without complications: Secondary | ICD-10-CM | POA: Diagnosis not present

## 2020-07-12 ENCOUNTER — Other Ambulatory Visit: Payer: Self-pay

## 2020-07-12 ENCOUNTER — Ambulatory Visit (HOSPITAL_COMMUNITY)
Admission: EM | Admit: 2020-07-12 | Discharge: 2020-07-12 | Disposition: A | Discharge status: Home / Self Care | Payer: Medicaid Other | Attending: Internal Medicine | Admitting: Internal Medicine

## 2020-07-12 ENCOUNTER — Ambulatory Visit (INDEPENDENT_AMBULATORY_CARE_PROVIDER_SITE_OTHER): Payer: Medicaid Other

## 2020-07-12 ENCOUNTER — Encounter (HOSPITAL_COMMUNITY): Payer: Self-pay

## 2020-07-12 DIAGNOSIS — R059 Cough, unspecified: Secondary | ICD-10-CM

## 2020-07-12 DIAGNOSIS — Z888 Allergy status to other drugs, medicaments and biological substances status: Secondary | ICD-10-CM | POA: Diagnosis not present

## 2020-07-12 DIAGNOSIS — Z20822 Contact with and (suspected) exposure to covid-19: Secondary | ICD-10-CM | POA: Insufficient documentation

## 2020-07-12 DIAGNOSIS — R051 Acute cough: Secondary | ICD-10-CM | POA: Insufficient documentation

## 2020-07-12 DIAGNOSIS — Z79899 Other long term (current) drug therapy: Secondary | ICD-10-CM | POA: Insufficient documentation

## 2020-07-12 DIAGNOSIS — K5090 Crohn's disease, unspecified, without complications: Secondary | ICD-10-CM | POA: Insufficient documentation

## 2020-07-12 DIAGNOSIS — J441 Chronic obstructive pulmonary disease with (acute) exacerbation: Secondary | ICD-10-CM | POA: Insufficient documentation

## 2020-07-12 DIAGNOSIS — Z87891 Personal history of nicotine dependence: Secondary | ICD-10-CM | POA: Insufficient documentation

## 2020-07-12 DIAGNOSIS — R21 Rash and other nonspecific skin eruption: Secondary | ICD-10-CM | POA: Diagnosis not present

## 2020-07-12 LAB — CBC WITH DIFFERENTIAL/PLATELET
Abs Immature Granulocytes: 0.01 10*3/uL (ref 0.00–0.07)
Basophils Absolute: 0.1 10*3/uL (ref 0.0–0.1)
Basophils Relative: 1 %
Eosinophils Absolute: 1.1 10*3/uL — ABNORMAL HIGH (ref 0.0–0.5)
Eosinophils Relative: 11 %
HCT: 41.3 % (ref 36.0–46.0)
Hemoglobin: 13.9 g/dL (ref 12.0–15.0)
Immature Granulocytes: 0 %
Lymphocytes Relative: 41 %
Lymphs Abs: 4 10*3/uL (ref 0.7–4.0)
MCH: 29.3 pg (ref 26.0–34.0)
MCHC: 33.7 g/dL (ref 30.0–36.0)
MCV: 87.1 fL (ref 80.0–100.0)
Monocytes Absolute: 0.8 10*3/uL (ref 0.1–1.0)
Monocytes Relative: 8 %
Neutro Abs: 3.7 10*3/uL (ref 1.7–7.7)
Neutrophils Relative %: 39 %
Platelets: 447 10*3/uL — ABNORMAL HIGH (ref 150–400)
RBC: 4.74 MIL/uL (ref 3.87–5.11)
RDW: 14.9 % (ref 11.5–15.5)
WBC: 9.7 10*3/uL (ref 4.0–10.5)
nRBC: 0 % (ref 0.0–0.2)

## 2020-07-12 MED ORDER — PREDNISONE 20 MG PO TABS: 20.0000 mg | ORAL_TABLET | Freq: Every day | ORAL | 0 refills | Status: AC

## 2020-07-12 MED ORDER — DOXYCYCLINE HYCLATE 100 MG PO CAPS: 100.0000 mg | ORAL_CAPSULE | Freq: Two times a day (BID) | ORAL | 0 refills | Status: AC

## 2020-07-12 MED ORDER — GUAIFENESIN ER 600 MG PO TB12: 600.0000 mg | ORAL_TABLET | Freq: Two times a day (BID) | ORAL | 0 refills | Status: AC

## 2020-07-12 MED ORDER — COMBIVENT RESPIMAT 20-100 MCG/ACT IN AERS: 1.0000 | INHALATION_SPRAY | Freq: Four times a day (QID) | RESPIRATORY_TRACT | 0 refills | Status: DC

## 2020-07-12 NOTE — ED Provider Notes (Signed)
[place]    CSN: [id number] Arrival date & time: 07/12/20  1809      History   Chief Complaint Chief Complaint  Patient presents with  . Cough  . Shortness of Breath    HPI Whitney Lowe is a 35 y.o. female comes to the urgent care with complaints of shortness of breath of 1 day duration, cough of 3 days duration and some wheezing 1 day duration.  Patient has a 18-pack-year history of smoking and quit smoking about 6 days ago.  She also has a history of Crohn's disease on 6 weekly injections of Entyvio.   Patient's cough sounds wet but she has minimal sputum and recently started experiencing some shortness of breath wheezing.  She denies any fever or chills.  No chest pain.  Patient is not vaccinated against COVID-19 virus.  She denies any sick contacts.  No chest pain but she has some chest tightness.  Cough is worse at night.  She denies any lower extremity swelling. HPI  Past Medical History:  Diagnosis Date  . ADHD    stopped abilify with preg  . ADHD (attention deficit hyperactivity disorder)   . Anemia   . Anxiety    not currently on meds  . Bipolar   . Cataract    Rt eye  . Complication of anesthesia    became combative   . Crohn's ileocolitis ([place]) 06/19/2016  . Eczema   . Fibroids    uterine  . Infection    UTI  . Ovarian cyst   . Periumbilical hernia     Patient Active Problem List   Diagnosis Date Noted  . Placental abruption 01/01/2019  . Tachycardia 12/01/2018  . Preterm premature rupture of membranes (PPROM), second trimester, antepartum 11/25/2018  . Antepartum placental abruption 10/05/2018  . Cervical mass 10/04/2018  . Fibroid uterus 10/04/2018  . Anemia affecting pregnancy, antepartum 09/06/2018  . Rubella non-immune status, antepartum 09/03/2018  . Rh negative, antepartum 09/03/2018  . Supervision of high risk pregnancy, antepartum 08/17/2018  . Long-term use of immunosuppressant medication-Stelara 07/21/2018  . Crohn's  ileocolitis ([place]) 06/19/2016    Past Surgical History:  Procedure Laterality Date  . CESAREAN SECTION N/A 01/01/2019   Procedure: CESAREAN SECTION;  Surgeon: Sloan Leiter, MD;  Location[facility] LD ORS;  Service: Obstetrics;  Laterality: N/A;  . COLONOSCOPY    . ESOPHAGOGASTRODUODENOSCOPY    . removal of cyst in ear     6th grade    OB History    Gravida  3   Para  1   Term      Preterm  1   AB  2   Living  1     SAB  1   IAB  1   Ectopic      Multiple  0   Live Births  1            Home Medications    Prior to Admission medications   Medication Sig Start Date End Date Taking? Authorizing Provider  doxycycline (VIBRAMYCIN) 100 MG capsule Take 1 capsule (100 mg total) by mouth 2 (two) times daily for 5 days. 07/12/20 07/17/20 Yes Izaiha Lo, Myrene Galas, MD  guaiFENesin (MUCINEX) 600 MG 12 hr tablet Take 1 tablet (600 mg total) by mouth 2 (two) times daily for 14 days. 07/12/20 07/26/20 Yes Etan Vasudevan, Myrene Galas, MD  Ipratropium-Albuterol (COMBIVENT RESPIMAT) 20-100 MCG/ACT AERS respimat Inhale 1 puff into the lungs every 6 (six) hours. 07/12/20  Yes Joye Wesenberg, Myrene Galas, MD  predniSONE (DELTASONE) 20 MG tablet Take 1 tablet (20 mg total) by mouth daily for 5 days. 07/12/20 07/17/20 Yes Chrystina Naff, Myrene Galas, MD  divalproex (DEPAKOTE ER) 500 MG 24 hr tablet Take 1,000 mg by mouth at bedtime. 06/01/20   [provider]  ENTYVIO 300 MG injection Inject into the vein. 07/04/20   [provider]    Family History Family History  Problem Relation Age of Onset  . Crohn's disease Mother        mom was adopted  . Heart disease Mother   . Anxiety disorder Mother   . Blindness Mother        Rt eye  . Diabetes Mellitus II Father   . COPD Father   . Hypertension Father   . Asthma Father   . ADD / ADHD Father   . Diabetes Father        type 2  . Anxiety disorder Father   . Depression Father   . Diabetes Sister        Type 2  . Colon cancer Neg Hx   . Esophageal cancer  Neg Hx   . Pancreatic cancer Neg Hx   . Stomach cancer Neg Hx   . Liver disease Neg Hx   . Rectal cancer Neg Hx     Social History Social History   Tobacco Use  . Smoking status: Former Smoker    Types: Cigars, Cigarettes    Quit date: 04/21/2018    Years since quitting: 2.2  . Smokeless tobacco: Never Used  . Tobacco comment: social smoker  Vaping Use  . Vaping Use: Never used  Substance Use Topics  . Alcohol use: Yes    Comment: rare  . Drug use: Not Currently    Types: Marijuana    Comment: last use february 2020-      Allergies   Benadryl [diphenhydramine]   Review of Systems Review of Systems  HENT: Negative.   Respiratory: Positive for cough, chest tightness, shortness of breath and wheezing.   Cardiovascular: Negative for chest pain and palpitations.  Gastrointestinal: Negative.   Musculoskeletal: Negative.   Skin: Negative.   Neurological: Negative.      Physical Exam Triage Vital Signs ED Triage Vitals  Enc Vitals Group     BP 07/12/20 1828 106/78     Pulse Rate 07/12/20 1828 (!) 102     Resp 07/12/20 1828 18     Temp 07/12/20 1828 97.7 F (36.5 C)     Temp Source 07/12/20 1828 Oral     SpO2 07/12/20 1828 100 %     Weight --      Height --      Head Circumference --      Peak Flow --      Pain Score 07/12/20 1826 0     Pain Loc --      Pain Edu? --      Excl. in [place]? --    No data found.  Updated Vital Signs BP 106/78 (BP Location: Right Arm)   Pulse (!) 102   Temp 97.7 F (36.5 C) (Oral)   Resp 18   LMP  (Within Days) Comment: 3-4 days  SpO2 100%   Breastfeeding No   Visual Acuity Right Eye Distance:   Left Eye Distance:   Bilateral Distance:    Right Eye Near:   Left Eye Near:    Bilateral Near:     Physical Exam Vitals  and nursing note reviewed.  Constitutional:      General: She is not in acute distress.    Appearance: She is not ill-appearing.  Cardiovascular:     Rate and Rhythm: Normal rate and regular rhythm.   Pulmonary:     Effort: Pulmonary effort is normal. No accessory muscle usage or respiratory distress.     Breath sounds: Examination of the right-lower field reveals decreased breath sounds and wheezing. Examination of the left-lower field reveals decreased breath sounds and wheezing. Decreased breath sounds and wheezing present. No rhonchi or rales.  Abdominal:     Palpations: Abdomen is soft.  Musculoskeletal:     Cervical back: Normal range of motion.  Neurological:     Mental Status: She is alert.      UC Treatments / Results  Labs (all labs ordered are listed, but only abnormal results are displayed) Labs Reviewed  SARS CORONAVIRUS 2 (TAT 6-24 HRS)  CBC WITH DIFFERENTIAL/PLATELET    EKG   Radiology DG Chest 2 View  Result Date: 07/12/2020 CLINICAL DATA:  35 year old female with cough. EXAM: CHEST - 2 VIEW COMPARISON:  Chest radiograph dated 08/26/2007. FINDINGS: The heart size and mediastinal contours are within normal limits. Both lungs are clear. The visualized skeletal structures are unremarkable. IMPRESSION: No active cardiopulmonary disease. Electronically Signed   By: Anner Crete M.D.   On: 07/12/2020 19:13    Procedures Procedures (including critical care time)  Medications Ordered in UC Medications - No data to display  Initial Impression / Assessment and Plan / UC Course  I have reviewed the triage vital signs and the nursing notes.  Pertinent labs & imaging results that were available during my care of the patient were reviewed by me and considered in my medical decision making (see chart for details).     1.  Acute COPD exacerbation: Combivent inhaler every 6 hours as needed Prednisone 20 mg orally daily for 5 days Mucinex 600 mg twice daily Doxycycline 100 mg twice daily for 5 days Chest x-ray is negative for acute lung infiltrate CBC, COVID-19 PCR test has been sent. Return precautions given. Final Clinical Impressions(s) / UC Diagnoses    Final diagnoses:  COPD exacerbation ([place])     Discharge Instructions     Take medications as directed If you experience worsening shortness of breath, wheezing, cough or purulence please return to the urgent care to be reevaluated. Congratulations on quitting smoking.   ED Prescriptions    Medication Sig Dispense Auth. Provider   predniSONE (DELTASONE) 20 MG tablet Take 1 tablet (20 mg total) by mouth daily for 5 days. 5 tablet Philana Younis, Myrene Galas, MD   Ipratropium-Albuterol (COMBIVENT RESPIMAT) 20-100 MCG/ACT AERS respimat Inhale 1 puff into the lungs every 6 (six) hours. 4 g Axcel Horsch, Myrene Galas, MD   guaiFENesin (MUCINEX) 600 MG 12 hr tablet Take 1 tablet (600 mg total) by mouth 2 (two) times daily for 14 days. 28 tablet Shakendra Griffeth, Myrene Galas, MD   doxycycline (VIBRAMYCIN) 100 MG capsule Take 1 capsule (100 mg total) by mouth 2 (two) times daily for 5 days. 10 capsule Ammar Moffatt, Myrene Galas, MD     PDMP not reviewed this encounter.   Chase Picket, MD 07/12/20 [phone]

## 2020-07-12 NOTE — ED Triage Notes (Signed)
Pt reports cough , shortness of breath on exertion x 2 days; legs cramping since this morning. Pt reports she quit smoking "cold [place]" 6 days ago. Denies fever.

## 2020-07-12 NOTE — Discharge Instructions (Signed)
Take medications as directed If you experience worsening shortness of breath, wheezing, cough or purulence please return to the urgent care to be reevaluated. Congratulations on quitting smoking.

## 2020-07-12 NOTE — ED Notes (Signed)
Obtained nasopharyngeal swab, verified name/dob.  Liquid in tube and lid placed tightly on tube.

## 2020-07-13 LAB — SARS CORONAVIRUS 2 (TAT 6-24 HRS): SARS Coronavirus 2: NEGATIVE

## 2020-07-17 ENCOUNTER — Other Ambulatory Visit: Payer: Self-pay | Admitting: Internal Medicine

## 2020-07-17 ENCOUNTER — Other Ambulatory Visit: Payer: Self-pay | Admitting: Pharmacy Technician

## 2020-07-17 DIAGNOSIS — K50819 Crohn's disease of both small and large intestine with unspecified complications: Secondary | ICD-10-CM

## 2020-07-17 NOTE — Telephone Encounter (Signed)
Please refer to [facility] for eval of asthma and possible "COPD" -- diagnosis given to patient by urgent care recently.  She has been started on Entyvio and is currently being infused at [facility].  I would like her to change infusion center to [facility] infusion. We need to see when her last infusion was, ensure she has completed induction.  I will place referral.

## 2020-07-18 ENCOUNTER — Telehealth: Payer: Self-pay | Admitting: Pharmacy Technician

## 2020-07-18 NOTE — Telephone Encounter (Signed)
Auth Submission: Payer: [place] [employer] Healthy Blue Medication & CPT/J Code(s) submitted: Entyvio (Vedolizumab) [id number] Route of submission (phone): [phone]  Prior auth NOT required for Entyvio infusion.   Confirmed with patient last infusion on 07/10/20 at [place], patient will be scheduled at [place] for future appointments, with next one around 09/04/20

## 2020-07-19 ENCOUNTER — Other Ambulatory Visit: Payer: Self-pay

## 2020-07-19 DIAGNOSIS — J441 Chronic obstructive pulmonary disease with (acute) exacerbation: Secondary | ICD-10-CM

## 2020-07-19 NOTE — Telephone Encounter (Signed)
Noted  

## 2020-07-19 NOTE — Telephone Encounter (Signed)
Referral sent to pulmonary for new dx of COPD at urgent care. Pts last treatment was 3/22 at [facility]. [facility] is working on getting he set up for [employer].

## 2020-07-19 NOTE — Telephone Encounter (Signed)
Whitney Lowe, [place]

## 2020-09-07 ENCOUNTER — Ambulatory Visit: Payer: Medicaid Other

## 2020-09-07 ENCOUNTER — Other Ambulatory Visit: Payer: Self-pay

## 2020-09-07 VITALS — BP 93/61 | HR 100 | Temp 98.3°F | Resp 18

## 2020-09-07 DIAGNOSIS — K50819 Crohn's disease of both small and large intestine with unspecified complications: Secondary | ICD-10-CM

## 2020-09-07 MED ORDER — HEPARIN SOD (PORK) LOCK FLUSH 100 UNIT/ML IV SOLN: 500.0000 [IU] | Freq: Once | INTRAVENOUS | Status: DC | PRN

## 2020-09-07 MED ORDER — SODIUM CHLORIDE 0.9% FLUSH: 3.0000 mL | Freq: Once | INTRAVENOUS | Status: DC | PRN

## 2020-09-07 MED ORDER — EPINEPHRINE 0.3 MG/0.3ML IJ SOAJ: 0.3000 mg | Freq: Once | INTRAMUSCULAR | Status: DC | PRN

## 2020-09-07 MED ORDER — METHYLPREDNISOLONE SODIUM SUCC 125 MG IJ SOLR: 125.0000 mg | Freq: Once | INTRAMUSCULAR | Status: DC | PRN

## 2020-09-07 MED ORDER — SODIUM CHLORIDE 0.9% FLUSH: 10.0000 mL | Freq: Once | INTRAVENOUS | Status: DC | PRN

## 2020-09-07 MED ORDER — ALBUTEROL SULFATE HFA 108 (90 BASE) MCG/ACT IN AERS: 2.0000 | INHALATION_SPRAY | Freq: Once | RESPIRATORY_TRACT | Status: DC | PRN

## 2020-09-07 MED ORDER — HEPARIN SOD (PORK) LOCK FLUSH 100 UNIT/ML IV SOLN: 250.0000 [IU] | Freq: Once | INTRAVENOUS | Status: DC | PRN

## 2020-09-07 MED ORDER — ANTICOAGULANT SODIUM CITRATE 4% (200MG/5ML) IV SOLN
5.0000 mL | Freq: Once | Status: DC | PRN
  Filled 2020-09-07: qty 5

## 2020-09-07 MED ORDER — FAMOTIDINE IN NACL 20-0.9 MG/50ML-% IV SOLN: 20.0000 mg | Freq: Once | INTRAVENOUS | Status: DC | PRN

## 2020-09-07 MED ORDER — ALTEPLASE 2 MG IJ SOLR: 2.0000 mg | Freq: Once | INTRAMUSCULAR | Status: DC | PRN

## 2020-09-07 MED ORDER — SODIUM CHLORIDE 0.9 % IV SOLN: Freq: Once | INTRAVENOUS | Status: DC | PRN

## 2020-09-07 MED ORDER — VEDOLIZUMAB 300 MG IV SOLR
300.0000 mg | Freq: Once | INTRAVENOUS | Status: DC
  Filled 2020-09-07: qty 5

## 2020-09-07 NOTE — Progress Notes (Signed)
Pt scheduled for infusion. Vital signs obtained. Nurse, Mervyn Skeeters, RN, attempted to start IV in left [facility]. Pt said site was painful and asked nurse to remove access. Ice pack applied to area due to mild redness. When writer prepared supplies to make a second attempt, pt refused. Says she would just reschedule because she was afraid and didn't want to attempt again. Nurse tried to calm pt and encourage treatment. Pt continued to decline and left. Pharmacy Tech made aware, and notified pharmacist. Supervisor, Jena Gauss also made aware.

## 2020-09-10 ENCOUNTER — Telehealth: Payer: Self-pay

## 2020-09-10 NOTE — Telephone Encounter (Signed)
She said the nurses were very nice but she preferred [facility] as she did not have the long wait.

## 2020-09-10 NOTE — Telephone Encounter (Signed)
Pt called and did not like the infusion center on [address] and wants to go back to [place]. The auth needs to be closed out or cancelled that the infusion center did. Dr. Norman Herrlich please let me know the pharmacist name that I can contact regarding this issue.

## 2020-09-10 NOTE — Telephone Encounter (Signed)
I am copying Whitney Lowe with pharmacy and also the director, Dr. Vaughan Browner Did she say why she did not have a good experience?

## 2020-09-11 NOTE — Telephone Encounter (Signed)
Were you able to get the auth cancelled out? I need to let [facility] know when this is done so they can get her back on their schedule and have the auth for their location.

## 2020-09-12 NOTE — Telephone Encounter (Signed)
No Prior Auth was required for Whitney Lowe [facility] [phone]) with Medicaid Healthy [employer].  [facility] should be able to move forward with scheduling her next appt. I have cancelled all future appts @ [place]. Kim.

## 2020-09-12 NOTE — Telephone Encounter (Signed)
Orpah Cobb Thanks for the clarification. I agree times are all reasonable  Vaughan Basta,  Please ensure orders in place at [facility] and if she did not receive Entyvio she needs to ASAP [facility]

## 2020-09-12 NOTE — Telephone Encounter (Signed)
Spoke with [facility] and they are aware.

## 2020-10-01 ENCOUNTER — Other Ambulatory Visit: Payer: Self-pay

## 2020-10-01 DIAGNOSIS — K50819 Crohn's disease of both small and large intestine with unspecified complications: Secondary | ICD-10-CM

## 2020-10-01 NOTE — Telephone Encounter (Signed)
Got her message  Zinc and Vit D are ok to take as supplement, Vit D3 dose is 2000 IU daily, for zinc 1 tablet daily sufficient She can be referred to [facility] (or other office that accepts her insurance) for eval of hair loss and skin check in setting of IBD on biologic She may need iron which likely could be given when she gets entyvio but in order to get it covered we need to check: CBC, ferritin + IBC panel  Thanks JMP

## 2020-10-18 ENCOUNTER — Institutional Professional Consult (permissible substitution): Payer: Medicaid Other | Admitting: Pulmonary Disease

## 2020-12-14 ENCOUNTER — Ambulatory Visit (HOSPITAL_BASED_OUTPATIENT_CLINIC_OR_DEPARTMENT_OTHER): Payer: Medicaid Other | Admitting: Family Medicine

## 2020-12-17 ENCOUNTER — Encounter (HOSPITAL_BASED_OUTPATIENT_CLINIC_OR_DEPARTMENT_OTHER): Payer: Self-pay | Admitting: Family Medicine

## 2021-01-04 ENCOUNTER — Ambulatory Visit (HOSPITAL_BASED_OUTPATIENT_CLINIC_OR_DEPARTMENT_OTHER): Payer: Medicaid Other | Admitting: Family Medicine

## 2021-01-07 ENCOUNTER — Encounter (HOSPITAL_BASED_OUTPATIENT_CLINIC_OR_DEPARTMENT_OTHER): Payer: Self-pay | Admitting: Family Medicine

## 2021-01-10 DIAGNOSIS — F4321 Adjustment disorder with depressed mood: Secondary | ICD-10-CM | POA: Diagnosis not present

## 2021-01-17 DIAGNOSIS — F4321 Adjustment disorder with depressed mood: Secondary | ICD-10-CM | POA: Diagnosis not present

## 2021-01-22 DIAGNOSIS — F4321 Adjustment disorder with depressed mood: Secondary | ICD-10-CM | POA: Diagnosis not present

## 2021-02-15 DIAGNOSIS — F310 Bipolar disorder, current episode hypomanic: Secondary | ICD-10-CM | POA: Diagnosis not present

## 2021-02-25 DIAGNOSIS — F310 Bipolar disorder, current episode hypomanic: Secondary | ICD-10-CM | POA: Diagnosis not present

## 2021-02-25 DIAGNOSIS — R5383 Other fatigue: Secondary | ICD-10-CM | POA: Diagnosis not present

## 2021-04-09 ENCOUNTER — Encounter: Payer: Self-pay | Admitting: Internal Medicine

## 2021-04-09 ENCOUNTER — Other Ambulatory Visit: Payer: Self-pay

## 2021-04-09 DIAGNOSIS — K50819 Crohn's disease of both small and large intestine with unspecified complications: Secondary | ICD-10-CM

## 2021-04-09 NOTE — Telephone Encounter (Signed)
Pt aware, lab orders in epic, script [facility]. Pt scheduled to see Carl Best NP 04/19/21 at 11am. Pt had Entyvio at [facility] on 02/19/21 and is scheduled for her next infusion on 12/274/22.

## 2021-04-09 NOTE — Telephone Encounter (Signed)
Patient with history of ileocolonic Crohn's disease Patient had been switched to [facility]  I have several recommendations and questions She was getting Entyvio at [place] infusion --we need to check with him to see if she is getting this as scheduled and when her last dose was She needs CBC, ferritin plus IBC, CMP, quantiferon gold and fecal calprotectin Symptoms could be secondary to anal fissure but could also relate to active Crohn's disease --I recommend she try RectiCare over-the-counter for perianal pain; please prescribe diltiazem gel 2% (apply a pea-sized amount to the first knuckle into the anal canal twice daily for fissure) She needs an office follow-up with me or APP, next available  Further recommendations after we see lab work and fecal calprotectin and we determine when her last Entyvio infusion was

## 2021-04-16 ENCOUNTER — Other Ambulatory Visit: Payer: Self-pay

## 2021-04-19 ENCOUNTER — Other Ambulatory Visit (INDEPENDENT_AMBULATORY_CARE_PROVIDER_SITE_OTHER): Payer: Medicaid Other

## 2021-04-19 ENCOUNTER — Ambulatory Visit: Payer: Medicaid Other | Admitting: Nurse Practitioner

## 2021-04-19 ENCOUNTER — Encounter: Payer: Self-pay | Admitting: Nurse Practitioner

## 2021-04-19 VITALS — BP 102/60 | HR 85 | Ht <= 58 in | Wt 112.4 lb

## 2021-04-19 DIAGNOSIS — K50819 Crohn's disease of both small and large intestine with unspecified complications: Secondary | ICD-10-CM

## 2021-04-19 DIAGNOSIS — K5080 Crohn's disease of both small and large intestine without complications: Secondary | ICD-10-CM | POA: Diagnosis not present

## 2021-04-19 LAB — CBC WITH DIFFERENTIAL/PLATELET
Basophils Absolute: 0.1 10*3/uL (ref 0.0–0.1)
Basophils Relative: 0.8 % (ref 0.0–3.0)
Eosinophils Absolute: 0.5 10*3/uL (ref 0.0–0.7)
Eosinophils Relative: 7.9 % — ABNORMAL HIGH (ref 0.0–5.0)
HCT: 40.2 % (ref 36.0–46.0)
Hemoglobin: 13.2 g/dL (ref 12.0–15.0)
Lymphocytes Relative: 51.7 % — ABNORMAL HIGH (ref 12.0–46.0)
Lymphs Abs: 3.4 10*3/uL (ref 0.7–4.0)
MCHC: 32.8 g/dL (ref 30.0–36.0)
MCV: 87.3 fl (ref 78.0–100.0)
Monocytes Absolute: 0.4 10*3/uL (ref 0.1–1.0)
Monocytes Relative: 6.3 % (ref 3.0–12.0)
Neutro Abs: 2.2 10*3/uL (ref 1.4–7.7)
Neutrophils Relative %: 33.3 % — ABNORMAL LOW (ref 43.0–77.0)
Platelets: 443 10*3/uL — ABNORMAL HIGH (ref 150.0–400.0)
RBC: 4.6 Mil/uL (ref 3.87–5.11)
RDW: 14.5 % (ref 11.5–15.5)
WBC: 6.6 10*3/uL (ref 4.0–10.5)

## 2021-04-19 LAB — COMPREHENSIVE METABOLIC PANEL
ALT: 8 U/L (ref 0–35)
AST: 14 U/L (ref 0–37)
Albumin: 3.8 g/dL (ref 3.5–5.2)
Alkaline Phosphatase: 55 U/L (ref 39–117)
BUN: 9 mg/dL (ref 6–23)
CO2: 28 mEq/L (ref 19–32)
Calcium: 9 mg/dL (ref 8.4–10.5)
Chloride: 105 mEq/L (ref 96–112)
Creatinine, Ser: 0.82 mg/dL (ref 0.40–1.20)
GFR: 91.67 mL/min (ref >60.00)
Glucose, Bld: 78 mg/dL (ref 70–99)
Potassium: 4 mEq/L (ref 3.5–5.1)
Sodium: 138 mEq/L (ref 135–145)
Total Bilirubin: 0.3 mg/dL (ref 0.2–1.2)
Total Protein: 7.6 g/dL (ref 6.0–8.3)

## 2021-04-19 LAB — IBC + FERRITIN
Ferritin: 8.8 ng/mL — ABNORMAL LOW (ref 10.0–291.0)
Iron: 40 ug/dL — ABNORMAL LOW (ref 42–145)
Saturation Ratios: 10.2 % — ABNORMAL LOW (ref 20.0–50.0)
TIBC: 392 ug/dL (ref 250.0–450.0)
Transferrin: 280 mg/dL (ref 212.0–360.0)

## 2021-04-19 NOTE — Progress Notes (Signed)
04/19/2021 Whitney Lowe [id number] 27-Nov-1985   Chief Complaint: Rectal pain and bleeding   History of Present Illness: Whitney Lowe is a 35 year old female with a past medical history bipolar depression, ADHD, uterine fibroids, C. Diff 2019, erythema nodosum and Crohn's ileocolitis initially diagnosed March 2018.  She was intolerant to immunomodulators. 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  She was on Stelara for about one year which was ineffective then switched to [facility] Feb or March 2022. She is followed by Whitney Lowe. She is on Entyvio every 8 weeks, next infusion is scheduled later today. She presents to our office today for further evaluation regarding rectal pain and bleeding which started about 3 weeks ago. She suspects she has anal fissures as her mother has a history of Crohn's disease with associated anal fissures. She contacted Whitney Lowe and she was prescribed Diltiazem fissure cream and Recticare ointment. She could not afford both of these medications so she purchased the Recticare 04/11/2021 and has used it at least twice daily with less anal pain. She continues to have bright red rectal bleeding after each bowel movement, sees blood on the toilet tissue, on the stool and drops of blood in the toilet water. She intends to pick up the Diltiazem fissure ointment today. She is also using Vaseline to the anal area with minor relief. No purulent anorectal drainage. No fever, sweats or chills. She is passing a soft to mudlike  brown stool 2 to 3 times daily. No watery diarrhea. No straining. No tenesmus. No abdominal pain or cramping. No N/V.  She took [facility] powder on 12/25 usually no NSAIDS. She underwent an EGD and colonoscopy 02/29/2020, see results below.   EGD 02/29/2020 --normal esophagus. Multiple diminutive erosions in the incisura and gastric antrum. Normal duodenum. Biopsies from the duodenum were unremarkable. Gastric biopsy showed slight chronic inflammation without H. pylori.    Colonoscopy 02/29/2020 -- ileocecal valve was stenosed and the terminal ileum not able to be examined. There were aphthous ulcerations scattered throughout the colon consistent with active Crohn's disease. Her simple endoscopic score for Crohn's was 21. Distal rectum and anal canal were normal. Biopsies showed minimal active inflammation without granulomas. Recall colonoscopy due 02/2022.  Small bowel enterography 07/21/2019: FINDINGS: Lower chest: Lung bases are clear.   Hepatobiliary: Liver is within normal limits.  No hepatic steatosis.   Status post cholecystectomy. No intrahepatic or extrahepatic ductal dilatation.   Pancreas:  Within normal limits.   Spleen:  Within normal limits.   Adrenals/Urinary Tract:  Adrenal glands are within normal limits.   Kidneys are within normal limits.  No hydronephrosis.   Bladder is within normal limits.   Stomach/Bowel: Stomach is within normal limits.   Wall thickening with mucosal hyperenhancement involving a long segment of the distal/terminal ileum (series 2/images 26, 28, and 32), suggesting active inflammatory Crohn's disease. Associated luminal narrowing, suggesting there may be some degree of fibrostenosing component.   However, there is no evidence of small-bowel obstruction. No visualized fistula or abscess.   Ascending and transverse colon are mildly prominent/dilated, favoring adynamic colonic ileus. No colonic wall thickening or inflammatory changes.   Vascular/Lymphatic:  No evidence of abdominal aortic aneurysm.   No suspicious abdominopelvic lymphadenopathy.   Reproductive: Uterus is notable for a pedunculated 2.2 x 3.2 cm fibroid along the right posterior uterine body (series 2/image 34).   Bilateral ovaries are within normal limits.   Other:  Trace pelvic ascites (series 2/image 36).   Musculoskeletal: No focal osseous lesions.   IMPRESSION:  Active inflammatory Crohn's disease involving a long segment of  the distal/terminal ileum. Possible associated fibrostenosing component.   No evidence of bowel obstruction, fistula, or abscess.   Mildly prominent right colon, without wall thickening or inflammatory changes, favoring adynamic colonic ileus.     Past Medical History:  Diagnosis Date   ADHD    stopped abilify with preg   ADHD (attention deficit hyperactivity disorder)    Anemia    Anxiety    not currently on meds   Bipolar    Cataract    Rt eye   Complication of anesthesia    became combative    Crohn's ileocolitis ([place]) 06/19/2016   Eczema    Fibroids    uterine   Infection    UTI   Ovarian cyst    Periumbilical hernia    Past Surgical History:  Procedure Laterality Date   CESAREAN SECTION N/A 01/01/2019   Procedure: CESAREAN SECTION;  Surgeon: Sloan Leiter, MD;  Location: [facility] LD ORS;  Service: Obstetrics;  Laterality: N/A;   COLONOSCOPY     ESOPHAGOGASTRODUODENOSCOPY     removal of cyst in ear     6th grade   Current Outpatient Medications on File Prior to Visit  Medication Sig Dispense Refill   ENTYVIO 300 MG injection Inject into the vein.     No current facility-administered medications on file prior to visit.   Allergies  Allergen Reactions   Benadryl [Diphenhydramine]     Pt reported new onset tightness at her throat shortly after receiving 25 mg Iv Benadryl. She also was noticeably less oriented than previous   Current Medications, Allergies, Past Medical History, Past Surgical History, Family History and Social History were reviewed in [employer] record.  Review of Systems:   Constitutional: Negative for fever, sweats, chills or weight loss.  Respiratory: Negative for shortness of breath.   Cardiovascular: Negative for chest pain, palpitations and leg swelling.  Gastrointestinal: See HPI.  Musculoskeletal: Negative for back pain or muscle aches.  Neurological: Negative for dizziness, headaches or paresthesias.   Physical  Exam: BP 102/60    Pulse 85    Ht _0  (1.473 m)    Wt 112 lb 6.4 oz (51 kg)    BMI 23.49 kg/m  General: 35 year old female in NAD. Head: Normocephalic and atraumatic. Eyes: No scleral icterus. Conjunctiva pink . Ears: Normal auditory acuity. Mouth: Dentition intact. No ulcers or lesions.  Lungs: Clear throughout to auscultation. Heart: Regular rate and rhythm, no murmur. Abdomen: Soft, nontender and nondistended. No masses or hepatomegaly. Normal bowel sounds x 4 quadrants.  Rectal: Patient declined rectal exam, she became tearful when rectal exam discussed. Past painful rectal exam resulting in severe anxiety. No history of abuse.  Musculoskeletal: Symmetrical with no gross deformities. Extremities: No edema. Neurological: Alert oriented x 4. No focal deficits.  Psychological: Alert and cooperative. Normal mood and affect  Assessment and Recommendations:  32) 35 year old female with Crohn's ileocolitis, likely has Crohn's anorectal disease with rectal pain and bleeding. Patient declined rectal exam.  -CBC, CMP, CRP,  Iron panel, quantiferon gold, fecal calprotectin level and Vedolizumab drug and antibody level now -Proceed with Enyvio infusion scheduled later today -Start Diltiazem 2% fissure cream as previously prescribed by WhitneyPyrtle (patient has funds at this time to pay for this prescription) -Discussed flex sig with MAC to rule out anorectal Crohn's disease, to discuss further with Whitney Lowe -Pelvic MRI to rule out anorectal abscess if  she develops fever, purulent rectal drainage or worsening anorectal pain -Patient to contact office if symptoms worsen  -Push fluids, diet as tolerated   Today's encounter was 25 minutes including precharting, record/result review, history/exam, formulating a treatment plan/follow up and documentation.

## 2021-04-19 NOTE — Patient Instructions (Signed)
LABS:  Lab work has been ordered for you today. Our lab is located in the basement. Press "B" on the elevator. The lab is located at the first door on the left as you exit the elevator.  HEALTHCARE LAWS AND MY CHART RESULTS: Due to recent changes in healthcare laws, you may see the results of your imaging and laboratory studies on MyChart before your provider has had a chance to review them.   We understand that in some cases there may be results that are confusing or concerning to you. Not all laboratory results come back in the same time frame and the provider may be waiting for multiple results in order to interpret others.  Please give [place] 48 hours in order for your provider to thoroughly review all the results before contacting the office for clarification of your results.   RECOMMENDATIONS: Use Recticare as previously directed 4 times a day. Use the Diltiazem 2% fissure ointment as directed 4 times a day for 4-6 weeks. Call our office is you develop fever, worsening rectal pain/bleeding or purulent rectal discharge.  It was great seeing you today! Thank you for entrusting me with your care and choosing [facility].  Noralyn Pick, CRNP  The [place] [facility] providers would like to encourage you to use [facility] to communicate with providers for non-urgent requests or questions.  Due to long hold times on the telephone, sending your provider a message by [facility] may be faster and more efficient way to get a response. Please allow 48 business hours for a response.  Please remember that this is for non-urgent requests/questions. If you are age 26 or older, your body mass index should be between 23-30. Your Body mass index is 23.49 kg/m. If this is out of the aforementioned range listed, please consider follow up with your Primary Care Provider.  If you are age 13 or younger, your body mass index should be between 19-25. Your Body mass index is 23.49 kg/m. If this is out of the  aformentioned range listed, please consider follow up with your Primary Care Provider.

## 2021-04-20 ENCOUNTER — Encounter: Payer: Self-pay | Admitting: Nurse Practitioner

## 2021-04-22 ENCOUNTER — Other Ambulatory Visit: Payer: Self-pay | Admitting: Internal Medicine

## 2021-04-22 DIAGNOSIS — D500 Iron deficiency anemia secondary to blood loss (chronic): Secondary | ICD-10-CM

## 2021-04-22 DIAGNOSIS — D509 Iron deficiency anemia, unspecified: Secondary | ICD-10-CM | POA: Insufficient documentation

## 2021-04-22 NOTE — Progress Notes (Signed)
IDA in pt with Crohns

## 2021-04-22 NOTE — Progress Notes (Signed)
Addendum: Reviewed and agree with assessment and management plan. Results reviewed, see result note for further plans IV iron ordered Follow-up [place] blood level and rule out antibodies Flex sig if not getting better Visit with me next available Harveen Flesch, Lajuan Lines, MD

## 2021-04-23 ENCOUNTER — Other Ambulatory Visit: Payer: Self-pay | Admitting: Pharmacy Technician

## 2021-04-23 ENCOUNTER — Telehealth: Payer: Self-pay | Admitting: Pharmacy Technician

## 2021-04-23 LAB — QUANTIFERON-TB GOLD PLUS
Mitogen-NIL: >10 IU/mL
NIL: 0.06 IU/mL
QuantiFERON-TB Gold Plus: NEGATIVE
TB1-NIL: 0.01 IU/mL
TB2-NIL: 0.03 IU/mL

## 2021-04-23 NOTE — Telephone Encounter (Signed)
Auth Submission: no auth needed Payer: [place] MEDICAID/HEALTHY BLUE Medication & CPT/J Code(s) submitted: Feraheme (ferumoxytol) [id number] Route of submission (phone, fax, portal): phone Auth type: Buy/Bill Units/visits requested: 2 Reference number: [id number] Approval from: 04/23/21 to 07/20/21   Patient will be scheduled as soon as possible

## 2021-04-24 ENCOUNTER — Other Ambulatory Visit: Payer: Medicaid Other

## 2021-04-25 ENCOUNTER — Other Ambulatory Visit: Payer: Medicaid Other

## 2021-04-25 DIAGNOSIS — K50819 Crohn's disease of both small and large intestine with unspecified complications: Secondary | ICD-10-CM | POA: Diagnosis not present

## 2021-04-29 ENCOUNTER — Encounter: Payer: Self-pay | Admitting: Internal Medicine

## 2021-04-29 NOTE — Telephone Encounter (Signed)
Ok for IV iron at [facility] is approved and covered for her

## 2021-04-30 LAB — SERIAL MONITORING

## 2021-04-30 LAB — VEDOLIZUMAB AND ANTI-VEDO AB
Anti-Vedolizumab Antibody: <25 ng/mL
Vedolizumab: 4.1 ug/mL

## 2021-04-30 NOTE — Telephone Encounter (Signed)
Feraheme 510 mg IV x 2, separated by 7 days CBC, IBC + ferritin in 8 weeks after 2nd infusions

## 2021-05-01 ENCOUNTER — Telehealth: Payer: Self-pay

## 2021-05-01 NOTE — Telephone Encounter (Signed)
Infed is acceptable but I need pharmacy to help dose it Goal hemoglobin 14.0

## 2021-05-01 NOTE — Telephone Encounter (Signed)
Inbound call from [place] at [place]. States insurance feraheme because she does not have chronic kidney disease. States she would have to have tried oral iron or Infed in order to be covered. [facility] contact number [phone] ext [phone]

## 2021-05-01 NOTE — Telephone Encounter (Signed)
See note below regarding IV Iron. The feraheme is not being covered. What other form of iron would you like to have her receive.

## 2021-05-02 ENCOUNTER — Other Ambulatory Visit: Payer: Self-pay

## 2021-05-02 LAB — CALPROTECTIN, FECAL: Calprotectin, Fecal: 823 ug/g — ABNORMAL HIGH (ref 0–120)

## 2021-05-02 MED ORDER — PREDNISONE 10 MG PO TABS: ORAL_TABLET | ORAL | 0 refills | Status: DC

## 2021-05-02 NOTE — Progress Notes (Signed)
Noted. Prednisone RX was sent by [facility].  Patient has appoint with Dr. Hilarie Fredrickson on 05/22/2021.

## 2021-05-02 NOTE — Telephone Encounter (Signed)
Records and new tx plan faxed to [facility] for Infed, request pharmacy to dose.

## 2021-05-02 NOTE — Telephone Encounter (Signed)
Yes, she tried and it caused intolerable GI side-effects, abd pain, nausea and intermittent vomiting

## 2021-05-06 ENCOUNTER — Other Ambulatory Visit: Payer: Self-pay | Admitting: Internal Medicine

## 2021-05-06 MED ORDER — PREDNISONE 10 MG PO TABS: ORAL_TABLET | ORAL | 0 refills | Status: AC

## 2021-05-15 DIAGNOSIS — K5080 Crohn's disease of both small and large intestine without complications: Secondary | ICD-10-CM | POA: Diagnosis not present

## 2021-05-22 ENCOUNTER — Ambulatory Visit: Payer: Medicaid Other | Admitting: Internal Medicine

## 2021-05-22 ENCOUNTER — Encounter: Payer: Self-pay | Admitting: Internal Medicine

## 2021-05-22 VITALS — HR 104 | Ht <= 58 in | Wt 106.4 lb

## 2021-05-22 DIAGNOSIS — D500 Iron deficiency anemia secondary to blood loss (chronic): Secondary | ICD-10-CM

## 2021-05-22 DIAGNOSIS — K50811 Crohn's disease of both small and large intestine with rectal bleeding: Secondary | ICD-10-CM

## 2021-05-22 MED ORDER — DILTIAZEM GEL 2 %: 1.0000 "application " | Freq: Two times a day (BID) | CUTANEOUS | 1 refills | Status: DC

## 2021-05-22 MED ORDER — PLENVU 140 G PO SOLR: 1.0000 | ORAL | 0 refills | Status: DC

## 2021-05-22 NOTE — Patient Instructions (Addendum)
We have sent the following medications to your pharmacy for you to pick up at your convenience: Plenvu, Hydrocodone   We have sent a prescription for Diltiazem gel to [facility]. [facility]'s information is below: Address: [address] [place], [place] [address]  [phone]  *Please DO NOT go directly from our office to pick up this medication! Give the pharmacy 1 day to process the prescription as this is compounded and takes time to make.  Continue using Recti Care as needed.    If you are age 47 or younger, your body mass index should be between 19-25. Your Body mass index is 22.24 kg/m. If this is out of the aformentioned range listed, please consider follow up with your Primary Care Provider.   ________________________________________________________  The [place] [facility] providers would like to encourage you to use [facility] to communicate with providers for non-urgent requests or questions.  Due to long hold times on the telephone, sending your provider a message by [facility] may be a faster and more efficient way to get a response.  Please allow 48 business hours for a response.  Please remember that this is for non-urgent requests.    Thank you for choosing me and [facility].  Dr. Ulice Dash Pyrtle

## 2021-05-23 ENCOUNTER — Encounter: Payer: Self-pay | Admitting: Internal Medicine

## 2021-05-23 MED ORDER — HYDROCODONE-ACETAMINOPHEN 5-325 MG PO TABS: 1.0000 | ORAL_TABLET | Freq: Three times a day (TID) | ORAL | 0 refills | Status: DC | PRN

## 2021-05-23 NOTE — Progress Notes (Signed)
Subjective:    Patient ID: Whitney Lowe, female    DOB: 1985/11/17, 36 y.o.   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 difficile, uterine fibroids, history of bipolar depression, ADHD who is here for follow-up.  She was last seen in late December 2022 by [facility] to evaluate rectal bleeding.  She is here alone today.  After her last office visit she had blood work including a fecal calprotectin which was elevated.  Her vedolizumab level was slightly low around 4 at trough but without antibodies.  She was prescribed diltiazem gel for probable fissure but the question of perianal Crohn's disease is recommended.  Due to ongoing symptoms and since that office visit we recommended a prednisone taper 40 mg daily decreasing by 5 mg weekly until off.  We also ordered Injectafer and she is scheduled for her second infusion later today.  Her last Entyvio infusion was 04/19/2022.  She reports that she has not really used the prednisone taper as prescribed because she is concerned about the negative effects of steroids.  She is taking 5 mg 1 or 2 days/week.  She states a single dose seems to help her for a day or 2.  She continues to have loose stools with blood.  Stools vary from formed to loose but are predominantly loose.  There is blood with the stool but also with wiping on the toilet tissue.  There is stinging and discomfort when stool passes.  She is not really having abdominal pain.  Her appetite fluctuates and has been somewhat decreased for the last several days.  She has not lost a significant amount of weight.  She denies nausea and vomiting.  She is using RectiCare with the diltiazem which she does think definitively helps.   Review of Systems As per HPI, otherwise negative  Current Medications, Allergies, Past Medical History, Past Surgical History,  Family History and Social History were reviewed in [employer] record.    Objective:   Physical Exam Pulse (!) 104    Ht 4' 10"  (1.473 m)    Wt 106 lb 6.4 oz (48.3 kg)    SpO2 98%    BMI 22.24 kg/m  Gen: awake, alert, NAD HEENT: anicteric  CV: RRR, no mrg Pulm: CTA b/l Abd: soft, NT/ND, +BS throughout Rectal: Patient declines rectal exam today Ext: no c/c/e Neuro: nonfocal  CMP     Component Value Date/Time   NA 138 04/19/2021 1156   K 4.0 04/19/2021 1156   CL 105 04/19/2021 1156   CO2 28 04/19/2021 1156   GLUCOSE 78 04/19/2021 1156   BUN 9 04/19/2021 1156   CREATININE 0.82 04/19/2021 1156   CALCIUM 9.0 04/19/2021 1156   PROT 7.6 04/19/2021 1156   ALBUMIN 3.8 04/19/2021 1156   AST 14 04/19/2021 1156   ALT 8 04/19/2021 1156   ALKPHOS 55 04/19/2021 1156   BILITOT 0.3 04/19/2021 1156   GFRNONAA >60 04/20/2020 1958   GFRAA >60 01/07/2019 1417   CBC Latest Ref Rng & Units 04/19/2021 07/12/2020 04/20/2020  WBC 4.0 - 10.5 K/uL 6.6 9.7 6.8  Hemoglobin 12.0 - 15.0 g/dL 13.2 13.9 12.8  Hematocrit 36.0 - 46.0 % 40.2 41.3 40.1  Platelets 150.0 - 400.0 K/uL 443.0(H) 447(H) 386   Iron/TIBC/Ferritin/ %Sat    Component Value Date/Time   IRON 40 (L) 04/19/2021 1156  TIBC 392.0 04/19/2021 1156   FERRITIN 8.8 (L) 04/19/2021 1156   IRONPCTSAT 10.2 (L) 04/19/2021 1156    Vedolizumab trough 4.1 QuantiFERON gold negative Fecal calprotectin 823     Assessment & Plan:  36 year old female with a history of ileocolonic Crohn's disease diagnosed in March 2018, history of erythema nodosum, history of C. difficile, uterine fibroids, history of bipolar depression, ADHD who is here for follow-up.   Active ileocolonic Crohn's disease with possible recent perianal involvement --she has been on Entyvio now since early 2022.  I do think it has been helpful but she has continued to have signs and symptoms of active disease including rectal bleeding but also elevated fecal  calprotectin.  She has not used the steroid taper as recommended over concern of side effects.  We discussed treatment which includes increasing Entyvio dosing frequency given relatively low trough levels.  Fortunately she does not have antibodies to [facility].  She has been intolerant to immunomodulator therapy.  We need objective assessment of current disease activity and I recommended colonoscopy.  Also encouraged her to consider taking the prednisone taper but start at 20 mg and decrease by 5 mg every 7 to 10 days --Colonoscopy; we reviewed the risk, benefits and alternatives and she is agreeable and wishes to proceed --Continue diltiazem gel and RectiCare --Hydrocodone 5/325 mg 1 tablet 3 times daily as needed severe pain for active Crohn's disease --Continue Entyvio but increase to 300 mg every 6 weeks due to low trough level --May at some point need to consider meeting with surgical team for resection if ongoing active disease despite medical therapy.  Infliximab would also be an option in the future.  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  Check CBC and iron studies in about 2 months  40 minutes total spent today including patient facing time, coordination of care, reviewing medical history/procedures/pertinent radiology studies, and documentation of the encounter.

## 2021-05-23 NOTE — Progress Notes (Signed)
New treatment plan faxed to [facility] increasing the frequency of Entyvio to every 6 weeks.

## 2021-06-03 ENCOUNTER — Other Ambulatory Visit: Payer: Self-pay

## 2021-06-03 ENCOUNTER — Encounter: Payer: Self-pay | Admitting: Internal Medicine

## 2021-06-03 ENCOUNTER — Ambulatory Visit (AMBULATORY_SURGERY_CENTER): Payer: Medicaid Other | Admitting: Internal Medicine

## 2021-06-03 VITALS — BP 109/72 | HR 85 | Temp 97.8°F | Resp 12 | Ht <= 58 in | Wt 106.0 lb

## 2021-06-03 DIAGNOSIS — K50811 Crohn's disease of both small and large intestine with rectal bleeding: Secondary | ICD-10-CM

## 2021-06-03 DIAGNOSIS — K50819 Crohn's disease of both small and large intestine with unspecified complications: Secondary | ICD-10-CM

## 2021-06-03 DIAGNOSIS — K5150 Left sided colitis without complications: Secondary | ICD-10-CM

## 2021-06-03 DIAGNOSIS — K5090 Crohn's disease, unspecified, without complications: Secondary | ICD-10-CM | POA: Diagnosis not present

## 2021-06-03 DIAGNOSIS — D500 Iron deficiency anemia secondary to blood loss (chronic): Secondary | ICD-10-CM

## 2021-06-03 DIAGNOSIS — K529 Noninfective gastroenteritis and colitis, unspecified: Secondary | ICD-10-CM

## 2021-06-03 DIAGNOSIS — F419 Anxiety disorder, unspecified: Secondary | ICD-10-CM | POA: Diagnosis not present

## 2021-06-03 MED ORDER — SODIUM CHLORIDE 0.9 % IV SOLN: 500.0000 mL | INTRAVENOUS | Status: DC

## 2021-06-03 NOTE — Progress Notes (Signed)
Patient for colonoscopy today. See office note dated 05/22/2021 for details Colonoscopy today to evaluate disease activity and known small and large bowel Crohn's disease  She remains appropriate for [place] colonoscopy this morning

## 2021-06-03 NOTE — Progress Notes (Signed)
.  rm

## 2021-06-03 NOTE — Progress Notes (Signed)
Pt's states no medical or surgical changes since previsit or office visit. 

## 2021-06-03 NOTE — Progress Notes (Signed)
Report given to PACU, vss 

## 2021-06-03 NOTE — Op Note (Signed)
[place] Patient Name: Whitney Lowe Procedure Date: 06/03/2021 10:24 AM MRN: [id number] Endoscopist: [name] , MD Age: 36 Referring MD:  Date of Birth: 08/18/1985 Gender: Female Account #: [id number] Procedure:                Colonoscopy Indications:              Last colonoscopy: November 2021, Disease activity                            assessment of Crohn's disease of the small bowel                            and colon, Assess therapeutic response to therapy                            of Crohn's disease of the small bowel and colon Medicines:                Monitored Anesthesia Care Procedure:                Pre-Anesthesia Assessment:                           - Prior to the procedure, a History and Physical                            was performed, and patient medications and                            allergies were reviewed. The patient's tolerance of                            previous anesthesia was also reviewed. The risks                            and benefits of the procedure and the sedation                            options and risks were discussed with the patient.                            All questions were answered, and informed consent                            was obtained. Prior Anticoagulants: The patient has                            taken no previous anticoagulant or antiplatelet                            agents. ASA Grade Assessment: II - A patient with                            mild systemic disease. After reviewing the risks  and benefits, the patient was deemed in                            satisfactory condition to undergo the procedure.                           After obtaining informed consent, the colonoscope                            was passed under direct vision. Throughout the                            procedure, the patient's blood pressure, pulse, and                            oxygen  saturations were monitored continuously. The                            0441 PCF-H190TL Slim SB Colonoscope was introduced                            through the anus and advanced to the cecum,                            identified by appendiceal orifice and ileocecal                            valve. The colonoscopy was performed without                            difficulty. The patient tolerated the procedure                            well. The quality of the bowel preparation was                            excellent. The ileocecal valve, appendiceal                            orifice, and rectum were photographed. Scope In: 10:34:05 AM Scope Out: 10:51:23 AM Scope Withdrawal Time: 0 hours 11 minutes 56 seconds  Total Procedure Duration: 0 hours 17 minutes 18 seconds  Findings:                 The digital rectal exam was normal.                           The Simple Endoscopic Score for Crohn's Disease was                            determined based on the endoscopic appearance of                            the mucosa in the following segments:                           -  Ileum: Findings include narrowing at ICV that                            cannot be passed. Segment score: 3. There is                            inflammation at the IC valve which was biopsied                            (Jar 1).                           - Right Colon: Findings include no ulcers present,                            no ulcerated surfaces, no affected surfaces and no                            narrowings. Segment score: 0.                           - Transverse Colon: Findings include no ulcers                            present, no ulcerated surfaces, no affected                            surfaces and no narrowings. Segment score: 0.                           - Left Colon: Findings include no ulcers present,                            no ulcerated surfaces, no affected surfaces and no                             narrowings. Segment score: 0.                           - Rectum: Findings include no ulcers present, no                            ulcerated surfaces, no affected surfaces and no                            narrowings. Segment score: 0.                           - Total SES-CD aggregate score: 3. Biopsies were                            taken with a cold forceps for histology.  Mild inflammation characterized by erythema was                            found at the dentate line without ulceration or                            evidence of fistula.                           No additional abnormalities were found on                            retroflexion. Complications:            No immediate complications. Estimated Blood Loss:     Estimated blood loss was minimal. Impression:               - Simple Endoscopic Score for Crohn's Disease: 3;                            mucosal inflammatory changes secondary to Crohn's                            disease at IC valve. Colonic mucosa with scarring                            but no evidence of active inflammation consistent                            with Crohn's colitis in remission Whitney Lowe).                            Biopsied (Jar 1 ileum, Jar 2 right/transverse                            colon, Jar 3 left colon).                           - Mild inflammation was found at the dentate                            line/anus secondary to mild Crohn's. Recommendation:           - Patient has a contact number available for                            emergencies. The signs and symptoms of potential                            delayed complications were discussed with the                            patient. Return to normal activities tomorrow.                            Written discharge instructions were provided to  the                            patient.                           - Resume previous diet.                            - Continue present medications.                           Whitney Lowe is working very well for colonic control                            of Crohn's, however the ileum could not be examined                            and there is inflammation at the ICV.                           - MR enterography recommended to evaluate ileal                            Crohn's disease.                           - Await pathology results.                           - Office follow-up in about 3 months (if not                            scheduled already).                           - Repeat colonoscopy is recommended. The                            colonoscopy date will be determined after pathology                            results from today's exam become available for                            review. [name], MD 06/03/2021 11:00:15 AM This report has been signed electronically.

## 2021-06-03 NOTE — Patient Instructions (Addendum)
Follow-up appointment in 3 months. My office will contact you to schedule this.   YOU HAD AN ENDOSCOPIC PROCEDURE TODAY AT [facility]:   Refer to the procedure report that was given to you for any specific questions about what was found during the examination.  If the procedure report does not answer your questions, please call your gastroenterologist to clarify.  If you requested that your care partner not be given the details of your procedure findings, then the procedure report has been included in a sealed envelope for you to review at your convenience later.  YOU SHOULD EXPECT: Some feelings of bloating in the abdomen. Passage of more gas than usual.  Walking can help get rid of the air that was put into your GI tract during the procedure and reduce the bloating. If you had a lower endoscopy (such as a colonoscopy or flexible sigmoidoscopy) you may notice spotting of blood in your stool or on the toilet paper. If you underwent a bowel prep for your procedure, you may not have a normal bowel movement for a few days.  Please Note:  You might notice some irritation and congestion in your nose or some drainage.  This is from the oxygen used during your procedure.  There is no need for concern and it should clear up in a day or so.  SYMPTOMS TO REPORT IMMEDIATELY:  Following lower endoscopy (colonoscopy or flexible sigmoidoscopy):  Excessive amounts of blood in the stool  Significant tenderness or worsening of abdominal pains  Swelling of the abdomen that is new, acute  Fever of 100F or higher  For urgent or emergent issues, a gastroenterologist can be reached at any hour by calling [phone]. Do not use MyChart messaging for urgent concerns.    DIET:  We do recommend a small meal at first, but then you may proceed to your regular diet.  Drink plenty of fluids but you should avoid alcoholic beverages for 24 hours.  ACTIVITY:  You should plan to take it easy for the rest  of today and you should NOT DRIVE or use heavy machinery until tomorrow (because of the sedation medicines used during the test).    FOLLOW UP: Our staff will call the number listed on your records 48-72 hours following your procedure to check on you and address any questions or concerns that you may have regarding the information given to you following your procedure. If we do not reach you, we will leave a message.  We will attempt to reach you two times.  During this call, we will ask if you have developed any symptoms of COVID 19. If you develop any symptoms (ie: fever, flu-like symptoms, shortness of breath, cough etc.) before then, please call [phone].  If you test positive for Covid 19 in the 2 weeks post procedure, please call and report this information to [place].    If any biopsies were taken you will be contacted by phone or by letter within the next 1-3 weeks.  Please call us at [phone] if you have not heard about the biopsies in 3 weeks.    SIGNATURES/CONFIDENTIALITY: You and/or your care partner have signed paperwork which will be entered into your electronic medical record.  These signatures attest to the fact that that the information above on your After Visit Summary has been reviewed and is understood.  Full responsibility of the confidentiality of this discharge information lies with you and/or your care-partner.

## 2021-06-04 ENCOUNTER — Other Ambulatory Visit: Payer: Self-pay

## 2021-06-04 DIAGNOSIS — K50811 Crohn's disease of both small and large intestine with rectal bleeding: Secondary | ICD-10-CM

## 2021-06-05 ENCOUNTER — Telehealth: Payer: Self-pay | Admitting: *Deleted

## 2021-06-05 NOTE — Telephone Encounter (Signed)
No answer for post procedure call back. Left VM. 

## 2021-06-05 NOTE — Telephone Encounter (Signed)
Left message on f/u call 

## 2021-06-07 ENCOUNTER — Telehealth: Payer: Self-pay | Admitting: Internal Medicine

## 2021-06-07 ENCOUNTER — Other Ambulatory Visit (HOSPITAL_COMMUNITY): Payer: Self-pay | Admitting: Internal Medicine

## 2021-06-07 ENCOUNTER — Other Ambulatory Visit: Payer: Self-pay | Admitting: Internal Medicine

## 2021-06-07 DIAGNOSIS — K50811 Crohn's disease of both small and large intestine with rectal bleeding: Secondary | ICD-10-CM

## 2021-06-07 NOTE — Telephone Encounter (Signed)
Whitney Lowe called from radiology request an e-sign for the Mri order sin Epic.

## 2021-06-07 NOTE — Telephone Encounter (Signed)
I have signed all pending orders

## 2021-06-07 NOTE — Telephone Encounter (Signed)
Please sign orders in epic for MR for pt.

## 2021-06-12 ENCOUNTER — Telehealth: Payer: Self-pay

## 2021-06-12 NOTE — Telephone Encounter (Signed)
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

## 2021-06-12 NOTE — Telephone Encounter (Signed)
Received call from [facility] infusion. Pt received 1st dose of Injectifer 1/25. She was scheduled for 2/1 and was a no show for her second dose. Pt was rescheduled for 2/2 and she did not come, stated she didn't want to be stuck again that week. [facility] not sure if it is to late to schedule the second dose now. Please advise.

## 2021-06-12 NOTE — Telephone Encounter (Signed)
Left message for Whitney Lowe to please go ahead and schedule her second IV infusion.

## 2021-06-13 ENCOUNTER — Encounter: Payer: Self-pay | Admitting: Internal Medicine

## 2021-06-14 ENCOUNTER — Encounter: Payer: Self-pay | Admitting: Internal Medicine

## 2021-06-14 ENCOUNTER — Other Ambulatory Visit: Payer: Self-pay

## 2021-06-14 MED ORDER — LORAZEPAM 1 MG PO TABS: ORAL_TABLET | ORAL | 0 refills | Status: DC

## 2021-06-14 NOTE — Telephone Encounter (Signed)
Lorazepam 1 mg PO 1 hour before MRI She will need a driver to and from this test if she takes the sedation/benzodiazepine

## 2021-06-18 ENCOUNTER — Other Ambulatory Visit (HOSPITAL_COMMUNITY): Payer: Medicaid Other

## 2021-06-18 ENCOUNTER — Other Ambulatory Visit: Payer: Self-pay

## 2021-06-18 ENCOUNTER — Ambulatory Visit (HOSPITAL_COMMUNITY): Payer: Medicaid Other

## 2021-06-18 ENCOUNTER — Ambulatory Visit (HOSPITAL_COMMUNITY)
Admission: RE | Admit: 2021-06-18 | Discharge: 2021-06-18 | Disposition: A | Discharge status: Home / Self Care | Payer: Medicaid Other | Source: Ambulatory Visit | Attending: Internal Medicine | Admitting: Internal Medicine

## 2021-06-18 DIAGNOSIS — N852 Hypertrophy of uterus: Secondary | ICD-10-CM | POA: Diagnosis not present

## 2021-06-18 DIAGNOSIS — K50811 Crohn's disease of both small and large intestine with rectal bleeding: Secondary | ICD-10-CM | POA: Diagnosis not present

## 2021-06-18 DIAGNOSIS — K6389 Other specified diseases of intestine: Secondary | ICD-10-CM | POA: Diagnosis not present

## 2021-06-18 DIAGNOSIS — D252 Subserosal leiomyoma of uterus: Secondary | ICD-10-CM | POA: Diagnosis not present

## 2021-06-18 DIAGNOSIS — N8302 Follicular cyst of left ovary: Secondary | ICD-10-CM | POA: Diagnosis not present

## 2021-06-18 MED ORDER — GADOBUTROL 1 MMOL/ML IV SOLN
5.0000 mL | Freq: Once | INTRAVENOUS | Status: AC | PRN
  Administered 2021-06-18: 5 mL via INTRAVENOUS

## 2021-06-19 DIAGNOSIS — K5080 Crohn's disease of both small and large intestine without complications: Secondary | ICD-10-CM | POA: Diagnosis not present

## 2021-06-21 DIAGNOSIS — K5080 Crohn's disease of both small and large intestine without complications: Secondary | ICD-10-CM | POA: Diagnosis not present

## 2021-06-29 ENCOUNTER — Encounter: Payer: Self-pay | Admitting: Internal Medicine

## 2021-07-01 MED ORDER — HYDROCORTISONE ACETATE 25 MG RE SUPP: 25.0000 mg | Freq: Every evening | RECTAL | 0 refills | Status: AC

## 2021-07-01 NOTE — Telephone Encounter (Signed)
The patient's MR pelvis plus colonoscopy did not show any overt evidence of perianal or rectal Crohn's disease though she certainly has a history of this ?The active portion of her disease most recently was ileal which would not explain these perianal symptoms ? ?I would recommend we add hydrocortisone suppository 25 mg nightly x2 weeks ?She can continue the diltiazem and RectiCare ? ?Please refer her to see Dr. Marcello Moores or Dr. Dema Severin at [facility] to get their opinion regarding this ongoing perianal pain in the setting of known perianal Crohn's disease.  Also will need their opinion regarding her long segment of ileal disease which is not responding to medical therapy. ? ?Continue Entyvio ? ?Thank you ?

## 2021-07-01 NOTE — Telephone Encounter (Signed)
RX for Hydrocortisone suppositories sent to pharmacy on file. ?Urgent referral, patient's records, demographic, and insurance information faxed to [facility] at [phone]. ?Patient notified of recommendations via my chart. ?

## 2021-07-11 DIAGNOSIS — Z131 Encounter for screening for diabetes mellitus: Secondary | ICD-10-CM | POA: Diagnosis not present

## 2021-07-11 DIAGNOSIS — Z1329 Encounter for screening for other suspected endocrine disorder: Secondary | ICD-10-CM | POA: Diagnosis not present

## 2021-07-11 DIAGNOSIS — Z0000 Encounter for general adult medical examination without abnormal findings: Secondary | ICD-10-CM | POA: Diagnosis not present

## 2021-07-11 DIAGNOSIS — R21 Rash and other nonspecific skin eruption: Secondary | ICD-10-CM | POA: Diagnosis not present

## 2021-07-11 DIAGNOSIS — Z8669 Personal history of other diseases of the nervous system and sense organs: Secondary | ICD-10-CM | POA: Diagnosis not present

## 2021-07-11 DIAGNOSIS — G43909 Migraine, unspecified, not intractable, without status migrainosus: Secondary | ICD-10-CM | POA: Diagnosis not present

## 2021-08-02 DIAGNOSIS — K5080 Crohn's disease of both small and large intestine without complications: Secondary | ICD-10-CM | POA: Diagnosis not present

## 2021-08-06 ENCOUNTER — Ambulatory Visit: Payer: Medicaid Other | Admitting: Internal Medicine

## 2021-08-25 ENCOUNTER — Encounter: Payer: Self-pay | Admitting: Internal Medicine

## 2021-08-25 ENCOUNTER — Other Ambulatory Visit: Payer: Self-pay | Admitting: Internal Medicine

## 2021-08-26 DIAGNOSIS — M25561 Pain in right knee: Secondary | ICD-10-CM | POA: Diagnosis not present

## 2021-08-26 DIAGNOSIS — M545 Low back pain, unspecified: Secondary | ICD-10-CM | POA: Diagnosis not present

## 2021-08-26 DIAGNOSIS — M25551 Pain in right hip: Secondary | ICD-10-CM | POA: Diagnosis not present

## 2021-08-26 DIAGNOSIS — M25552 Pain in left hip: Secondary | ICD-10-CM | POA: Diagnosis not present

## 2021-08-26 DIAGNOSIS — M25562 Pain in left knee: Secondary | ICD-10-CM | POA: Diagnosis not present

## 2021-08-26 NOTE — Telephone Encounter (Signed)
Please approve or refuse refill.  ?

## 2021-08-27 ENCOUNTER — Other Ambulatory Visit: Payer: Self-pay | Admitting: Internal Medicine

## 2021-08-27 MED ORDER — HYDROCODONE-ACETAMINOPHEN 5-325 MG PO TABS: 1.0000 | ORAL_TABLET | Freq: Three times a day (TID) | ORAL | 0 refills | Status: DC | PRN

## 2021-08-27 NOTE — Telephone Encounter (Signed)
Dr Hilarie Fredrickson, Are you willing to refill Hydrocodone for this patient  ?

## 2021-08-28 NOTE — Telephone Encounter (Signed)
Hydrocortisone acetate 25 mg suppository is $6.38 according to good Rx at [facility] ?She should get this filled and check for [email or web address] coupon. ?Would plan this nightly x2 to 3 weeks given Crohn's disease of the anal canal/distal rectum ? ?Hopefully this will help her pain ? ?Repeat office visit, she missed the last 1, needs to be rescheduled ?

## 2021-09-05 ENCOUNTER — Ambulatory Visit: Payer: Medicaid Other | Admitting: Nurse Practitioner

## 2021-09-05 ENCOUNTER — Encounter: Payer: Self-pay | Admitting: Nurse Practitioner

## 2021-09-05 VITALS — BP 98/60 | HR 106 | Ht <= 58 in | Wt 102.4 lb

## 2021-09-05 DIAGNOSIS — K50811 Crohn's disease of both small and large intestine with rectal bleeding: Secondary | ICD-10-CM

## 2021-09-05 NOTE — Patient Instructions (Signed)
If you are age 37 or older, your body mass index should be between 23-30. Your Body mass index is 21.4 kg/m. If this is out of the aforementioned range listed, please consider follow up with your Primary Care Provider.  If you are age 72 or younger, your body mass index should be between 19-25. Your Body mass index is 21.4 kg/m. If this is out of the aformentioned range listed, please consider follow up with your Primary Care Provider.   ________________________________________________________  The [facility] providers would like to encourage you to use [facility] to communicate with providers for non-urgent requests or questions.  Due to long hold times on the telephone, sending your provider a message by [facility] may be a faster and more efficient way to get a response.  Please allow 48 business hours for a response.  Please remember that this is for non-urgent requests.  _______________________________________________________  Due to recent changes in healthcare laws, you may see the results of your imaging and laboratory studies on MyChart before your provider has had a chance to review them.  We understand that in some cases there may be results that are confusing or concerning to you. Not all laboratory results come back in the same time frame and the provider may be waiting for multiple results in order to interpret others.  Please give [place] 48 hours in order for your provider to thoroughly review all the results before contacting the office for clarification of your results.   Call in September for a November appointment.  I appreciate the opportunity to care for you. Tye Savoy, NP-C

## 2021-09-05 NOTE — Progress Notes (Signed)
Assessment   Patient Profile:  Whitney Lowe is a 36 y.o. female known to Dr. Hilarie Fredrickson with a past medical history of ileocolonic Crohn's disease diagnosed in 2018, history of erythema nodosum, IDA, C. Difficile, uterine fibroids, bipolar disorder, ADHD.  Additional medical history as listed in [place] .  Ileocolonic Crohn's disease, on Entyvio. No antibodies to Entyvio. She had active small bowel disease on recent colonoscopy with biopsies and MR enterography. Narrowing at ICV, colonoscopy couldn't traverse. MR enterography showing active disease of distal small bowel / TI. Despite endoscopic and radiologic findings she is actually feeling better after making some dietary changes. No longer having rectal pain and the rectal bleeding has improved ( only mild rectal inflammation on colonoscopy). No nausea / vomiting or significant abdominal pain. She wants to hold off on surgical evaluation.   Joint pain.  Despite improvement in GI symptoms she has significant joint pains in knees and hips and is being followed by Ortho. Suspect these pains are related to her IBD, unfortunately Entyvio doesn't help like Humira and Stelara did.    Plan   She wants to hold off on Surgical evaluation. No obstructive symptoms for now.  Will discuss with Dr. Hilarie Fredrickson. Even though in clinical remission right now she still has histologic and radiologic evidence for active disease.  Rectal pain and bleeding improved with dietary changes, increased water and fiber intake.  She is up to date on Valma Cava, last one was Dec 2022 Follow up with Dr. Hilarie Fredrickson in 6 months, sooner if needed   HPI   Chief Complaint : follow up on rectal pain / bleeding.   Whitney Lowe is followed by Dr. Hilarie Fredrickson for ileocolonic Crohn's disease, she is maintained on Entyvio 300 mg every 6 weeks.  She has continued to have signs and symptoms of active disease with pain, rectal bleeding, elevated fecal calprotectin but no antibodies to Entyvio.  She  has been intolerant to immunomodulator therapy. Last seen here 05/22/21.  Colonoscopy was recommended for an objective assessment of her disease.  No active disease in colon. There was inflammation at ICV and narrowing at [facility] wouldn't permit passage of scope.  Biopsies showed active disease at the ileocecal valve/distal small bowel. She was continued on Entyvio and scheduled for small bowel enterography for further assessment of small bowel.   MR enterography showed persistent long segment of bowel wall thickening and mucosal hyperenhancement involving the distal and terminal ileum with several skip lesions not really changed in extent or degree of wall thickening compared to January 2022.  Findings compatible active ileitis.  Her intermittent abdominal symptoms were felt to be from a partial obstruction related to ileal disease.  Plan was to see her back in clinic and discuss surgical consultation  08/06/21 -did not show for follow-up appointment  Interval History:  She called the office 08/25/2021 for rectal pain.   Prescribed hydrocortisone suppositories but insurance wouldn't pay for them.  She used Diltiazem and Recticare in the meantime. Also, in the meantime she stopped drinking sparkling water ( changed to plain water), made several dietary changes and added a stool softener and symptoms have significantly improved. No more rectal pain and bleeding is much better. She wants to hold off on seeing surgeon since feeling better. Having about one BM a day, no rectal pain with last several BMs so hasn't even needed Recticare. She wasn't able to get the hydrocodone prescription we recently sent in for her. Although rectal pain is better she would like  to keep some pain meds on hand, especially for her knee / hip pain.   Stalara and Humira helped with her hip and knee joint pain but Entyvio doesn't help the joint pain. She is seeing Ortho. She was prescribed a steroid pack but she hasn't taken it .     Previous GI Evaluation   06/03/2021 her most recent colonoscopy. Exam was complete, bowel prep was excellent. Simple Endoscopic Score for Crohn's Disease: 3; mucosal inflammatory changes secondary to Crohn's disease at IC valve. Colonic mucosa with scarring but no evidence of active inflammation consistent with Crohn's colitis in remission [name]). Biopsied (Jar 1 ileum, Jar 2 right/transverse colon, Jar 3 left colon). - Mild inflammation was found at the dentate line/anus secondary to mild Crohn's.  Diagnosis 1. Surgical [P], colon, ileocecal valve INFLAMED FRAGMENTS OF LARGE BOWEL MUCOSA WITH NECROTIC TISSUE AND ACUTE INFLAMMATORY EXUDATE. CHRONIC CRYPT DISTORTION AND CRYPT DESTRUCTION IS NOTED. HISTOLOGIC FINDINGS ARE CONSISTENT WITH HISTORY OF ACTIVE INFLAMMATORY BOWEL DISEASE. NO DYSPLASIA IS SEEN. IMMUNOHISTOCHEMISTRY WITH APPROPRIATE CONTROLS FOR PANCYTOKERATIN DOES NOT HIGHLIGHT ANY MALIGNANCY IN THE NECROTIC TISSUE. CLINICAL AND ENDOSCOPIC CORRELATION IS REQUIRED. 2. Surgical [P], right colon FRAGMENTS OF LARGE BOWEL MUCOSA SHOWING NO SIGNIFICANT INFLAMMATION OR CHRONIC CRYPT ARCHITECTURAL CHANGES. NO DYSPLASIA OR MALIGNANCY IS SEEN. 3. Surgical [P], left colon FRAGMENTS OF LARGE BOWEL MUCOSA SHOWING MILD NONSPECIFIC INFLAMMATION WITH FOCAL CRYPT BRANCHING. NO DYSPLASIA OR MALIGNANCY IS SEEN.   Imaging   06/18/2021 MR enterography IMPRESSION: 1. Persistent long segment of bowel wall thickening and mucosal hyperenhancement involving the distal and terminal ileum with several skip lesions, not substantially changed in extent or degree of wall thickening since 04/21/2020 CT abdomen/pelvis. Findings arecompatible with active Crohn's ileitis. No new sites of active inflammatory bowel disease. 2. No evidence of bowel obstruction, fistula or abscess. 3. Mildly enlarged myomatous uterus  Labs:     Latest Ref Rng & Units 04/19/2021   11:56 AM 07/12/2020    6:50 PM 04/20/2020     7:58 PM  CBC  WBC 4.0 - 10.5 K/uL 6.6   9.7   6.8    Hemoglobin 12.0 - 15.0 g/dL 13.2   13.9   12.8    Hematocrit 36.0 - 46.0 % 40.2   41.3   40.1    Platelets 150.0 - 400.0 K/uL 443.0   447   386         Latest Ref Rng & Units 04/19/2021   11:56 AM 04/20/2020    7:58 PM 01/16/2020    2:37 PM  Hepatic Function  Total Protein 6.0 - 8.3 g/dL 7.6   7.3   7.5    Albumin 3.5 - 5.2 g/dL 3.8   3.3   3.9    AST 0 - 37 U/L _0 ALT 0 - 35 U/L _1 Alk Phosphatase 39 - 117 U/L 55   54   55    Total Bilirubin 0.2 - 1.2 mg/dL 0.3   0.3   0.2       Past Medical History:  Diagnosis Date   ADHD    stopped abilify with preg   ADHD (attention deficit hyperactivity disorder)    Anemia    Anxiety    not currently on meds   Bipolar    Cataract    Rt eye   Complication of anesthesia    became combative    Crohn's ileocolitis (  [place]) 06/19/2016   Eczema    Fibroids    uterine   Infection    UTI   Ovarian cyst    Periumbilical hernia     Past Surgical History:  Procedure Laterality Date   CESAREAN SECTION N/A 01/01/2019   Procedure: CESAREAN SECTION;  Surgeon: Sloan Leiter, MD;  Location: [place] [facility];  Service: Obstetrics;  Laterality: N/A;   COLONOSCOPY     ESOPHAGOGASTRODUODENOSCOPY     removal of cyst in ear     6th grade    Current Medications, Allergies, Family History and Social History were reviewed in [employer] record.     Current Outpatient Medications  Medication Sig Dispense Refill   diltiazem 2 % GEL Apply 1 application topically 2 (two) times daily. 30 g 1   ENTYVIO 300 MG injection Inject into the vein.     HYDROcodone-acetaminophen (NORCO/VICODIN) 5-325 MG tablet Take 1 tablet by mouth every 8 (eight) hours as needed for severe pain. 40 tablet 0   LORazepam (ATIVAN) 1 MG tablet Take tablet by mouth 1 hour before exam. You must have a driver to the MRI due to sedation 1 tablet 0   PEG-KCl-NaCl-NaSulf-Na Asc-C (PLENVU)  140 g SOLR Take 1 kit by mouth as directed. Use coupon: BIN: [id number] PNC: CNRX Group: [id number] ID: [id number] 1 each 0   No current facility-administered medications for this visit.    Review of Systems: No chest pain. No shortness of breath. No urinary complaints.    Physical Exam  Wt Readings from Last 3 Encounters:  06/03/21 106 lb (48.1 kg)  05/22/21 106 lb 6.4 oz (48.3 kg)  04/19/21 112 lb 6.4 oz (51 kg)    BP 98/60   Pulse (!) 106   Ht 4' 10" (1.473 m) Comment: height measured without shoes  Wt 102 lb 6 oz (46.4 kg)   LMP 08/17/2021   BMI 21.40 kg/m  Constitutional:  Generally well appearing female in no acute distress. Psychiatric: Pleasant. Normal mood and affect. Behavior is normal. EENT: Pupils normal.  Conjunctivae are normal. No scleral icterus. Neck supple.  Cardiovascular: Normal rate, regular rhythm. No edema Pulmonary/chest: Effort normal and breath sounds normal. No wheezing, rales or rhonchi. Abdominal: Soft, nondistended, nontender. Bowel sounds active throughout. There are no masses palpable. No hepatomegaly. Neurological: Alert and oriented to person place and time. Skin: Skin is warm and dry. No rashes noted.  I spent 30 minutes total reviewing records, obtaining history, performing exam, counseling patient and documenting visit / findings.   Tye Savoy, NP  09/05/2021, 8:50 AM

## 2021-09-11 NOTE — Telephone Encounter (Signed)
Can patient have refills?

## 2021-09-12 MED ORDER — HYDROCODONE-ACETAMINOPHEN 5-325 MG PO TABS: 1.0000 | ORAL_TABLET | Freq: Three times a day (TID) | ORAL | 0 refills | Status: DC | PRN

## 2021-09-13 ENCOUNTER — Telehealth: Payer: Self-pay | Admitting: Pharmacy Technician

## 2021-09-13 ENCOUNTER — Encounter: Payer: Self-pay | Admitting: Internal Medicine

## 2021-09-13 ENCOUNTER — Other Ambulatory Visit (HOSPITAL_COMMUNITY): Payer: Self-pay

## 2021-09-13 DIAGNOSIS — K5080 Crohn's disease of both small and large intestine without complications: Secondary | ICD-10-CM | POA: Diagnosis not present

## 2021-09-13 NOTE — Telephone Encounter (Signed)
Patient Advocate Encounter  Received notification from [place] that prior authorization for [facility]) is required.   PA submitted on 5.26.23 Key [id number] Status is pending   [facility] will continue to follow  Luciano Cutter, CPhT Patient Advocate Phone: [phone]

## 2021-09-17 ENCOUNTER — Other Ambulatory Visit (HOSPITAL_COMMUNITY): Payer: Self-pay

## 2021-09-17 NOTE — Telephone Encounter (Signed)
Received notification from [place] regarding a prior authorization for HYDROCODONE 5-325MG. Authorization has been APPROVED from 5.26.23 to 11.22.23.     Authorization # PA Case: [id number]

## 2021-10-25 DIAGNOSIS — K5080 Crohn's disease of both small and large intestine without complications: Secondary | ICD-10-CM | POA: Diagnosis not present

## 2021-11-11 ENCOUNTER — Other Ambulatory Visit: Payer: Self-pay

## 2021-11-11 ENCOUNTER — Encounter: Payer: Self-pay | Admitting: Internal Medicine

## 2021-11-11 MED ORDER — PREDNISONE 10 MG PO TABS: ORAL_TABLET | ORAL | 0 refills | Status: AC

## 2021-11-15 ENCOUNTER — Encounter: Payer: Self-pay | Admitting: Internal Medicine

## 2021-12-11 DIAGNOSIS — K5080 Crohn's disease of both small and large intestine without complications: Secondary | ICD-10-CM | POA: Diagnosis not present

## 2021-12-30 ENCOUNTER — Encounter: Payer: Self-pay | Admitting: Internal Medicine

## 2022-01-22 ENCOUNTER — Telehealth: Payer: Self-pay | Admitting: Nurse Practitioner

## 2022-01-22 DIAGNOSIS — K5080 Crohn's disease of both small and large intestine without complications: Secondary | ICD-10-CM | POA: Diagnosis not present

## 2022-02-11 ENCOUNTER — Encounter: Payer: Self-pay | Admitting: Internal Medicine

## 2022-02-12 ENCOUNTER — Telehealth: Payer: Self-pay | Admitting: Nurse Practitioner

## 2022-02-12 NOTE — Telephone Encounter (Signed)
Called pt and pt stated she was in the middle of an online training and would call back.

## 2022-02-12 NOTE — Telephone Encounter (Signed)
Patient is returning your call.  

## 2022-02-12 NOTE — Telephone Encounter (Addendum)
Spoke with pt. Pt states that she is having rectal pain and bleeding with bowel movements and wanted to know if she could take hydrocodone that Dr. Hilarie Fredrickson previously prescribed for her rectal pain. Pt also said she is afraid of being constipated with the rectal pain she is having and didn't want to make her situation worse. Pt wanted to know if she could take colace with hydrocodone for rectal pain. Pt states that she can't use recticare due to urgency. Pt states there's not enough time to use it before she has a bowel movement. Pt is using tylenol and ice packs but they do not seem to be helping. Pt reports last entyvio infusion was around October 6th. Pt scheduled for f/u appt with Vicie Mutters PA on 02/13/22 at 2:30 pm. Pt verbalized understanding.

## 2022-02-12 NOTE — Telephone Encounter (Signed)
Inbound call from patient stating that she is having a lot of pain in her rectum and is wanting to take pain pill and also wanting to know if she can take colace after taking the pain pill. And if she can she is wanting to know how often she can do the colace. 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  Please advise.

## 2022-02-13 ENCOUNTER — Encounter: Payer: Self-pay | Admitting: Physician Assistant

## 2022-02-13 ENCOUNTER — Ambulatory Visit: Payer: Medicaid Other | Admitting: Physician Assistant

## 2022-02-13 VITALS — BP 90/60 | HR 94 | Ht <= 58 in | Wt 95.6 lb

## 2022-02-13 DIAGNOSIS — E538 Deficiency of other specified B group vitamins: Secondary | ICD-10-CM | POA: Diagnosis not present

## 2022-02-13 DIAGNOSIS — Z13220 Encounter for screening for lipoid disorders: Secondary | ICD-10-CM

## 2022-02-13 DIAGNOSIS — E559 Vitamin D deficiency, unspecified: Secondary | ICD-10-CM | POA: Diagnosis not present

## 2022-02-13 DIAGNOSIS — D500 Iron deficiency anemia secondary to blood loss (chronic): Secondary | ICD-10-CM

## 2022-02-13 DIAGNOSIS — K6289 Other specified diseases of anus and rectum: Secondary | ICD-10-CM | POA: Diagnosis not present

## 2022-02-13 DIAGNOSIS — K50811 Crohn's disease of both small and large intestine with rectal bleeding: Secondary | ICD-10-CM | POA: Diagnosis not present

## 2022-02-13 MED ORDER — NON FORMULARY: 1 refills | Status: DC

## 2022-02-13 NOTE — Progress Notes (Addendum)
02/13/2022 Whitney Lowe [id number] 15-Feb-1986  Referring provider: No ref. provider found Primary GI doctor: Dr. Hilarie Fredrickson  ASSESSMENT AND PLAN:   Crohn's disease of both small and large intestine with rectal bleeding and rectal pain 05/2021 colonoscopy could not transverse IVC, still with active colitis in small bowel on path with MRE showing small bowel inflammation Rectal pain and bleeding x 1 year Fecal cal elevated 04/25/2021 823 Has failed humira due to antibody levels, stelera stopped working, some response to entivyio with her colon but still evidence of small bowel inflammation and possible rectal inflammation, arthritis not as well controlled.   Will repeat inflammatory labs for objective information for disease process Entivyio increased to q 6 weeks since Feb due to low level, no antibody Will repeat entyvio antibody and level before next infusion in 2 weeks.  Patient also states due to her ADHD she has not competed a steroid taper, she take 3-7 days and then stops, does not take full dose due to side effects.  Will check lipid panel and encouraged patient to get shingirx vaccine inc ase we want to try to switch to rinvoq, skyrizi may also be acceptable, she is up to date on TB screening Will discuss with Dr. Hilarie Fredrickson and get any further recommendations from him Will schedule with his first available.   Iron deficiency anemia due to chronic blood loss Had iron def last year, continues to have rectal bleeding Check iron, consider repeat iron infusion  Rectal pain Unable to tolerate rectal exam, will refer to general surgery for EUA/ultrasound, possible non healing rectal fissure with history but not seen on colonoscopy 05/2021 Discussed with patient needs fleck sigmoidoscopy prior to general surgery evaluation. Sitz baths, colace, miralax, fiber, keep stools softer Diltiazem2%/lidocaine 5% 3 x daily for 2 months on rectum #30 gram 1 refill Versus worsening crohn's, will  get inflammatory labs  Vitamin D deficiency -     Vitamin D 1,25 dihydroxy; Future  B12 deficiency -     Vitamin B12; Future   Patient Care Team: Armanda Heritage, NP as Nurse Practitioner (Nurse Practitioner)  HISTORY OF PRESENT ILLNESS: 36 y.o. female with history of bipolar depression, uterine fibroids, ADHD presents for evaluation of rectal pain in setting of ileocolonic Crohn's disease Last seen in the office on 09/05/2021.   IBD history (prior medications titers why stopped and current meds with last dose): Diagnosed 2018  Was on Humira from 07/08/2017 to 07/09/2018 40 mg every 2 weeks but developed antibodies Patient was on azathioprine 50 mg once daily 09/12/2019 for several days, did not tolerate due to right flank pain/pancreatic pain Ustekinumab/Stelara from 02/2020 to 04/2020 and stopped working so was switched 04/19/2021 Entyvio drug level low normal, no antibodies. Given prednisone 40 mg from 7 days decreasing 5 mg every 7 days until off. Vedolizumab/ Entyvio started 07/04/2020, Pt reports last infusion was around October 6th and goes to [facility] Dose was increased to 300 mg every 6 weeks in Feb.  Had subsequent colonoscopy 06/03/2021 unable to transverse ICV, pathology showed ileocecal valve acute inflammatory exudate chronic crypt distortion consistent with active bowel disease right and left colon unremarkable. Had MRE for rectal bleeding shows persistent long segment bowel wall thickening involving distal and terminal ileum, no bowel obstruction fistula or abscess. 09/05/2021 given prednisone taper 11/11/2021 given prednisone 10 mg taper 100 tablets total  Last colonoscopy: 06/03/2021, with normal digital rectal exam, score for Crohn's disease 3, mucosal lymphoid changes secondary Crohn's disease at IC valve  colonoscopy good and transverse, scarring but no evidence of active inflammation, ileum mild inflammation at the dentate line/anus secondary to mild Crohn's-  medication patient was on Entyvio. Pathology: Ileocecal valve biopsy inflamed with acute inflammatory exudate, chronic crypt distortion consistent with active inflammatory bowel disease.  No dysplasia is seen.  No malignancy.  Right colon showed no significant inflammation, left colon shows mild nonspecific inflammation with focal crypt branching no malignancy. Last small bowel imaging: 06/18/2021 MRE for rectal bleeding showed persistent long segment of bowel wall thickening and mucosal hyperenhancement involving the distal and terminal ileum several skip lesions not substantially changed from wall thickening since 04/21/2020, active Crohn's ileitis.  No evidence of bowel obstruction, fistula or abscess.  Mildly enlarged uterus. Extraintestinal manifestations: Arthralgias Surgical history: no surgery.  Other significant medical history: Patient has history of erythema nodosum History of C. Difficile Iron deficiency anemia iron 40  IV Iron last year Arthralgias, follows with orthopedics.  Humira and Stelara helped arthritis pains, Entyvio is not helping.  Current History She states she was doing very well until Oct of last year.  She states she has severe rectal pain. Recticare is temporary help.  She feels the burning before she has BM, severe pain, tylenol does not help, hydrocodone helps but causes constipation.  She states she started magnesium 2 months ago and that helped her pain in her rectum, was on 438m daily. She only has 1 BM a day first thing in the morning as soon as she opens her eyes, can be 3 AM or 6 AM.  If she has severe rectal pain she will not eat dinner the night so she can try to avoid BM the next morning. She will still have the urge to have BM.  Normally long soft stools, 4 bristol stool, but an have 2 bristol stools.  She has some mucus but has BRB every BM, can drip.  No AB pain, no fever, chills.   The patient has weight loss, has lost 17 lbs. tries to drink Ensure  1-2 times daily. She states she has ADHD and has a hard time tapering off the prednisone, she is normally on it doing her own doses for example, she took 20 mg the last 3 days. She has never completed a taper.  Due for another infusion in 2 weeks.  Denies rashes, has had oral ulcers.  States magnesium may have helped some of her joint pain as well primarily her ankles knees and elbows.  Wt Readings from Last 5 Encounters:  02/13/22 95 lb 9.6 oz (43.4 kg)  09/05/21 102 lb 6 oz (46.4 kg)  06/03/21 106 lb (48.1 kg)  05/22/21 106 lb 6.4 oz (48.3 kg)  04/19/21 112 lb 6.4 oz (51 kg)   Recent labs: 01/16/2020 CRP <1.0  Fecal cal 04/25/2021 823 04/19/2021 WBC 6.6 HGB 13.2 MCV 87.3 Platelets 443.0 04/19/2021 Iron 40 Ferritin 8.8  B12 527 04/19/2021 AST 14 ALT 8 Alkphos 55 TBili 0.3 VITAMIN D suggest getting at some point TB GOLD 04/19/2021 NEGATIVE or TB skin if indeterminate.  HepBsAG 05/18/2020 NON-REACTIVE   Immunization History  Administered Date(s) Administered   Influenza-Unspecified 05/08/2020      She  reports that she quit smoking about 3 years ago. Her smoking use included cigars and cigarettes. She has never used smokeless tobacco. She reports current alcohol use. She reports that she does not currently use drugs after having used the following drugs: Marijuana.  Current Medications:   Current Outpatient Medications (Endocrine &  Metabolic):    predniSONE (DELTASONE) 10 MG tablet, Take 10 mg by mouth as needed.    Current Outpatient Medications (Analgesics):    HYDROcodone-acetaminophen (NORCO/VICODIN) 5-325 MG tablet, Take 1 tablet by mouth every 8 (eight) hours as needed for severe pain.   HYDROcodone-acetaminophen (NORCO/VICODIN) 5-325 MG tablet, Take 1 tablet by mouth every 8 (eight) hours as needed for severe pain.   Current Outpatient Medications (Other):    ENTYVIO 300 MG injection, Inject into the vein.   LORazepam (ATIVAN) 1 MG tablet, Take tablet by mouth 1 hour  before exam. You must have a driver to the MRI due to sedation   Magnesium 250 MG TABS, Take by mouth.   NON FORMULARY, Diltiazem 2%/Lidocaine5% compound Use 3 x rectally daily for 2 months to heal anal fissure  Medical History:  Past Medical History:  Diagnosis Date   ADHD    stopped abilify with preg   ADHD (attention deficit hyperactivity disorder)    Anemia    Anxiety    not currently on meds   Bipolar    Cataract    Rt eye   Complication of anesthesia    became combative    Crohn's ileocolitis ([place]) 06/19/2016   Eczema    Fibroids    uterine   Infection    UTI   Ovarian cyst    Periumbilical hernia    Allergies:  Allergies  Allergen Reactions   Benadryl [Diphenhydramine]     Pt reported new onset tightness at her throat shortly after receiving 25 mg Iv Benadryl. She also was noticeably less oriented than previous     Surgical History:  She  has a past surgical history that includes Colonoscopy; Esophagogastroduodenoscopy; removal of cyst in ear; and Cesarean section (N/A, 01/01/2019). Family History:  Her family history includes ADD / ADHD in her father; Anxiety disorder in her father and mother; Asthma in her father; Blindness in her mother; COPD in her father; Crohn's disease in her mother; Depression in her father; Diabetes in her father and sister; Diabetes Mellitus II in her father; Heart disease in her mother; Hypertension in her father; Liver cancer in her father.  REVIEW OF SYSTEMS  : All other systems reviewed and negative except where noted in the History of Present Illness.  PHYSICAL EXAM: BP 90/60 (BP Location: Left Arm, Patient Position: Sitting, Cuff Size: Normal)   Pulse 94   Ht 4' 10"  (1.473 m)   Wt 95 lb 9.6 oz (43.4 kg)   SpO2 99%   BMI 19.98 kg/m  General:   Pleasant, thin appearing female in no acute distress Head:   Normocephalic and atraumatic. Eyes:  sclerae anicteric,conjunctive pink, mouth with possible oral ulcer Heart:   regular rate  and rhythm Pulm:  Clear anteriorly; no wheezing Abdomen:   Soft, Flat AB, Active bowel sounds. No distension. mild tenderness in the lower abdomen. Without guarding and Without rebound, No organomegaly appreciated. Rectal: Normal external rectum, no evidence of fissure, no obvious induration, erythema.  No peritoneal tenderness.  Patient unable to tolerate rectal exam, no obvious fissure. Extremities:  Without edema. Msk: Symmetrical without gross deformities. Peripheral pulses intact.  Neurologic:  Alert and  oriented x4;  No focal deficits.  Skin:   Dry and intact without significant lesions or rashes. Psychiatric:  Cooperative. Normal mood and affect.    Vladimir Crofts, PA-C 3:39 PM

## 2022-02-13 NOTE — Patient Instructions (Addendum)
Your provider has requested that you go to the basement level for lab work before leaving today. Press "B" on the elevator. The lab is located at the first door on the left as you exit the elevator.    Anal Fissure, Adult  Diltiazem/lidocaine 3 x daily for 2 months sent to compound pharmacy  NTG 1.25% use gloved hand, apply pea size amount every 6-8 hours for pain rectally, #30 gram no refills.    Sent this medication to a compound pharmacy:  [facility] [address] [place], [place] [address]  [phone]  Please DO NOT go directly from our office to pick up this medication! Give the pharmacy 1 day to process the prescription. Extra time is required for them to compound your medication.  Follow up should symptoms persist for secondary evaluation and possible surgical referral for repair.  GET SHINGLES VACCINE FROM [facility]  Continue colace and magnesium Can add on miralax if you are taking hydrocodone  Miralax is an osmotic laxative.  It only brings more water into the stool.  This is safe to take daily.  Can take up to 17 gram of miralax twice a day.  Mix with juice or coffee.  Start 1 capful at night for 3-4 days and reassess your response in 3-4 days.  You can increase and decrease the dose based on your response.  Remember, it can take up to 3-4 days to take effect OR for the effects to wear off.   I often pair this with benefiber in the morning to help assure the stool is not too loose.

## 2022-02-18 ENCOUNTER — Telehealth: Payer: Self-pay

## 2022-02-18 DIAGNOSIS — D500 Iron deficiency anemia secondary to blood loss (chronic): Secondary | ICD-10-CM

## 2022-02-18 DIAGNOSIS — E559 Vitamin D deficiency, unspecified: Secondary | ICD-10-CM

## 2022-02-18 DIAGNOSIS — K6289 Other specified diseases of anus and rectum: Secondary | ICD-10-CM

## 2022-02-18 DIAGNOSIS — K50811 Crohn's disease of both small and large intestine with rectal bleeding: Secondary | ICD-10-CM

## 2022-02-18 DIAGNOSIS — E538 Deficiency of other specified B group vitamins: Secondary | ICD-10-CM

## 2022-02-18 NOTE — Telephone Encounter (Signed)
Patient said she will get her lab work done 11-13 or 11-14. I told her 11-13 would be great because she has stool studies and that if she gets her lab work done after her infusion she will mess up her lab work. I stressed that to her and she said she will get it done on the 13. Entyvio added to labs.

## 2022-02-18 NOTE — Progress Notes (Signed)
Addendum: Reviewed and agree with assessment and management plan. Agree that [name] is working well for colonic disease but not small bowel and perhaps perirectal/perianal disease. Agree with surgical input Also agree that we may need to swap Entyvio out for a different biologic such as Orson Ape which would likely have better small bowel disease control than Entyvio.  That said I feel that ileocecectomy and resection of her refractory small bowel disease would likely lead to improved long-term management and medical control. I have she has previously seen Demetrio Lapping or my intention was to have her referred to see Demetrio Lapping.  This would likely be the best person to refer her to for her perianal disease as well. Agree with the objective disease markers like blood work and fecal calprotectin  Sonam Wandel, Lajuan Lines, MD

## 2022-02-19 ENCOUNTER — Telehealth: Payer: Self-pay

## 2022-02-19 NOTE — Telephone Encounter (Signed)
Left message for patient to call back. Referral faxed to [facility] to [facility] per Dr. Hilarie Fredrickson.

## 2022-02-19 NOTE — Telephone Encounter (Signed)
Vladimir Crofts, PA-C  , Salvadore Dom, RN  Can we make sure surgical referral is to the physician Dr. Hilarie Fredrickson suggested?  Thanks!

## 2022-02-20 NOTE — Telephone Encounter (Signed)
Patient made aware of new referral that has been sent in to [facility]. [facility] referral cancelled. Pt advised to call back if she has not heard from their office within 2 weeks. Pt verbalized all understanding.

## 2022-02-27 ENCOUNTER — Ambulatory Visit: Payer: Medicaid Other | Admitting: Nurse Practitioner

## 2022-03-04 ENCOUNTER — Other Ambulatory Visit (INDEPENDENT_AMBULATORY_CARE_PROVIDER_SITE_OTHER): Payer: Medicaid Other

## 2022-03-04 DIAGNOSIS — E559 Vitamin D deficiency, unspecified: Secondary | ICD-10-CM | POA: Diagnosis not present

## 2022-03-04 DIAGNOSIS — E538 Deficiency of other specified B group vitamins: Secondary | ICD-10-CM

## 2022-03-04 DIAGNOSIS — Z13220 Encounter for screening for lipoid disorders: Secondary | ICD-10-CM | POA: Diagnosis not present

## 2022-03-04 DIAGNOSIS — K50811 Crohn's disease of both small and large intestine with rectal bleeding: Secondary | ICD-10-CM | POA: Diagnosis not present

## 2022-03-04 DIAGNOSIS — K6289 Other specified diseases of anus and rectum: Secondary | ICD-10-CM

## 2022-03-04 DIAGNOSIS — D500 Iron deficiency anemia secondary to blood loss (chronic): Secondary | ICD-10-CM | POA: Diagnosis not present

## 2022-03-04 LAB — LIPID PANEL
Cholesterol: 177 mg/dL (ref 0–200)
HDL: 45.2 mg/dL
LDL Cholesterol: 115 mg/dL — ABNORMAL HIGH (ref 0–99)
NonHDL: 131.77
Total CHOL/HDL Ratio: 4
Triglycerides: 84 mg/dL (ref 0.0–149.0)
VLDL: 16.8 mg/dL (ref 0.0–40.0)

## 2022-03-04 LAB — HEPATIC FUNCTION PANEL
ALT: 12 U/L (ref 0–35)
AST: 13 U/L (ref 0–37)
Albumin: 4 g/dL (ref 3.5–5.2)
Alkaline Phosphatase: 71 U/L (ref 39–117)
Bilirubin, Direct: 0.1 mg/dL (ref 0.0–0.3)
Total Bilirubin: 0.3 mg/dL (ref 0.2–1.2)
Total Protein: 7.6 g/dL (ref 6.0–8.3)

## 2022-03-04 LAB — IBC + FERRITIN
Ferritin: 186.9 ng/mL (ref 10.0–291.0)
Iron: 61 ug/dL (ref 42–145)
Saturation Ratios: 21.2 % (ref 20.0–50.0)
TIBC: 288.4 ug/dL (ref 250.0–450.0)
Transferrin: 206 mg/dL — ABNORMAL LOW (ref 212.0–360.0)

## 2022-03-04 LAB — CBC WITH DIFFERENTIAL/PLATELET
Basophils Absolute: 0.1 10*3/uL (ref 0.0–0.1)
Basophils Relative: 0.6 % (ref 0.0–3.0)
Eosinophils Absolute: 0 10*3/uL (ref 0.0–0.7)
Eosinophils Relative: 0.2 % (ref 0.0–5.0)
HCT: 40 % (ref 36.0–46.0)
Hemoglobin: 13.3 g/dL (ref 12.0–15.0)
Lymphocytes Relative: 11 % — ABNORMAL LOW (ref 12.0–46.0)
Lymphs Abs: 1.1 10*3/uL (ref 0.7–4.0)
MCHC: 33.3 g/dL (ref 30.0–36.0)
MCV: 89.1 fl (ref 78.0–100.0)
Monocytes Absolute: 0.1 10*3/uL (ref 0.1–1.0)
Monocytes Relative: 0.6 % — ABNORMAL LOW (ref 3.0–12.0)
Neutro Abs: 8.5 10*3/uL — ABNORMAL HIGH (ref 1.4–7.7)
Neutrophils Relative %: 87.6 % — ABNORMAL HIGH (ref 43.0–77.0)
Platelets: 481 10*3/uL — ABNORMAL HIGH (ref 150.0–400.0)
RBC: 4.49 Mil/uL (ref 3.87–5.11)
RDW: 14.1 % (ref 11.5–15.5)
WBC: 9.7 10*3/uL (ref 4.0–10.5)

## 2022-03-04 LAB — BASIC METABOLIC PANEL
BUN: 11 mg/dL (ref 6–23)
CO2: 28 mEq/L (ref 19–32)
Calcium: 9.2 mg/dL (ref 8.4–10.5)
Chloride: 101 mEq/L (ref 96–112)
Creatinine, Ser: 0.79 mg/dL (ref 0.40–1.20)
GFR: 95.28 mL/min (ref >60.00)
Glucose, Bld: 123 mg/dL — ABNORMAL HIGH (ref 70–99)
Potassium: 3.9 mEq/L (ref 3.5–5.1)
Sodium: 138 mEq/L (ref 135–145)

## 2022-03-04 LAB — FOLATE: Folate: 15.2 ng/mL (ref >5.9)

## 2022-03-04 LAB — SEDIMENTATION RATE: Sed Rate: 55 mm/hr — ABNORMAL HIGH (ref 0–20)

## 2022-03-04 LAB — HIGH SENSITIVITY CRP: CRP, High Sensitivity: 21.83 mg/L — ABNORMAL HIGH (ref 0.000–5.000)

## 2022-03-04 LAB — MAGNESIUM: Magnesium: 1.9 mg/dL (ref 1.5–2.5)

## 2022-03-04 LAB — VITAMIN B12: Vitamin B-12: 533 pg/mL (ref 211–911)

## 2022-03-05 ENCOUNTER — Other Ambulatory Visit: Payer: Medicaid Other

## 2022-03-05 DIAGNOSIS — D500 Iron deficiency anemia secondary to blood loss (chronic): Secondary | ICD-10-CM | POA: Diagnosis not present

## 2022-03-05 DIAGNOSIS — K5080 Crohn's disease of both small and large intestine without complications: Secondary | ICD-10-CM | POA: Diagnosis not present

## 2022-03-09 LAB — VITAMIN D 1,25 DIHYDROXY
Vitamin D 1, 25 (OH)2 Total: 87 pg/mL — ABNORMAL HIGH (ref 18–72)
Vitamin D2 1, 25 (OH)2: <8 pg/mL
Vitamin D3 1, 25 (OH)2: 87 pg/mL

## 2022-03-10 LAB — SERIAL MONITORING

## 2022-03-11 LAB — VEDOLIZUMAB AND ANTI-VEDO AB
Anti-Vedolizumab Antibody: <25 ng/mL
Vedolizumab: 6.9 ug/mL

## 2022-03-11 LAB — CALPROTECTIN: Calprotectin: 6970 mcg/g — ABNORMAL HIGH

## 2022-03-17 MED ORDER — SKYRIZI 600 MG/10ML IV SOLN: INTRAVENOUS | 0 refills | Status: AC

## 2022-03-28 ENCOUNTER — Telehealth: Payer: Self-pay | Admitting: Physician Assistant

## 2022-03-28 NOTE — Telephone Encounter (Signed)
Inbound call from [facility] looking for information on this patients Skyrizi Rx. Please advise

## 2022-04-01 DIAGNOSIS — K50119 Crohn's disease of large intestine with unspecified complications: Secondary | ICD-10-CM | POA: Diagnosis not present

## 2022-04-01 NOTE — Telephone Encounter (Signed)
Spoke with [place] advised that order could be cancelled for now. Patient will have to have orders faxed to [facility] to begin the infusions first, before ordering SQ injections. Will call to follow up with [facility] to ensure orders were received.

## 2022-04-02 NOTE — Telephone Encounter (Signed)
Confirmed with [facility] that new orders were received & prior authorization is currently in process.

## 2022-04-03 ENCOUNTER — Other Ambulatory Visit: Payer: Self-pay

## 2022-04-03 ENCOUNTER — Telehealth: Payer: Self-pay

## 2022-04-03 DIAGNOSIS — K50811 Crohn's disease of both small and large intestine with rectal bleeding: Secondary | ICD-10-CM

## 2022-04-03 NOTE — Telephone Encounter (Signed)
Called patient & requested that she come in for an updated TB test required for her infusions at [facility]. She does not feel comfortable proceeding with Skyrizi infusions until she can discuss with provider in further detail about the medication. Will make [place] aware.

## 2022-04-03 NOTE — Telephone Encounter (Signed)
Received call from [facility] & they are requesting an updated TB result in order to proceed with new infusion orders. Last one was completed in 03/2021. Order placed.

## 2022-04-03 NOTE — Telephone Encounter (Signed)
Patient spoke with PA & would like to proceed with infusion. She's been advised to come in for labs to get an updated TB test, and then [facility] will process the orders and contact her. Advised her on when/where to go for labs. Pt verbalized all understanding.

## 2022-04-12 ENCOUNTER — Telehealth: Payer: Medicaid Other | Admitting: Urgent Care

## 2022-04-12 DIAGNOSIS — J069 Acute upper respiratory infection, unspecified: Secondary | ICD-10-CM | POA: Diagnosis not present

## 2022-04-12 DIAGNOSIS — H1033 Unspecified acute conjunctivitis, bilateral: Secondary | ICD-10-CM | POA: Diagnosis not present

## 2022-04-12 MED ORDER — IPRATROPIUM BROMIDE 0.03 % NA SOLN: 2.0000 | Freq: Two times a day (BID) | NASAL | 0 refills | Status: DC

## 2022-04-12 MED ORDER — MOXIFLOXACIN HCL 0.5 % OP SOLN: 1.0000 [drp] | Freq: Three times a day (TID) | OPHTHALMIC | 0 refills | Status: AC

## 2022-04-12 NOTE — Progress Notes (Signed)
Virtual Visit Consent   Whitney Lowe, you are scheduled for a virtual visit with a [facility] provider today. Just as with appointments in the office, your consent must be obtained to participate. Your consent will be active for this visit and any virtual visit you may have with one of our providers in the next 365 days. If you have a MyChart account, a copy of this consent can be sent to you electronically.  As this is a virtual visit, video technology does not allow for your provider to perform a traditional examination. This may limit your provider's ability to fully assess your condition. If your provider identifies any concerns that need to be evaluated in person or the need to arrange testing (such as labs, EKG, etc.), we will make arrangements to do so. Although advances in technology are sophisticated, we cannot ensure that it will always work on either your end or our end. If the connection with a video visit is poor, the visit may have to be switched to a telephone visit. With either a video or telephone visit, we are not always able to ensure that we have a secure connection.  By engaging in this virtual visit, you consent to the provision of healthcare and authorize for your insurance to be billed (if applicable) for the services provided during this visit. Depending on your insurance coverage, you may receive a charge related to this service.  I need to obtain your verbal consent now. Are you willing to proceed with your visit today? Whitney Lowe has provided verbal consent on 04/12/2022 for a virtual visit (video or telephone). Whitney Malling, PA  Date: 04/12/2022 7:31 PM  Virtual Visit via Video Note   I, [place], connected with  Whitney Lowe  ([id number], 1985/07/22) on 04/12/22 at  6:30 PM EST by a video-enabled telemedicine application and verified that I am speaking with the correct person using two identifiers.  Location: Patient: Virtual Visit Location Patient:  Home Provider: Virtual Visit Location Provider: Home Office   I discussed the limitations of evaluation and management by telemedicine and the availability of in person appointments. The patient expressed understanding and agreed to proceed.    History of Present Illness: Whitney Lowe is a 36 y.o. who identifies as a female who was assigned female at birth, and is being seen today for congestion and red eyes.  HPI: 36yo female presents today with URI sx. States her three year old just started daycare and has been sick since. On Thursday pt started having nasal congestion, sneezing and rhinorrhea. Has been using breath right strips on her nose. Also on Thursday noticed a bump on her L medial eye. Thought it was a stye and bought OTC stye treatment. Upon wakening this morning, has bilateral red eyes with thick mucopurulent discharge. Denies blurred vision or headache. Does not wear contact lenses. Denies fever or photophobia.   Problems:  Patient Active Problem List   Diagnosis Date Noted   IDA (iron deficiency anemia) 04/22/2021   Placental abruption 01/01/2019   Tachycardia 12/01/2018   Preterm premature rupture of membranes (PPROM), second trimester, antepartum 11/25/2018   Antepartum placental abruption 10/05/2018   Cervical mass 10/04/2018   Fibroid uterus 10/04/2018   Anemia affecting pregnancy, antepartum 09/06/2018   Rubella non-immune status, antepartum 09/03/2018   Rh negative, antepartum 09/03/2018   Supervision of high risk pregnancy, antepartum 08/17/2018   Long-term use of immunosuppressant medication-Stelara 07/21/2018   Crohn's ileocolitis ([place]) 06/19/2016    Allergies:  Allergies  Allergen Reactions   Benadryl [Diphenhydramine]     Pt reported new onset tightness at her throat shortly after receiving 25 mg Iv Benadryl. She also was noticeably less oriented than previous   Medications:  Current Outpatient Medications:    ipratropium (ATROVENT) 0.03 % nasal spray,  Place 2 sprays into both nostrils every 12 (twelve) hours., Disp: 30 mL, Rfl: 0   moxifloxacin (VIGAMOX) 0.5 % ophthalmic solution, Apply 1 drop to eye 3 (three) times daily for 5 days., Disp: 3 mL, Rfl: 0   HYDROcodone-acetaminophen (NORCO/VICODIN) 5-325 MG tablet, Take 1 tablet by mouth every 8 (eight) hours as needed for severe pain., Disp: 40 tablet, Rfl: 0   HYDROcodone-acetaminophen (NORCO/VICODIN) 5-325 MG tablet, Take 1 tablet by mouth every 8 (eight) hours as needed for severe pain., Disp: 40 tablet, Rfl: 0   LORazepam (ATIVAN) 1 MG tablet, Take tablet by mouth 1 hour before exam. You must have a driver to the MRI due to sedation, Disp: 1 tablet, Rfl: 0   Magnesium 250 MG TABS, Take by mouth., Disp: , Rfl:    NON FORMULARY, Diltiazem 2%/Lidocaine5% compound Use 3 x rectally daily for 2 months to heal anal fissure, Disp: 30 g, Rfl: 1   predniSONE (DELTASONE) 10 MG tablet, Take 10 mg by mouth as needed., Disp: , Rfl:    risankizumab-rzaa (SKYRIZI) 600 MG/10ML injection, Infuse over 1 hour week 0, week 4 and week 8 with maintenance 360 mg SQ at week 12 then every 8 weeks., Disp: 30 mL, Rfl: 0  Observations/Objective: Patient is well-developed, well-nourished in no acute distress.  Resting comfortably at home. Appears acutely ill Head is normocephalic, atraumatic.  No labored breathing.  Breath right strip on nose with tissues placed within nares. Clear rhinorrhea Speech is clear and coherent with logical content.  Patient is alert and oriented at baseline.  B scleral and conjunctiva injected without appreciable ciliary flush. Thick mucopurulent discharge matted to bilateral upper eyelids  Assessment and Plan: 1. Acute conjunctivitis of both eyes, unspecified acute conjunctivitis type  2. Viral upper respiratory illness  Sx appear consistent with acute bacterial conjunctivitis given risk factors and appearance. Pt additionally has URI sx most consistent with viral infection. Will start  vigamox eye drops and atrovent nasal to help with congestion.  Follow Up Instructions: I discussed the assessment and treatment plan with the patient. The patient was provided an opportunity to ask questions and all were answered. The patient agreed with the plan and demonstrated an understanding of the instructions.  A copy of instructions were sent to the patient via MyChart unless otherwise noted below.    The patient was advised to call back or seek an in-person evaluation if the symptoms worsen or if the condition fails to improve as anticipated.  Time:  I spent 9 minutes with the patient via telehealth technology discussing the above problems/concerns.    [place], PA

## 2022-04-12 NOTE — Patient Instructions (Signed)
Candiss Hafen, thank you for joining [name], PA for today's virtual visit.  While this provider is not your primary care provider (PCP), if your PCP is located in our provider database this encounter information will be shared with them immediately following your visit.   [facility] account gives you access to today's visit and all your visits, tests, and labs performed at [facility] " click here if you don't have a [facility] account or go to [email or web address]  Consent: (Patient) Whitney Lowe provided verbal consent for this virtual visit at the beginning of the encounter.  Current Medications:  Current Outpatient Medications:    ipratropium (ATROVENT) 0.03 % nasal spray, Place 2 sprays into both nostrils every 12 (twelve) hours., Disp: 30 mL, Rfl: 0   moxifloxacin (VIGAMOX) 0.5 % ophthalmic solution, Apply 1 drop to eye 3 (three) times daily for 5 days., Disp: 3 mL, Rfl: 0   HYDROcodone-acetaminophen (NORCO/VICODIN) 5-325 MG tablet, Take 1 tablet by mouth every 8 (eight) hours as needed for severe pain., Disp: 40 tablet, Rfl: 0   HYDROcodone-acetaminophen (NORCO/VICODIN) 5-325 MG tablet, Take 1 tablet by mouth every 8 (eight) hours as needed for severe pain., Disp: 40 tablet, Rfl: 0   LORazepam (ATIVAN) 1 MG tablet, Take tablet by mouth 1 hour before exam. You must have a driver to the MRI due to sedation, Disp: 1 tablet, Rfl: 0   Magnesium 250 MG TABS, Take by mouth., Disp: , Rfl:    NON FORMULARY, Diltiazem 2%/Lidocaine5% compound Use 3 x rectally daily for 2 months to heal anal fissure, Disp: 30 g, Rfl: 1   predniSONE (DELTASONE) 10 MG tablet, Take 10 mg by mouth as needed., Disp: , Rfl:    risankizumab-rzaa (SKYRIZI) 600 MG/10ML injection, Infuse over 1 hour week 0, week 4 and week 8 with maintenance 360 mg SQ at week 12 then every 8 weeks., Disp: 30 mL, Rfl: 0   Medications ordered in this encounter:  Meds ordered this encounter   Medications   moxifloxacin (VIGAMOX) 0.5 % ophthalmic solution    Sig: Apply 1 drop to eye 3 (three) times daily for 5 days.    Dispense:  3 mL    Refill:  0    Order Specific Question:   Supervising Provider    Answer:   Chase Picket [[id number]]   ipratropium (ATROVENT) 0.03 % nasal spray    Sig: Place 2 sprays into both nostrils every 12 (twelve) hours.    Dispense:  30 mL    Refill:  0    Order Specific Question:   Supervising Provider    Answer:   Chase Picket [[id number]]     *If you need refills on other medications prior to your next appointment, please contact your pharmacy*  Follow-Up: Call back or seek an in-person evaluation if the symptoms worsen or if the condition fails to improve as anticipated.  [place] [phone]  Other Instructions You have pinkeye, which is HIGHLY CONTAGIOUS. Please avoid touching your eye; if you do, [place]! I have prescribed vigamox eye drops. Use this on both eyes three times daily x 5 days. Warm moist washcloths with Wynetta Emery and Johnson baby shampoo can be used to gently massage and cleans to eyes. Use the nasal spray to help with your congestion. Also consider humidifier use and OTC zyrtec. If you develop a fever, eye pain, change in vision, please head to an in person urgent care.  If you have been instructed to have an in-person evaluation today at a local Urgent Care facility, please use the link below. It will take you to a list of all of our available [facility], including address, phone number and hours of operation. Please do not delay care.  [facility]  If you or a family member do not have a primary care provider, use the link below to schedule a visit and establish care. When you choose a [facility] primary care physician or advanced practice provider, you gain a long-term partner in health. Find a Primary Care Provider  Learn more about [facility]'s in-office  and virtual care options: [facility]

## 2022-04-22 ENCOUNTER — Telehealth: Payer: Self-pay

## 2022-04-22 NOTE — Telephone Encounter (Signed)
Left detailed message for patient to remind her to come in for TB testing that is required by [facility].

## 2022-04-28 NOTE — Telephone Encounter (Signed)
Spoke with patient & reminded her to come in for TB test that is required for [facility]. Advised her on when/where to go for labs. She stated her insurance has recently changed & she's unsure what her cost would be for lab work and infusions going forward. Advised that she contact her insurance company as well as [facility] to get an update on if there will be any financial changes. Patient verbalized all understanding.

## 2022-04-30 ENCOUNTER — Other Ambulatory Visit: Payer: Self-pay | Admitting: Physician Assistant

## 2022-05-01 NOTE — Telephone Encounter (Signed)
Noted. Will continue to monitor for lab to result.

## 2022-05-01 NOTE — Telephone Encounter (Addendum)
Patient stated she got her TB through [employer] instead of going to the lab in the basement because of insurance purposes. She also stated we would receive the results within three to four days.

## 2022-05-02 LAB — QUANTIFERON-TB GOLD PLUS
Mitogen-NIL: >10 IU/mL
NIL: 0.03 IU/mL
QuantiFERON-TB Gold Plus: NEGATIVE
TB1-NIL: 0.01 IU/mL
TB2-NIL: 0.01 IU/mL

## 2022-05-06 NOTE — Telephone Encounter (Signed)
TB results received & faxed to [facility]. Will follow up with this to ensure fax is received.

## 2022-05-07 NOTE — Telephone Encounter (Signed)
[facility] & they stated fax may have been received, but it has not been uploaded into chart; however prior authorization is currently in process already. Will follow up at the end of the week to ensure lab result was received & put into patient's chart.

## 2022-05-09 NOTE — Telephone Encounter (Signed)
Called [facility] to follow up on TB result & ensure they received fax. They stated they are behind on faxes, so it is possible they have received it and that it may not have been scanned into chart yet. Refaxed to two different numbers provided again. [phone] & [phone].

## 2022-05-22 ENCOUNTER — Encounter: Payer: Self-pay | Admitting: Internal Medicine

## 2022-06-03 ENCOUNTER — Telehealth: Payer: Self-pay | Admitting: Physician Assistant

## 2022-06-03 NOTE — Telephone Encounter (Signed)
Patient is calling very distraught wishing to speak with the nurse, states she went to her appointment today at Dr. Orville Govern office and their systems were down and unable to see patients. She is not sure where to go from here states she is in a lot of pain. Please advise

## 2022-06-03 NOTE — Telephone Encounter (Signed)
Pt very upset, she was scheduled to see Dr. Drue Flirt at the [facility] office today and her appt was cancelled because the computer system went down. I called the office to confirm and was told they did cancel her clinic today. Pt states she is still having a lot of bleeding and is in a lot of pain. She is going to call the office to reschedule the appt. Pt wants to know what she should do until she can see Dr. Drue Flirt, she waited a month for this appt. Please advise.

## 2022-06-05 NOTE — Telephone Encounter (Signed)
She sent a mychart message and has been rescheduled to see Dr. Drue Flirt next week.

## 2022-06-05 NOTE — Telephone Encounter (Signed)
Hopefully she can get back into see Dr. Drue Flirt rather quickly  Would she be willing to try hydrocortisone suppository 25 mg nightly x 7 to 10 days?

## 2022-06-10 DIAGNOSIS — Z0279 Encounter for issue of other medical certificate: Secondary | ICD-10-CM

## 2022-06-12 ENCOUNTER — Ambulatory Visit: Payer: 59 | Admitting: Internal Medicine

## 2022-06-12 ENCOUNTER — Other Ambulatory Visit: Payer: 59

## 2022-06-12 ENCOUNTER — Encounter: Payer: Self-pay | Admitting: Internal Medicine

## 2022-06-12 VITALS — BP 80/60 | HR 89 | Ht <= 58 in | Wt 94.0 lb

## 2022-06-12 DIAGNOSIS — K6289 Other specified diseases of anus and rectum: Secondary | ICD-10-CM | POA: Diagnosis not present

## 2022-06-12 DIAGNOSIS — K50111 Crohn's disease of large intestine with rectal bleeding: Secondary | ICD-10-CM

## 2022-06-12 DIAGNOSIS — K50811 Crohn's disease of both small and large intestine with rectal bleeding: Secondary | ICD-10-CM

## 2022-06-12 DIAGNOSIS — K625 Hemorrhage of anus and rectum: Secondary | ICD-10-CM

## 2022-06-12 MED ORDER — PREDNISONE 10 MG PO TABS: ORAL_TABLET | ORAL | 0 refills | Status: DC

## 2022-06-12 MED ORDER — HYDROCODONE-ACETAMINOPHEN 5-325 MG PO TABS: 1.0000 | ORAL_TABLET | Freq: Three times a day (TID) | ORAL | 0 refills | Status: DC | PRN

## 2022-06-12 NOTE — Telephone Encounter (Signed)
Pt seen in office today, see that note for details

## 2022-06-12 NOTE — Progress Notes (Signed)
Subjective:    Patient ID: Whitney Lowe, female    DOB: Aug 19, 1985, 37 y.o.   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 difficile, uterine fibroids, ADHD, bipolar depression who is here for follow-up.  She is here alone today and was last seen in October by Vicie Mutters, PA-C.  She reports that she has been taking prednisone 20 mg daily and MiraLAX 17 g daily.  She has good and bad days most days being significant and severe perianal pain, burning and bleeding with bowel movement.  She is not having abdominal pain.  Difficult time with working, sitting, walking on some days.  Needing hydrocodone but avoiding this because it makes her sleepy.  She has not yet started [employer].  Last Entyvio infusion was November.  She did have a very elevated fecal calprotectin.  Appetite fluctuates but no nausea or vomiting.  No partial obstructive symptoms.  Plan is for exam under anesthesia and possible fissure treatment, exclude abscess with Dr. Drue Flirt Monday.  Note below copied from surgical appointment yesterday.  From Colorectal Surg note this week: "1. Crohn's disease of colon with complication ([place]) [id number]   2. Anal fissure K60.2   Whitney Lowe is a 37 y.o. year old female with perianal Crohn's disease  PLAN:  We discussed that we would need to do an exam in the operating room with sigmoidoscopy to completely evaluate the cause of her pain. We would possibly inject some kenalog and exparel as well and drain any abscess if identified. She understands and is in agreement with this plan. Consent signed today. She will keep her scheduled f/u with Dr. Hilarie Fredrickson. All of her questions were welcomed and answered today.  I have personally spent 19 minutes involved in face-to-face and non-face-to-face activities for this patient on the day of the visit. Professional time spent  includes the following activities, in addition to those noted in the documentation: review of previous clinic notes and exams, medication list, Dr. Hilarie Fredrickson GI clinic notes and exams.   Electronically signed by: Harmon Dun, NP 06/10/2022 10:43 AM"   Review of Systems As per HPI, otherwise negative  Current Medications, Allergies, Past Medical History, Past Surgical History, Family History and Social History were reviewed in [facility] record.    Objective:   Physical Exam BP (!) 80/60   Pulse 89   Ht 4' 10"$  (1.473 m)   Wt 94 lb (42.6 kg)   BMI 19.65 kg/m  Gen: awake, alert, NAD HEENT: anicteric  Neuro: nonfocal  Fecal cal: 6970 [address]: 6.9 (goal > 14 during maintenance); Ab negative     Latest Ref Rng & Units 03/04/2022   10:05 AM 04/19/2021   11:56 AM 07/12/2020    6:50 PM  CBC  WBC 4.0 - 10.5 K/uL 9.7  6.6  9.7   Hemoglobin 12.0 - 15.0 g/dL 13.3  13.2  13.9   Hematocrit 36.0 - 46.0 % 40.0  40.2  41.3   Platelets 150.0 - 400.0 K/uL 481.0  443.0  447    CMP     Component Value Date/Time   NA 138 03/04/2022 1005   K 3.9 03/04/2022 1005   CL 101 03/04/2022 1005   CO2 28 03/04/2022 1005   GLUCOSE 123 (H) 03/04/2022 1005   BUN 11 03/04/2022 1005   CREATININE 0.79 03/04/2022 1005   CALCIUM 9.2 03/04/2022 1005  PROT 7.6 03/04/2022 1005   ALBUMIN 4.0 03/04/2022 1005   AST 13 03/04/2022 1005   ALT 12 03/04/2022 1005   ALKPHOS 71 03/04/2022 1005   BILITOT 0.3 03/04/2022 1005   GFRNONAA >60 04/20/2020 1958   GFRAA >60 01/07/2019 1417       Assessment & Plan:  37 year old female with a history of ileocolonic Crohn's disease diagnosed in March 2018, history of erythema nodosum, prior C. difficile, uterine fibroids, ADHD, bipolar depression who is here for follow-up.   Crohn's disease of the small intestine, colon and perianus --I am concerned that her anal pain is Crohn's related.  This was seen at her last colonoscopy.  Her current therapy  which is [name] has not been effective at helping her reach clinical or endoscopic/radiographic remission.  Fecal calprotectin very very elevated.  Her Entyvio level was subtherapeutic for maintenance though she did not have antibodies.  I feel that Orson Ape would be much more effective therapy for her than Entyvio and we will change her to this with standard induction at this time.  Plan as follows: -- Exam under anesthesia and management of perianal pain per Dr. Drue Flirt next Monday -- Prednisone taper 20 mg x 2 weeks, 50 mg x 2 weeks, 10 mg x 2 weeks, 5 mg x 2 weeks and stop -- Begin Skyrizi infusions -- Stopping Entyvio due to lack of complete efficacy -- Hydrocodone 5 mg 1 tablet every 8 hours as needed for severe pain; she is very cautious and using this medication infrequently.  Once we improved perianal pain hopefully surgically and medically she will not need this medicine  2.  IDA --secondary to #1.  Improved with IV iron.  Monitor blood counts routinely plus iron stores  30 minutes total spent today including patient facing time, coordination of care, reviewing medical history/procedures/pertinent radiology studies, and documentation of the encounter.

## 2022-06-12 NOTE — Patient Instructions (Addendum)
We have sent the following medications to your pharmacy for you to pick up at your convenience: Prednisone: take 20 mg once daily for 2 weeks, 34m daily for 2 weeks, 162mdaily for 2 weeks, 19m76maily for 2 weeks, then discontinue  We have scheduled a follow up for you May 21 at 10:10am.  We will inquire about your skyrizi and Dr PyrHilarie Fredricksons sent hydrocodone for you.      _______________________________________________________  If your blood pressure at your visit was 140/90 or greater, please contact your primary care physician to follow up on this.  _______________________________________________________  If you are age 38 31 older, your body mass index should be between 23-30. Your Body mass index is 19.65 kg/m. If this is out of the aforementioned range listed, please consider follow up with your Primary Care Provider.  If you are age 11 76 younger, your body mass index should be between 19-25. Your Body mass index is 19.65 kg/m. If this is out of the aformentioned range listed, please consider follow up with your Primary Care Provider.   ________________________________________________________  The [facility] providers would like to encourage you to use [facility] communicate with providers for non-urgent requests or questions.  Due to long hold times on the telephone, sending your provider a message by [facility] be a faster and more efficient way to get a response.  Please allow 48 business hours for a response.  Please remember that this is for non-urgent requests.  _______________________________________________________ Thank you for trusting me with your gastrointestinal care!   JayUlice Dashrtle

## 2022-06-13 LAB — HEPATITIS B SURFACE ANTIBODY,QUALITATIVE: Hep B S Ab: REACTIVE — AB

## 2022-06-13 LAB — HEPATITIS C ANTIBODY: Hepatitis C Ab: NONREACTIVE

## 2022-06-13 LAB — HEPATITIS B SURFACE ANTIGEN: Hepatitis B Surface Ag: NONREACTIVE

## 2022-06-13 NOTE — Telephone Encounter (Signed)
I placed the completed forms I had in my outbox on Monday of this week Can we track those down and if not I need to forms again Thanks JMP

## 2022-06-13 NOTE — Progress Notes (Signed)
Pt wants to get the tx at [facility].

## 2022-06-21 ENCOUNTER — Other Ambulatory Visit: Payer: Self-pay | Admitting: Internal Medicine

## 2022-06-23 MED ORDER — HYDROCODONE-ACETAMINOPHEN 5-325 MG PO TABS: 1.0000 | ORAL_TABLET | Freq: Four times a day (QID) | ORAL | 0 refills | Status: DC | PRN

## 2022-06-23 NOTE — Telephone Encounter (Signed)
I sent a RX on 06/12/22; can we verify the pharmacy and if the patient received this rx Thanks JMP

## 2022-06-23 NOTE — Telephone Encounter (Signed)
I spoke with the pharmacy and the rx is there on hold. Per her insurance she can only have 5 days worth. If needs the rest of the rx needs a prior authorization. She said she would be okay with 5 days worth. The pharmacy will fill it Sir.

## 2022-06-23 NOTE — Telephone Encounter (Signed)
Can we do a prior auth, she has crohn's and will need this available longer than 5 days Crohn's disease of the ileum and perianal skin

## 2022-06-23 NOTE — Telephone Encounter (Signed)
The pharmacy said for you to resend a rx for #25 . It will kick it out and we will forward it to the prior authorization team Sir.

## 2022-06-23 NOTE — Telephone Encounter (Signed)
I called the pharmacy to send over the PA on the Vicodin so I could get that started. Per pharmacist Whitney Lowe it doesn't need one she'll just have to come back another day and pick it up. Whitney Lowe is aware of this.

## 2022-07-07 ENCOUNTER — Telehealth: Payer: Self-pay

## 2022-07-07 NOTE — Telephone Encounter (Signed)
PA request received via CMM for HYDROcodone-Acetaminophen 5-325MG  tablets  PA has been submitted to [facility] Medicaid and is pending determination.  Key: [id number]

## 2022-07-08 NOTE — Telephone Encounter (Signed)
Faxed over office and procedure notes as asked in denial letter provided in patients chart.

## 2022-07-08 NOTE — Telephone Encounter (Signed)
PA has been DENIED. Denial letter has been attached in patients documents.  

## 2022-07-10 NOTE — Telephone Encounter (Signed)
Patient Advocate Encounter  Prior Authorization for HYDROCODONE 5-325MG  has been approved.    PA# [id number] Effective dates: 3.19.24 through 9.15.24

## 2022-08-07 ENCOUNTER — Telehealth: Payer: Self-pay

## 2022-08-07 NOTE — Telephone Encounter (Signed)
Received a call from Sarah at [facility]. Maralyn Sago states that patient has no call no showed her last 2 appointment for [facility]. Maralyn Sago wanted to know how to proceed, whether or not they should continue to try to reach patient or discharge her since she has no call no showed more than once. Authorization is also about to expire on 4/27, Maralyn Sago said if patient can be scheduled for her infusion before then that would be great. Maralyn Sago states that insurance looks at the missed visits as well. I informed sarah that I would leave a message for Dr. Lauro Franklin nurse who returns on Monday.   Callback # [phone] EXT: [phone]

## 2022-08-11 NOTE — Telephone Encounter (Signed)
We need to resume Skyrizi at this time This is okay with Dr. Drue Dun as per her office visit on 07/29/2022 with the patient

## 2022-08-11 NOTE — Telephone Encounter (Signed)
Spoke with pt and she is aware and states she will contact [facility] to get scheduled for the [employer].  Pt wanted to know if Dr. Rhea Belton had completed the [facility] paperwork. States part C was the area that needed more information. Please advise.

## 2022-08-11 NOTE — Telephone Encounter (Signed)
Dr. Rhea Belton please see note below regarding pts skyrizi. Was her tx on hold while she was seeing surgeon?

## 2022-08-11 NOTE — Telephone Encounter (Signed)
Part C clarified and placed in my outbox for Whitney Lowe or [employer]

## 2022-08-11 NOTE — Telephone Encounter (Signed)
Faxed form back for FMLA clarification.

## 2022-08-13 NOTE — Telephone Encounter (Signed)
Pt sent another message with an attachment as to what exactly she needs for section C. I printed it off and attached it to the form again and it is in your inbox.

## 2022-08-14 ENCOUNTER — Telehealth: Payer: Self-pay | Admitting: *Deleted

## 2022-08-14 NOTE — Telephone Encounter (Signed)
Addendum to patients FMLA work has been completed by Dr Rhea Belton and faxed to fax number [phone]. See note under "media" tab in epic.

## 2022-08-29 ENCOUNTER — Telehealth: Payer: Self-pay

## 2022-08-29 NOTE — Telephone Encounter (Signed)
Pt sent a mychart message that she missed her Skyrizi appt at [facility] as she did not have anyone to watch her child. When she called to reschedule the appt she was told the auth had expired. Call placed to [facility] to see what our office needed to do. Was told pt has not had treatment since 03/05/22 and they will need an ok to proceed as there has been delay. Also have to wait for new auth. Left message with pts insurance case worker there to call back so we will know exactly what needs to be done. Was also told pt has been counseled on noncompliance. Will await further communication from [place].

## 2022-09-03 NOTE — Telephone Encounter (Signed)
Spoke with [employer] at [facility] and gave verbal ok for pt to resume the [employer]. They are going to work on getting the auth approved and will contact pt.

## 2022-09-09 ENCOUNTER — Ambulatory Visit: Payer: 59 | Admitting: Internal Medicine

## 2022-10-01 NOTE — Telephone Encounter (Signed)
Would continue the [facility] and reschedule office visit.  She did no-show her last appointment

## 2022-11-19 ENCOUNTER — Telehealth: Payer: Self-pay

## 2022-11-19 NOTE — Telephone Encounter (Signed)
Received rx request for skyrizi for this pt. She has finished infusions and needs to start on the pen. Please advise regarding the dose for her.

## 2022-11-19 NOTE — Telephone Encounter (Signed)
Skyrizi 360 mg on body injector every 8 weeks

## 2022-11-20 NOTE — Telephone Encounter (Signed)
Spoke with pt and her Cristy Folks needs another PA.  Received fax from [employer] rx for new script. Sent in [place] 360mg  on body injector, SQ every 8 weeks. Pt aware and she knows the PA team will be notified. She need med asap as she has finished the [employer] infusions.

## 2022-11-20 NOTE — Telephone Encounter (Signed)
Inbound call from patient stating she has changed her insurance to [employer] and the pharmacy is now requesting a new authorization for skyrizi.

## 2022-11-21 NOTE — Telephone Encounter (Signed)
Form faxed to rx prior auth team from [facility] regarding PA.

## 2022-11-24 ENCOUNTER — Telehealth: Payer: Self-pay | Admitting: Pharmacy Technician

## 2022-11-24 NOTE — Telephone Encounter (Signed)
Pharmacy Patient Advocate Encounter   Received notification from CoverMyMeds that prior authorization for SKYRIZI 360MG  is required/requested.   Insurance verification completed.   The patient is insured through [facility] .   Per test claim: PA required; PA submitted to [facility] via CoverMyMeds Key/confirmation #/EOC [facility] Status is pending

## 2022-12-15 ENCOUNTER — Telehealth: Payer: Self-pay | Admitting: Internal Medicine

## 2022-12-15 ENCOUNTER — Ambulatory Visit: Payer: 59 | Admitting: Gastroenterology

## 2022-12-15 NOTE — Progress Notes (Deleted)
Chief Complaint: Primary GI MD:  HPI: 37 year old female history of ileocolonic Crohn's disease diagnosed March 2018, erythematous nodosum, uterine fibroids, bipolar depression, prior history of C. difficile, presents for follow-up.  Last seen 06/12/2022 by Dr. Rhea Belton.  At that time she had been taking prednisone 20 Mg daily and MiraLAX 17G daily.  She also reported significant severe perianal pain, burning, bleeding with bowel movements.  She was previously on Entyvio with plans to start Olar.  Thompson Grayer was not effective both clinically or endoscopically as her fecal calprotectin remained very elevated 02/2022 (6,970) and her Entyvio level was subtherapeutic for maintenance, though she did not have antibodies.  Dr. Rhea Belton put patient on prednisone taper and recommended to begin Skyrizi infusions  Patient underwent exam under anesthesia with Dr. Drue Dun for possible fissure treatment/exclude abscess 06/16/2022.  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    Copied from note "Denuded area of mucosa along anterior rectum.  Healthy sigmoid colonic mucosa without lesions present.  Excision of intra anal skin tag.  Injection of Exparel for partial perianal block.  Biopsies of the skin tag showed chronic inflammation."   PREVIOUS GI WORKUP     Past Medical History:  Diagnosis Date   ADHD    stopped abilify with preg   ADHD (attention deficit hyperactivity disorder)    Anemia    Anxiety    not currently on meds   Bipolar    Cataract    Rt eye   Complication of anesthesia    became combative    Crohn's ileocolitis (HCC) 06/19/2016   Eczema    Fibroids    uterine   Infection    UTI   Ovarian cyst    Periumbilical hernia     Past Surgical History:  Procedure Laterality Date   CESAREAN SECTION N/A 01/01/2019   Procedure: CESAREAN SECTION;  Surgeon: Conan Bowens, MD;  Location: [facility] LD ORS;  Service: Obstetrics;   Laterality: N/A;   COLONOSCOPY     ESOPHAGOGASTRODUODENOSCOPY     removal of cyst in ear     6th grade    Current Outpatient Medications  Medication Sig Dispense Refill   HYDROcodone-acetaminophen (NORCO/VICODIN) 5-325 MG tablet Take 1-2 tablets by mouth every 6 (six) hours as needed for severe pain. 25 tablet 0   ipratropium (ATROVENT) 0.03 % nasal spray Place 2 sprays into both nostrils every 12 (twelve) hours. 30 mL 0   LORazepam (ATIVAN) 1 MG tablet Take tablet by mouth 1 hour before exam. You must have a driver to the MRI due to sedation 1 tablet 0   Magnesium 250 MG TABS Take by mouth.     NON FORMULARY Diltiazem 2%/Lidocaine5% compound Use 3 x rectally daily for 2 months to heal anal fissure 30 g 1   predniSONE (DELTASONE) 10 MG tablet 20mg  PO daily x 2weeks , 15mg  PO daily x2 weeks, 10mg  PO daily x2 weeks, 5mg  PO daily x2 weeks, then DC 75 tablet 0   risankizumab-rzaa (SKYRIZI) 600 MG/10ML injection Infuse over 1 hour week 0, week 4 and week 8 with maintenance 360 mg SQ at week 12 then every 8 weeks. 30 mL 0   No current facility-administered medications for this visit.    Allergies as of 12/15/2022 - Review Complete 06/12/2022  Allergen Reaction Noted   Benadryl [diphenhydramine]  01/07/2019    Family History  Problem Relation Age of Onset   Crohn's disease Mother  mom was adopted   Heart disease Mother    Anxiety disorder Mother    Blindness Mother        Rt eye   Liver cancer Father    Diabetes Mellitus II Father    COPD Father    Hypertension Father    Asthma Father    ADD / ADHD Father    Diabetes Father        type 2   Anxiety disorder Father    Depression Father    Diabetes Sister        Type 2   Colon cancer Neg Hx    Esophageal cancer Neg Hx    Pancreatic cancer Neg Hx    Stomach cancer Neg Hx    Liver disease Neg Hx    Rectal cancer Neg Hx     Social History   Socioeconomic History   Marital status: Married    Spouse name: Marqus Raineri    Number of children: 1   Years of education: Not on file   Highest education level: Not on file  Occupational History   Occupation: Runner, broadcasting/film/video    Comment: [employer]  Tobacco Use   Smoking status: Former    Current packs/day: 0.00    Types: Cigars, Cigarettes    Quit date: 04/21/2018    Years since quitting: 4.6   Smokeless tobacco: Never   Tobacco comments:    social smoker  Vaping Use   Vaping status: Never Used  Substance and Sexual Activity   Alcohol use: Yes    Comment: rare   Drug use: Not Currently    Types: Marijuana    Comment: last use february 2020-    Sexual activity: Not Currently    Birth control/protection: None  Other Topics Concern   Not on file  Social History Narrative   Lives w/ fiance   Pregnant    BS psychology   [employer]   Social Determinants of Health   Financial Resource Strain: Low Risk  (07/21/2022)   Received from [facility], [facility]   Overall Financial Resource Strain (CARDIA)    Difficulty of Paying Living Expenses: Not hard at all  Food Insecurity: No Food Insecurity (07/21/2022)   Received from [facility], [facility]   Hunger Vital Sign    Worried About Running Out of Food in the Last Year: Never true    Ran Out of Food in the Last Year: Never true  Transportation Needs: No Transportation Needs (07/21/2022)   Received from [employer], [facility]   PRAPARE - Transportation    Lack of Transportation (Medical): No    Lack of Transportation (Non-Medical): No  Physical Activity: Not on file  Stress: Not on file  Social Connections: Unknown (08/22/2021)   [facility], [facility]   Social Network    Social Network: Not on file  Intimate Partner Violence: Unknown (07/22/2021)   Received from [facility], [facility]   HITS    Physically Hurt: Not on file    Insult or Talk Down To: Not on file    Threaten Physical Harm: Not on file    Scream or Curse: Not on file    Review of Systems:     Constitutional: No weight loss, fever, chills, weakness or fatigue HEENT: Eyes: No change in vision               Ears, Nose, Throat:  No change in hearing or congestion Skin: No rash or  itching Cardiovascular: No chest pain, chest pressure or palpitations   Respiratory: No SOB or cough Gastrointestinal: See HPI and otherwise negative Genitourinary: No dysuria or change in urinary frequency Neurological: No headache, dizziness or syncope Musculoskeletal: No new muscle or joint pain Hematologic: No bleeding or bruising Psychiatric: No history of depression or anxiety    Physical Exam:  Vital signs: There were no vitals taken for this visit.  Constitutional: NAD, Well developed, Well nourished, alert and cooperative Head:  Normocephalic and atraumatic. Eyes:   PEERL, EOMI. No icterus. Conjunctiva pink. Respiratory: Respirations even and unlabored. Lungs clear to auscultation bilaterally.   No wheezes, crackles, or rhonchi.  Cardiovascular:  Regular rate and rhythm. No peripheral edema, cyanosis or pallor.  Gastrointestinal:  Soft, nondistended, nontender. No rebound or guarding. Normal bowel sounds. No appreciable masses or hepatomegaly. Rectal:  Not performed.  Msk:  Symmetrical without gross deformities. Without edema, no deformity or joint abnormality.  Neurologic:  Alert and  oriented x4;  grossly normal neurologically.  Skin:   Dry and intact without significant lesions or rashes. Psychiatric: Oriented to person, place and time. Demonstrates good judgement and reason without abnormal affect or behaviors.   RELEVANT LABS AND IMAGING: CBC    Component Value Date/Time   WBC 9.7 03/04/2022 1005   RBC 4.49 03/04/2022 1005   HGB 13.3 03/04/2022 1005   HGB 9.8 (L) 09/02/2018 1651   HCT 40.0 03/04/2022 1005   HCT 28.5 (L) 09/02/2018 1651   PLT 481.0 (H) 03/04/2022 1005   PLT 492 (H) 09/02/2018 1651   MCV 89.1 03/04/2022 1005   MCV 84 09/02/2018 1651   MCH 29.3 07/12/2020  1850   MCHC 33.3 03/04/2022 1005   RDW 14.1 03/04/2022 1005   RDW 14.7 09/02/2018 1651   LYMPHSABS 1.1 03/04/2022 1005   LYMPHSABS 2.9 09/02/2018 1651   MONOABS 0.1 03/04/2022 1005   EOSABS 0.0 03/04/2022 1005   EOSABS 0.4 09/02/2018 1651   BASOSABS 0.1 03/04/2022 1005   BASOSABS 0.0 09/02/2018 1651    CMP     Component Value Date/Time   NA 138 03/04/2022 1005   K 3.9 03/04/2022 1005   CL 101 03/04/2022 1005   CO2 28 03/04/2022 1005   GLUCOSE 123 (H) 03/04/2022 1005   BUN 11 03/04/2022 1005   CREATININE 0.79 03/04/2022 1005   CALCIUM 9.2 03/04/2022 1005   PROT 7.6 03/04/2022 1005   ALBUMIN 4.0 03/04/2022 1005   AST 13 03/04/2022 1005   ALT 12 03/04/2022 1005   ALKPHOS 71 03/04/2022 1005   BILITOT 0.3 03/04/2022 1005   GFRNONAA >60 04/20/2020 1958   GFRAA >60 01/07/2019 1417     Assessment/Plan:       Lara Mulch [facility] 12/15/2022, 8:42 AM  Cc: No ref. provider found

## 2022-12-15 NOTE — Telephone Encounter (Signed)
PT is wanting discuss some medications with nurse. Please advise.

## 2022-12-16 NOTE — Telephone Encounter (Signed)
Left message for pt to call back  °

## 2022-12-18 NOTE — Telephone Encounter (Signed)
Pt has not returned phone call. Will await further communication from  pt.

## 2022-12-31 ENCOUNTER — Other Ambulatory Visit: Payer: Self-pay

## 2022-12-31 ENCOUNTER — Encounter (HOSPITAL_COMMUNITY): Payer: Self-pay | Admitting: *Deleted

## 2022-12-31 ENCOUNTER — Ambulatory Visit (HOSPITAL_COMMUNITY)
Admission: EM | Admit: 2022-12-31 | Discharge: 2022-12-31 | Disposition: A | Discharge status: Home / Self Care | Payer: 59 | Attending: Emergency Medicine | Admitting: Emergency Medicine

## 2022-12-31 DIAGNOSIS — J020 Streptococcal pharyngitis: Secondary | ICD-10-CM

## 2022-12-31 MED ORDER — ACETAMINOPHEN 325 MG PO TABS
ORAL_TABLET | ORAL | Status: AC
  Filled 2022-12-31: qty 3

## 2022-12-31 MED ORDER — AMOXICILLIN 500 MG PO CAPS: 1000.0000 mg | ORAL_CAPSULE | Freq: Every day | ORAL | 0 refills | Status: AC

## 2022-12-31 MED ORDER — ACETAMINOPHEN 325 MG PO TABS
975.0000 mg | ORAL_TABLET | Freq: Once | ORAL | Status: AC
  Administered 2022-12-31: 975 mg via ORAL

## 2022-12-31 MED ORDER — PREDNISOLONE SODIUM PHOSPHATE 15 MG/5ML PO SOLN: 40.0000 mg | Freq: Once | ORAL | Status: DC

## 2022-12-31 MED ORDER — PREDNISOLONE SODIUM PHOSPHATE 15 MG/5ML PO SOLN
ORAL | Status: AC
  Filled 2022-12-31: qty 3

## 2022-12-31 NOTE — ED Provider Notes (Signed)
[facility]    CSN: [id number] Arrival date & time: 12/31/22  1002      History   Chief Complaint Chief Complaint  Patient presents with   Sore Throat   Otalgia   Fever    HPI Whitney Lowe is a 37 y.o. female.   Patient presents to clinic for sore throat, fever, and ear pain that started Friday.  Her symptoms were worse throughout the weekend, she thought they got better, but this morning she woke up with a severe sore throat. Toleration PO food and fluids.   She has a history of strep throat infections, most recent in 2009.  She also recently quit smoking marijuana, is wondering if this is related.  Denies any nasal congestion, abdominal pain, nausea, vomiting, or recent sick contacts.  She has not had any medication for her symptoms today, did have tea and elderberry this AM.   The history is provided by the patient and medical records.  Sore Throat Pertinent negatives include no chest pain and no abdominal pain.  Otalgia Associated symptoms: fever and sore throat   Associated symptoms: no abdominal pain, no cough, no diarrhea and no vomiting   Fever Associated symptoms: ear pain and sore throat   Associated symptoms: no chest pain, no cough, no diarrhea, no nausea and no vomiting     Past Medical History:  Diagnosis Date   ADHD    stopped abilify with preg   ADHD (attention deficit hyperactivity disorder)    Anemia    Anxiety    not currently on meds   Bipolar    Cataract    Rt eye   Complication of anesthesia    became combative    Crohn's ileocolitis (HCC) 06/19/2016   Eczema    Fibroids    uterine   Infection    UTI   Ovarian cyst    Periumbilical hernia     Patient Active Problem List   Diagnosis Date Noted   IDA (iron deficiency anemia) 04/22/2021   Placental abruption 01/01/2019   Tachycardia 12/01/2018   Preterm premature rupture of membranes (PPROM), second trimester, antepartum 11/25/2018   Antepartum placental abruption  10/05/2018   Cervical mass 10/04/2018   Fibroid uterus 10/04/2018   Anemia affecting pregnancy, antepartum 09/06/2018   Rubella non-immune status, antepartum 09/03/2018   Rh negative, antepartum 09/03/2018   Supervision of high risk pregnancy, antepartum 08/17/2018   Long-term use of immunosuppressant medication-Stelara 07/21/2018   Crohn's ileocolitis (HCC) 06/19/2016    Past Surgical History:  Procedure Laterality Date   CESAREAN SECTION N/A 01/01/2019   Procedure: CESAREAN SECTION;  Surgeon: Conan Bowens, MD;  Location: [facility];  Service: Obstetrics;  Laterality: N/A;   COLONOSCOPY     ESOPHAGOGASTRODUODENOSCOPY     removal of cyst in ear     6th grade    OB History     Gravida  3   Para  1   Term      Preterm  1   AB  2   Living  1      SAB  1   IAB  1   Ectopic      Multiple  0   Live Births  1            Home Medications    Prior to Admission medications   Medication Sig Start Date End Date Taking? Authorizing Provider  amoxicillin (AMOXIL) 500 MG capsule Take 2 capsules (1,000 mg total) by  mouth daily for 10 days. 12/31/22 01/10/23 Yes Rinaldo Ratel, [place], FNP  risankizumab-rzaa [facility]) 600 MG/10ML injection Infuse over 1 hour week 0, week 4 and week 8 with maintenance 360 mg SQ at week 12 then every 8 weeks. 03/17/22   Doree Albee, PA-C    Family History Family History  Problem Relation Age of Onset   Crohn's disease Mother        mom was adopted   Heart disease Mother    Anxiety disorder Mother    Blindness Mother        Rt eye   Liver cancer Father    Diabetes Mellitus II Father    COPD Father    Hypertension Father    Asthma Father    ADD / ADHD Father    Diabetes Father        type 2   Anxiety disorder Father    Depression Father    Diabetes Sister        Type 2   Colon cancer Neg Hx    Esophageal cancer Neg Hx    Pancreatic cancer Neg Hx    Stomach cancer Neg Hx    Liver disease Neg Hx    Rectal cancer Neg Hx      Social History Social History   Tobacco Use   Smoking status: Former    Current packs/day: 0.00    Types: Cigars, Cigarettes    Quit date: 04/21/2018    Years since quitting: 4.6   Smokeless tobacco: Never   Tobacco comments:    social smoker  Vaping Use   Vaping status: Never Used  Substance Use Topics   Alcohol use: Yes    Comment: rare   Drug use: Not Currently    Types: Marijuana    Comment: last use february 2020-      Allergies   Benadryl [diphenhydramine]   Review of Systems Review of Systems  Constitutional:  Positive for fever.  HENT:  Positive for ear pain and sore throat.   Respiratory:  Negative for cough.   Cardiovascular:  Negative for chest pain.  Gastrointestinal:  Negative for abdominal pain, diarrhea, nausea and vomiting.     Physical Exam Triage Vital Signs ED Triage Vitals  Encounter Vitals Group     BP 12/31/22 1116 111/76     Systolic BP Percentile --      Diastolic BP Percentile --      Pulse Rate 12/31/22 1116 (!) 114     Resp 12/31/22 1116 18     Temp 12/31/22 1116 99.4 F (37.4 C)     Temp src --      SpO2 12/31/22 1116 99 %     Weight --      Height --      Head Circumference --      Peak Flow --      Pain Score 12/31/22 1117 8     Pain Loc --      Pain Education --      Exclude from Growth Chart --    No data found.  Updated Vital Signs BP 111/76   Pulse (!) 114   Temp 99.4 F (37.4 C)   Resp 18   LMP 11/28/2022   SpO2 99%   Visual Acuity Right Eye Distance:   Left Eye Distance:   Bilateral Distance:    Right Eye Near:   Left Eye Near:    Bilateral Near:     Physical Exam Vitals  and nursing note reviewed.  Constitutional:      Appearance: She is well-developed.  HENT:     Head: Normocephalic and atraumatic.     Nose: No congestion or rhinorrhea.     Mouth/Throat:     Mouth: Mucous membranes are moist.     Pharynx: Uvula midline. Posterior oropharyngeal erythema present.     Tonsils: Tonsillar  exudate present. No tonsillar abscesses. 2+ on the right. 2+ on the left.  Eyes:     Conjunctiva/sclera: Conjunctivae normal.     Pupils: Pupils are equal, round, and reactive to light.  Cardiovascular:     Rate and Rhythm: Normal rate.  Pulmonary:     Effort: Pulmonary effort is normal. No respiratory distress.  Skin:    General: Skin is warm and dry.  Neurological:     General: No focal deficit present.     Mental Status: She is alert and oriented to person, place, and time.  Psychiatric:        Mood and Affect: Mood normal.        Behavior: Behavior normal.      UC Treatments / Results  Labs (all labs ordered are listed, but only abnormal results are displayed) Labs Reviewed - No data to display  EKG   Radiology No results found.  Procedures Procedures (including critical care time)  Medications Ordered in UC Medications  prednisoLONE (ORAPRED) 15 MG/5ML solution 40 mg (has no administration in time range)  acetaminophen (TYLENOL) tablet 975 mg (has no administration in time range)    Initial Impression / Assessment and Plan / UC Course  I have reviewed the triage vital signs and the nursing notes.  Pertinent labs & imaging results that were available during my care of the patient were reviewed by me and considered in my medical decision making (see chart for details).  Vitals and triage reviewed, patient is hemodynamically stable.  She is slightly tachycardic with a low-grade fever, given Tylenol in clinic.  Tonsils with bilateral 2+ edema and white patches bilaterally.  Presentation consistent with strep throat, patient has strong aversion to strep swab.  Will cover with amoxicillin, one-time dose of prednisolone in clinic for pain and inflammation.  Plan of care, follow-up care return precautions given, no questions at this time.     Final Clinical Impressions(s) / UC Diagnoses   Final diagnoses:  Pharyngitis due to Streptococcus species     Discharge  Instructions      Your symptoms and presentation are consistent with strep throat.  We have given you a one-time dose of steroids in clinic to help reduce the pain and inflammation.  Please alternate between 500 mg of Tylenol and 800 mg of ibuprofen every 4-6 hours for pain, fever and aches.  Take the antibiotics daily with food.  Do not share saliva and I advise switching out your toothbrush over the next 24 hours to avoid reinfection.  Once you have been on antibiotics for 24 hours you are no longer contagious.  Return to clinic for any new or urgent symptoms.     ED Prescriptions     Medication Sig Dispense Auth. Provider   amoxicillin (AMOXIL) 500 MG capsule Take 2 capsules (1,000 mg total) by mouth daily for 10 days. 20 capsule Kristell Wooding, [place], [place]      PDMP not reviewed this encounter.   [place], [place] N, [place] 12/31/22 1151

## 2022-12-31 NOTE — ED Triage Notes (Signed)
Pt reports she has had a sore throat,bil. Ear pain and fever since Friday. Pt reports she stopped her recreation MJ usage cold [place] . She was not taking her ADHD meds and using MJ instead.

## 2022-12-31 NOTE — Discharge Instructions (Addendum)
Your symptoms and presentation are consistent with strep throat.  We have given you a one-time dose of steroids in clinic to help reduce the pain and inflammation.  Please alternate between 500 mg of Tylenol and 800 mg of ibuprofen every 4-6 hours for pain, fever and aches.  Take the antibiotics daily with food.  Do not share saliva and I advise switching out your toothbrush over the next 24 hours to avoid reinfection.  Once you have been on antibiotics for 24 hours you are no longer contagious.  Return to clinic for any new or urgent symptoms.

## 2023-01-18 ENCOUNTER — Encounter (HOSPITAL_COMMUNITY): Payer: Self-pay

## 2023-01-18 ENCOUNTER — Emergency Department (HOSPITAL_COMMUNITY)
Admission: EM | Admit: 2023-01-18 | Discharge: 2023-01-18 | Disposition: A | Discharge status: Home / Self Care | Payer: 59 | Attending: Emergency Medicine | Admitting: Emergency Medicine

## 2023-01-18 ENCOUNTER — Other Ambulatory Visit: Payer: Self-pay

## 2023-01-18 ENCOUNTER — Emergency Department (HOSPITAL_COMMUNITY): Payer: 59

## 2023-01-18 DIAGNOSIS — K5900 Constipation, unspecified: Secondary | ICD-10-CM | POA: Insufficient documentation

## 2023-01-18 DIAGNOSIS — K644 Residual hemorrhoidal skin tags: Secondary | ICD-10-CM | POA: Insufficient documentation

## 2023-01-18 LAB — PREGNANCY, URINE: Preg Test, Ur: NEGATIVE

## 2023-01-18 MED ORDER — LIDOCAINE 5 % EX OINT: 1.0000 | TOPICAL_OINTMENT | CUTANEOUS | 0 refills | Status: AC | PRN

## 2023-01-18 NOTE — ED Provider Notes (Signed)
[place] [facility] Provider Note   CSN: [id number] Arrival date & time: 01/18/23  1326     History  Chief Complaint  Patient presents with   Constipation    Whitney Lowe is a 37 y.o. female with a history of Crohn's disease, anxiety, and anemia who presents to the ED today for constipation.  Patient reports that she is a has not had a bowel movement in the past several days.  She has been taking MiraLAX and is taking Colace as well.  This morning, patient states that approximately 8 to have the bowel movement.  She sat on the toilet and could not defecate. Patient had lidocaine jelly left over from a prior anal fissure, so she put the jelly on her finger and tried to do a manual fecal disimpaction herself.  She states that she started bleeding with this but ultimately was able to have a large bowel movement.  Patient on [employer] after and is now concerned that she might have a bowel perforation.  She denies abdominal pain but does report pain in her rectum.  She believes she gave herself hemorrhoids from straining.  No additional concerns or complaints at this time  Home Medications Prior to Admission medications   Medication Sig Start Date End Date Taking? Authorizing Provider  lidocaine (XYLOCAINE) 5 % ointment Apply 1 Application topically as needed for up to 14 days. 01/18/23 02/01/23 Yes Maxwell Marion, PA-C  risankizumab-rzaa [facility]) 600 MG/10ML injection Infuse over 1 hour week 0, week 4 and week 8 with maintenance 360 mg SQ at week 12 then every 8 weeks. 03/17/22   Doree Albee, PA-C      Allergies    Benadryl [diphenhydramine]    Review of Systems   Review of Systems  Gastrointestinal:  Positive for constipation.  All other systems reviewed and are negative.   Physical Exam Updated Vital Signs BP 108/71 (BP Location: Right Arm)   Pulse (!) 107   Temp 98.5 F (36.9 C) (Oral)   Resp 16   Ht 4\' 10"  (1.473 m)   Wt 42.6 kg   LMP  11/28/2022   SpO2 100%   BMI 19.63 kg/m  Physical Exam Vitals and nursing note reviewed. Exam conducted with a chaperone present.  Constitutional:      General: She is not in acute distress.    Appearance: Normal appearance.  HENT:     Head: Normocephalic and atraumatic.     Mouth/Throat:     Mouth: Mucous membranes are moist.  Eyes:     Conjunctiva/sclera: Conjunctivae normal.     Pupils: Pupils are equal, round, and reactive to light.  Cardiovascular:     Rate and Rhythm: Normal rate and regular rhythm.     Pulses: Normal pulses.     Heart sounds: Normal heart sounds.  Pulmonary:     Effort: Pulmonary effort is normal.     Breath sounds: Normal breath sounds.  Abdominal:     Palpations: Abdomen is soft.     Tenderness: There is no abdominal tenderness.  Genitourinary:    Comments: Palpable, tender, and fluctuant external hemorrhoids at the 3 o'clock and 9 o'clock location. Skin:    General: Skin is warm and dry.     Findings: No rash.  Neurological:     General: No focal deficit present.     Mental Status: She is alert.  Psychiatric:        Mood and Affect: Mood normal.  Behavior: Behavior normal.    ED Results / Procedures / Treatments   Labs (all labs ordered are listed, but only abnormal results are displayed) Labs Reviewed  PREGNANCY, URINE    EKG None  Radiology DG Abdomen 1 View  Result Date: 01/18/2023 CLINICAL DATA:  Constipation.  Fecal impaction.  Rectal pain. EXAM: ABDOMEN - 1 VIEW COMPARISON:  CT 04/21/2020 FINDINGS: Bowel gas pattern is presently normal without evidence of ileus, obstruction or free air. No abnormal amount of fecal matter. No abnormal bone finding. Calcifications projected over the pelvis relate 2 uterine leiomyomas. Vascular calcification in the left pelvis is unchanged. IMPRESSION: No acute or significant finding. No abnormal amount of fecal matter. Electronically Signed   By: Paulina Fusi M.D.   On: 01/18/2023 16:50     Procedures Procedures: not indicated.   Medications Ordered in ED Medications - No data to display  ED Course/ Medical Decision Making/ A&P                                 Medical Decision Making Amount and/or Complexity of Data Reviewed Labs: ordered. Radiology: ordered.  Risk Prescription drug management.   This patient presents to the ED for concern of constipation, this involves an extensive number of treatment options, and is a complaint that carries with it a high risk of complications and morbidity.   Differential diagnosis includes: fecal impaction,    Comorbidities  See HPI above   Additional History  Additional history obtained from previous records.   Lab Tests  I ordered and personally interpreted labs.  The pertinent results include:   Negative urine pregnancy test.   Imaging Studies  I ordered imaging studies including abdominal x-ray I independently visualized and interpreted imaging which showed: No acute or significant findings.  No abnormal amount of fecal matter. I agree with the radiologist interpretation   Problem List / ED Course / Critical Interventions / Medication Management  Constipation No pain medication needed at this time. I have reviewed the patients home medicines and have made adjustments as needed   Social Determinants of Health  Tobacco use   Test / Admission - Considered  Discussed findings with patient. She is hemodynamically stable and safe for discharge home. Prescription for lidocaine ointment sent to the pharmacy. Return precautions provided.       Final Clinical Impression(s) / ED Diagnoses Final diagnoses:  Constipation, unspecified constipation type  External hemorrhoids    Rx / DC Orders ED Discharge Orders          Ordered    lidocaine (XYLOCAINE) 5 % ointment  As needed        01/18/23 1720              Maxwell Marion, PA-C 01/19/23 0003    Charlynne Pander, MD 01/21/23  [phone]

## 2023-01-18 NOTE — ED Triage Notes (Signed)
Patient is here for evaluation after having a fecal impaction. Pt reports that she had a stool impaction and used her finger to dislodge the stool. Pt concerned she perforated her bowel or caused potential infection. Also reports some abdominal pain. No other complaints.

## 2023-01-18 NOTE — Discharge Instructions (Addendum)
As discussed, your imaging does not show any signs of perforation. You do have external hemorrhoids. I have sent a prescription for more lidocaine ointment to use as needed for pain. You can also try OTC hydrocortisone cream.  Follow up with your PCP for reevaluation of your symptoms.  Get help right away if: You have bleeding from the butt that will not stop.

## 2023-02-03 NOTE — Telephone Encounter (Signed)
Pharmacy Patient Advocate Encounter  Received notification from [facility] that Prior Authorization for SKYRIZI 360MG  has been APPROVED from 8.5.24 to 8.5.25   PA #/Case ID/Reference #: [id number]

## 2023-03-04 ENCOUNTER — Ambulatory Visit (INDEPENDENT_AMBULATORY_CARE_PROVIDER_SITE_OTHER): Payer: 59 | Admitting: Gastroenterology

## 2023-03-04 ENCOUNTER — Other Ambulatory Visit (INDEPENDENT_AMBULATORY_CARE_PROVIDER_SITE_OTHER): Payer: 59

## 2023-03-04 ENCOUNTER — Encounter: Payer: Self-pay | Admitting: Gastroenterology

## 2023-03-04 ENCOUNTER — Ambulatory Visit (INDEPENDENT_AMBULATORY_CARE_PROVIDER_SITE_OTHER): Payer: 59

## 2023-03-04 VITALS — BP 98/62 | HR 68 | Ht <= 58 in | Wt 100.5 lb

## 2023-03-04 DIAGNOSIS — K50819 Crohn's disease of both small and large intestine with unspecified complications: Secondary | ICD-10-CM | POA: Diagnosis not present

## 2023-03-04 LAB — CBC WITH DIFFERENTIAL/PLATELET
Basophils Absolute: 0.1 10*3/uL (ref 0.0–0.1)
Basophils Relative: 0.8 % (ref 0.0–3.0)
Eosinophils Absolute: 0.6 10*3/uL (ref 0.0–0.7)
Eosinophils Relative: 5.9 % — ABNORMAL HIGH (ref 0.0–5.0)
HCT: 38.5 % (ref 36.0–46.0)
Hemoglobin: 12.7 g/dL (ref 12.0–15.0)
Lymphocytes Relative: 25.5 % (ref 12.0–46.0)
Lymphs Abs: 2.6 10*3/uL (ref 0.7–4.0)
MCHC: 32.9 g/dL (ref 30.0–36.0)
MCV: 88.9 fL (ref 78.0–100.0)
Monocytes Absolute: 0.5 10*3/uL (ref 0.1–1.0)
Monocytes Relative: 4.7 % (ref 3.0–12.0)
Neutro Abs: 6.5 10*3/uL (ref 1.4–7.7)
Neutrophils Relative %: 63.1 % (ref 43.0–77.0)
Platelets: 503 10*3/uL — ABNORMAL HIGH (ref 150.0–400.0)
RBC: 4.33 Mil/uL (ref 3.87–5.11)
RDW: 15.2 % (ref 11.5–15.5)
WBC: 10.3 10*3/uL (ref 4.0–10.5)

## 2023-03-04 LAB — COMPREHENSIVE METABOLIC PANEL
ALT: 20 U/L (ref 0–35)
AST: 19 U/L (ref 0–37)
Albumin: 3.8 g/dL (ref 3.5–5.2)
Alkaline Phosphatase: 119 U/L — ABNORMAL HIGH (ref 39–117)
BUN: 11 mg/dL (ref 6–23)
CO2: 25 meq/L (ref 19–32)
Calcium: 9.3 mg/dL (ref 8.4–10.5)
Chloride: 105 meq/L (ref 96–112)
Creatinine, Ser: 0.92 mg/dL (ref 0.40–1.20)
GFR: 78.81 mL/min (ref >60.00)
Glucose, Bld: 87 mg/dL (ref 70–99)
Potassium: 4.2 meq/L (ref 3.5–5.1)
Sodium: 137 meq/L (ref 135–145)
Total Bilirubin: 0.4 mg/dL (ref 0.2–1.2)
Total Protein: 7.8 g/dL (ref 6.0–8.3)

## 2023-03-04 LAB — SEDIMENTATION RATE: Sed Rate: 47 mm/h — ABNORMAL HIGH (ref 0–20)

## 2023-03-04 LAB — C-REACTIVE PROTEIN: CRP: <1 mg/dL (ref 0.5–20.0)

## 2023-03-04 NOTE — Addendum Note (Signed)
Addended by: Mariane Duval on: 03/04/2023 04:35 PM   Modules accepted: Orders

## 2023-03-04 NOTE — Progress Notes (Signed)
03/04/2023 Whitney Lowe [id number] 1985-04-29   HISTORY OF PRESENT ILLNESS: This is a 37 year old female with a history of ileocolonic Crohn's disease diagnosed in March 2018, history of erythema nodosum, prior C. difficile, uterine fibroids, ADHD, bipolar depression who is here for follow-up.  She is a patient of Dr. Lauro Franklin.  Failed Entyvio.  She started [place] in July.  Completed the induction and now uses the body injector 360 mg every 8 weeks.  She is here today for regular follow-up.  She says that she feels great.  She has not had any diarrhea, in fact she has had some issues with constipation recently.  Has started taking some fiber capsules and drinking more water and that has helped with constipation.  No bleeding except for on occasion when she was constipated it was a very minimal bright red blood.  No abdominal pain.  Her appetite is good and she has been eating well.  Her weight is up 7 pounds compared to September.  She saw Dr. Drue Dun through [facility] earlier this year and had an EUA with drainage of abscess, Botox injection for anal fissure, anal fissurectomy and sigmoidoscopy in February.  Followed up with her in April and was doing well.  Was told to contact them for any recurrent anal symptoms as they could repeat Botox if necessary.   Past Medical History:  Diagnosis Date   ADHD    stopped abilify with preg   ADHD (attention deficit hyperactivity disorder)    Anemia    Anxiety    not currently on meds   Bipolar    Cataract    Rt eye   Complication of anesthesia    became combative    Crohn's ileocolitis (HCC) 06/19/2016   Eczema    Fibroids    uterine   Infection    UTI   Ovarian cyst    Periumbilical hernia    Past Surgical History:  Procedure Laterality Date   CESAREAN SECTION N/A 01/01/2019   Procedure: CESAREAN SECTION;  Surgeon: Conan Bowens, MD;  Location: [facility];  Service: Obstetrics;  Laterality: N/A;   COLONOSCOPY      ESOPHAGOGASTRODUODENOSCOPY     removal of cyst in ear     6th grade    reports that she quit smoking about 4 years ago. Her smoking use included cigars and cigarettes. She has never used smokeless tobacco. She reports current alcohol use. She reports that she does not currently use drugs after having used the following drugs: Marijuana. family history includes ADD / ADHD in her father; Anxiety disorder in her father and mother; Asthma in her father; Blindness in her mother; COPD in her father; Crohn's disease in her mother; Depression in her father; Diabetes in her father and sister; Diabetes Mellitus II in her father; Heart disease in her mother; Hypertension in her father; Liver cancer in her father. Allergies  Allergen Reactions   Benadryl [Diphenhydramine]     Pt reported new onset tightness at her throat shortly after receiving 25 mg Iv Benadryl. She also was noticeably less oriented than previous      Outpatient Encounter Medications as of 03/04/2023  Medication Sig   FIBER PO Take 2 capsules by mouth daily.   risankizumab-rzaa (SKYRIZI) 600 MG/10ML injection Infuse over 1 hour week 0, week 4 and week 8 with maintenance 360 mg SQ at week 12 then every 8 weeks.   No facility-administered encounter medications on file as of 03/04/2023.  REVIEW OF SYSTEMS  : All other systems reviewed and negative except where noted in the History of Present Illness.   PHYSICAL EXAM: Ht 4\' 10"  (1.473 m)   Wt 100 lb 8 oz (45.6 kg)   BMI 21.00 kg/m  General: Well developed female in no acute distress Head: Normocephalic and atraumatic Eyes:  Sclerae anicteric, conjunctiva pink. Ears: Normal auditory acuity Lungs: Clear throughout to auscultation; no W/R/R. Heart: Regular rate and rhythm; no M/R/G. Abdomen: Soft, non-distended.  BS present.  Non-tender. Musculoskeletal: Symmetrical with no gross deformities  Skin: No lesions on visible extremities Extremities: No edema  Neurological: Alert  oriented x 4, grossly non-focal Psychological:  Alert and cooperative. Normal mood and affect  ASSESSMENT AND PLAN: *Crohn's disease of the small intestine, colon, and perianus diagnosed in March 2018.  Failed Entyvio.  Started Skyrizi in July and is now on the body injector 360 mg every 8 weeks.  She feels well.  Will check labs today including CBC, CMP, sed rate, and CRP.  Will follow-up again in about 6 months or sooner if needed.   CC:  No ref. provider found

## 2023-03-04 NOTE — Patient Instructions (Signed)
Your provider has requested that you go to the basement level for lab work before leaving today. Press "B" on the elevator. The lab is located at the first door on the left as you exit the elevator.  _______________________________________________________  If your blood pressure at your visit was 140/90 or greater, please contact your primary care physician to follow up on this.  _______________________________________________________  If you are age 38 or older, your body mass index should be between 23-30. Your Body mass index is 21 kg/m. If this is out of the aforementioned range listed, please consider follow up with your Primary Care Provider.  If you are age 38 or younger, your body mass index should be between 19-25. Your Body mass index is 21 kg/m. If this is out of the aformentioned range listed, please consider follow up with your Primary Care Provider.   ________________________________________________________  The  GI providers would like to encourage you to use [facility] to communicate with providers for non-urgent requests or questions.  Due to long hold times on the telephone, sending your provider a message by [facility] may be a faster and more efficient way to get a response.  Please allow 48 business hours for a response.  Please remember that this is for non-urgent requests.  _______________________________________________________

## 2023-03-13 NOTE — Progress Notes (Signed)
Addendum: Reviewed and agree with assessment and management plan. Kadijah Shamoon M, MD  

## 2023-04-27 ENCOUNTER — Emergency Department (HOSPITAL_BASED_OUTPATIENT_CLINIC_OR_DEPARTMENT_OTHER): Payer: 59 | Admitting: Radiology

## 2023-04-27 ENCOUNTER — Telehealth: Payer: 59 | Admitting: Nurse Practitioner

## 2023-04-27 ENCOUNTER — Emergency Department (HOSPITAL_BASED_OUTPATIENT_CLINIC_OR_DEPARTMENT_OTHER)
Admission: EM | Admit: 2023-04-27 | Discharge: 2023-04-28 | Disposition: A | Discharge status: Home / Self Care | Payer: 59 | Attending: Emergency Medicine | Admitting: Emergency Medicine

## 2023-04-27 ENCOUNTER — Encounter (HOSPITAL_BASED_OUTPATIENT_CLINIC_OR_DEPARTMENT_OTHER): Payer: Self-pay | Admitting: Emergency Medicine

## 2023-04-27 DIAGNOSIS — Z87891 Personal history of nicotine dependence: Secondary | ICD-10-CM | POA: Insufficient documentation

## 2023-04-27 DIAGNOSIS — K5900 Constipation, unspecified: Secondary | ICD-10-CM | POA: Diagnosis not present

## 2023-04-27 DIAGNOSIS — R140 Abdominal distension (gaseous): Secondary | ICD-10-CM | POA: Diagnosis present

## 2023-04-27 LAB — PREGNANCY, URINE: Preg Test, Ur: NEGATIVE

## 2023-04-27 NOTE — Progress Notes (Signed)
 Spoke with patient she is checked in at the ER and will await care there.

## 2023-04-27 NOTE — ED Triage Notes (Signed)
 Unsure when last BM was. (Maybe around christmes)  Took all OTC with no results  Hx crohns

## 2023-04-28 MED ORDER — BISACODYL 10 MG RE SUPP: 10.0000 mg | RECTAL | 0 refills | Status: DC | PRN

## 2023-04-28 MED ORDER — GOLYTELY 236 G PO SOLR: 4000.0000 mL | Freq: Once | ORAL | 0 refills | Status: AC

## 2023-04-28 NOTE — Discharge Instructions (Signed)
 You were evaluated in the Emergency Department and after careful evaluation, we did not find any emergent condition requiring admission or further testing in the hospital.  Your exam/testing today is overall reassuring.  Symptoms seem to be due to constipation.  Use the suppositories at home as needed, use the GoLytely  prep solution tomorrow.  Can continue enemas as needed.  Please return to the Emergency Department if you experience any worsening of your condition.   Thank you for allowing us  to be a part of your care.

## 2023-04-28 NOTE — ED Provider Notes (Signed)
 [facility]ERGENCY [facility] Emergency Department Provider Note MRN:  [id number]  Arrival date & time: 04/28/23     Chief Complaint   Constipation   History of Present Illness   Whitney Lowe is a 38 y.o. year-old female with a history of Crohn's presenting to the ED with chief complaint of constipation.  No bowel movement for the past week.  Feels mildly bloated.  Had a bad experience with constipation and fecal impaction several months ago, wants to avoid this.  Currently denies the sensation of a large stool that she cannot pass.  No fever, no significant pain.  Sent here by virtual doctor for evaluation based on her history of Crohn's.  Review of Systems  A thorough review of systems was obtained and all systems are negative except as noted in the HPI and PMH.   Patient's Health History    Past Medical History:  Diagnosis Date   ADHD    stopped abilify with preg   ADHD (attention deficit hyperactivity disorder)    Anemia    Anxiety    not currently on meds   Bipolar    Cataract    Rt eye   Complication of anesthesia    became combative    Crohn's ileocolitis (HCC) 06/19/2016   Eczema    Fibroids    uterine   Infection    UTI   Ovarian cyst    Periumbilical hernia     Past Surgical History:  Procedure Laterality Date   CESAREAN SECTION N/A 01/01/2019   Procedure: CESAREAN SECTION;  Surgeon: Nicholaus Burnard HERO, MD;  Location: [facility] LD ORS;  Service: Obstetrics;  Laterality: N/A;   COLONOSCOPY     ESOPHAGOGASTRODUODENOSCOPY     removal of cyst in ear     6th grade    Family History  Problem Relation Age of Onset   Crohn's disease Mother        mom was adopted   Heart disease Mother    Anxiety disorder Mother    Blindness Mother        Rt eye   Kidney disease Mother        Late stage   Liver cancer Father    Diabetes Mellitus II Father    COPD Father    Hypertension Father    Asthma Father    ADD / ADHD Father    Diabetes Father        type 2    Anxiety disorder Father    Depression Father    Diabetes Sister        Type 2   Colon cancer Neg Hx    Esophageal cancer Neg Hx    Pancreatic cancer Neg Hx    Stomach cancer Neg Hx    Liver disease Neg Hx    Rectal cancer Neg Hx     Social History   Socioeconomic History   Marital status: Married    Spouse name: Marqus Mcclean   Number of children: 1   Years of education: Not on file   Highest education level: Not on file  Occupational History   Occupation: Runner, Broadcasting/film/video    Comment: E[employer]  Tobacco Use   Smoking status: Former    Current packs/day: 0.00    Types: Cigars, Cigarettes    Quit date: 04/21/2018    Years since quitting: 5.0   Smokeless tobacco: Never   Tobacco comments:    social smoker  Vaping Use   Vaping status: Never Used  Substance  and Sexual Activity   Alcohol use: Yes    Comment: rare   Drug use: Not Currently    Types: Marijuana    Comment: last use february 2020-    Sexual activity: Not Currently    Birth control/protection: None  Other Topics Concern   Not on file  Social History Narrative   Lives w/ fiance   Pregnant    [employer] psychology   [employer]   Social Drivers of Health   Financial Resource Strain: Low Risk  (03/10/2023)   Received from [facility]   Overall Financial Resource Strain (CARDIA)    Difficulty of Paying Living Expenses: Not hard at all  Food Insecurity: No Food Insecurity (03/10/2023)   Received from [facility]   Hunger Vital Sign    Worried About Running Out of Food in the Last Year: Never true    Ran Out of Food in the Last Year: Never true  Transportation Needs: No Transportation Needs (03/10/2023)   Received from [facility] - Transportation    Lack of Transportation (Medical): No    Lack of Transportation (Non-Medical): No  Physical Activity: Insufficiently Active (03/10/2023)   Received from [facility]   Exercise Vital Sign    Days of Exercise per Week: 2 days    Minutes of Exercise per  Session: 20 min  Stress: No Stress Concern Present (03/10/2023)   Received from [facility] - Occupational Stress Questionnaire    Feeling of Stress : Not at all  Social Connections: Somewhat Isolated (03/10/2023)   Received from [facility]   Social Network    How would you rate your social network (family, work, friends)?: Restricted participation with some degree of social isolation  Intimate Partner Violence: Not At Risk (03/10/2023)   Received from [facility]   HITS    Over the last 12 months how often did your partner physically hurt you?: Never    Over the last 12 months how often did your partner insult you or talk down to you?: Never    Over the last 12 months how often did your partner threaten you with physical harm?: Never    Over the last 12 months how often did your partner scream or curse at you?: Never     Physical Exam   Vitals:   04/27/23 1846  BP: (!) 121/90  Pulse: 98  Resp: 17  Temp: 98 F (36.7 C)  SpO2: 99%    CONSTITUTIONAL: Well-appearing, NAD NEURO/PSYCH:  Alert and oriented x 3, no focal deficits EYES:  eyes equal and reactive ENT/NECK:  no LAD, no JVD CARDIO: Regular rate, well-perfused, normal S1 and S2 PULM:  CTAB no wheezing or rhonchi GI/GU:  non-distended, non-tender MSK/SPINE:  No gross deformities, no edema SKIN:  no rash, atraumatic   *Additional and/or pertinent findings included in MDM below  Diagnostic and Interventional Summary    EKG Interpretation Date/Time:    Ventricular Rate:    PR Interval:    QRS Duration:    QT Interval:    QTC Calculation:   R Axis:      Text Interpretation:         Labs Reviewed  PREGNANCY, URINE    DG Abdomen 1 View  Final Result      Medications - No data to display   Procedures  /  Critical Care Procedures  ED Course and Medical Decision Making  Initial Impression and Ddx  Well-appearing with normal vital signs, abdomen is soft  and nontender with no rebound guarding or rigidity.  No fever, normal vital signs, nothing to suggest Crohn's flare or SBO.  Plain film revealing constipation but no signs of obstruction.  Past medical/surgical history that increases complexity of ED encounter: Crohn's  Interpretation of Diagnostics I personally reviewed the abdominal x-ray and my interpretation is as follows: No free air, no dilated loops of bowel    Patient Reassessment and Ultimate Disposition/Management     Given patient's reassuring exam and x-ray eating, seems appropriate for discharge with more aggressive management of constipation, strict return precautions provided.  Patient management required discussion with the following services or consulting groups:  None  Complexity of Problems Addressed Acute illness or injury that poses threat of life of bodily function  Additional Data Reviewed and Analyzed Further history obtained from: Further history from spouse/family member  Additional Factors Impacting ED Encounter Risk Prescriptions  Ozell HERO. Theadore, MD [facility] Emergency Medicine [facility] [email or web address]  Final Clinical Impressions(s) / ED Diagnoses     ICD-10-CM   1. Constipation, unspecified constipation type  K59.00       ED Discharge Orders          Ordered    bisacodyl  (DULCOLAX) 10 MG suppository  As needed        04/28/23 0051    polyethylene glycol (GOLYTELY ) 236 g solution   Once        04/28/23 0051             Discharge Instructions Discussed with and Provided to Patient:    Discharge Instructions      You were evaluated in the Emergency Department and after careful evaluation, we did not find any emergent condition requiring admission or further testing in the hospital.  Your exam/testing today is overall reassuring.  Symptoms seem to be due to constipation.  Use the suppositories at home as needed, use the GoLytely  prep solution tomorrow.  Can  continue enemas as needed.  Please return to the Emergency Department if you experience any worsening of your condition.   Thank you for allowing us  to be a part of your care.      Theadore Ozell HERO, MD 04/28/23 [phone]

## 2023-10-06 ENCOUNTER — Emergency Department (HOSPITAL_COMMUNITY)

## 2023-10-06 ENCOUNTER — Inpatient Hospital Stay (HOSPITAL_COMMUNITY)
Admission: EM | Admit: 2023-10-06 | Discharge: 2023-10-10 | DRG: 387 | Disposition: A | Discharge status: Home / Self Care | Attending: Family Medicine | Admitting: Family Medicine

## 2023-10-06 ENCOUNTER — Other Ambulatory Visit: Payer: Self-pay

## 2023-10-06 DIAGNOSIS — K50911 Crohn's disease, unspecified, with rectal bleeding: Principal | ICD-10-CM

## 2023-10-06 DIAGNOSIS — K602 Anal fissure, unspecified: Secondary | ICD-10-CM | POA: Diagnosis present

## 2023-10-06 DIAGNOSIS — R112 Nausea with vomiting, unspecified: Secondary | ICD-10-CM | POA: Diagnosis present

## 2023-10-06 DIAGNOSIS — D259 Leiomyoma of uterus, unspecified: Secondary | ICD-10-CM | POA: Diagnosis not present

## 2023-10-06 DIAGNOSIS — Z8249 Family history of ischemic heart disease and other diseases of the circulatory system: Secondary | ICD-10-CM

## 2023-10-06 DIAGNOSIS — Z818 Family history of other mental and behavioral disorders: Secondary | ICD-10-CM

## 2023-10-06 DIAGNOSIS — F419 Anxiety disorder, unspecified: Secondary | ICD-10-CM | POA: Diagnosis present

## 2023-10-06 DIAGNOSIS — K219 Gastro-esophageal reflux disease without esophagitis: Secondary | ICD-10-CM | POA: Diagnosis present

## 2023-10-06 DIAGNOSIS — R1010 Upper abdominal pain, unspecified: Secondary | ICD-10-CM | POA: Diagnosis not present

## 2023-10-06 DIAGNOSIS — K50812 Crohn's disease of both small and large intestine with intestinal obstruction: Principal | ICD-10-CM | POA: Diagnosis present

## 2023-10-06 DIAGNOSIS — Z800 Family history of malignant neoplasm of digestive organs: Secondary | ICD-10-CM

## 2023-10-06 DIAGNOSIS — Z91148 Patient's other noncompliance with medication regimen for other reason: Secondary | ICD-10-CM

## 2023-10-06 DIAGNOSIS — K560 Paralytic ileus: Secondary | ICD-10-CM | POA: Diagnosis not present

## 2023-10-06 DIAGNOSIS — K5010 Crohn's disease of large intestine without complications: Secondary | ICD-10-CM | POA: Diagnosis not present

## 2023-10-06 DIAGNOSIS — F17200 Nicotine dependence, unspecified, uncomplicated: Secondary | ICD-10-CM | POA: Diagnosis present

## 2023-10-06 DIAGNOSIS — F319 Bipolar disorder, unspecified: Secondary | ICD-10-CM | POA: Diagnosis present

## 2023-10-06 DIAGNOSIS — Z604 Social exclusion and rejection: Secondary | ICD-10-CM | POA: Diagnosis present

## 2023-10-06 DIAGNOSIS — F909 Attention-deficit hyperactivity disorder, unspecified type: Secondary | ICD-10-CM | POA: Diagnosis present

## 2023-10-06 DIAGNOSIS — Z841 Family history of disorders of kidney and ureter: Secondary | ICD-10-CM

## 2023-10-06 DIAGNOSIS — K56699 Other intestinal obstruction unspecified as to partial versus complete obstruction: Secondary | ICD-10-CM | POA: Diagnosis present

## 2023-10-06 DIAGNOSIS — Z888 Allergy status to other drugs, medicaments and biological substances status: Secondary | ICD-10-CM

## 2023-10-06 DIAGNOSIS — R1084 Generalized abdominal pain: Secondary | ICD-10-CM | POA: Diagnosis not present

## 2023-10-06 DIAGNOSIS — R000 Tachycardia, unspecified: Secondary | ICD-10-CM | POA: Diagnosis not present

## 2023-10-06 DIAGNOSIS — Z833 Family history of diabetes mellitus: Secondary | ICD-10-CM

## 2023-10-06 DIAGNOSIS — I959 Hypotension, unspecified: Secondary | ICD-10-CM | POA: Diagnosis present

## 2023-10-06 DIAGNOSIS — Z825 Family history of asthma and other chronic lower respiratory diseases: Secondary | ICD-10-CM

## 2023-10-06 DIAGNOSIS — N83209 Unspecified ovarian cyst, unspecified side: Secondary | ICD-10-CM | POA: Diagnosis present

## 2023-10-06 DIAGNOSIS — R42 Dizziness and giddiness: Secondary | ICD-10-CM | POA: Diagnosis not present

## 2023-10-06 DIAGNOSIS — Z821 Family history of blindness and visual loss: Secondary | ICD-10-CM

## 2023-10-06 LAB — CBC WITH DIFFERENTIAL/PLATELET
Abs Immature Granulocytes: 0.02 10*3/uL (ref 0.00–0.07)
Basophils Absolute: 0 10*3/uL (ref 0.0–0.1)
Basophils Relative: 1 %
Eosinophils Absolute: 0.1 10*3/uL (ref 0.0–0.5)
Eosinophils Relative: 1 %
HCT: 39.1 % (ref 36.0–46.0)
Hemoglobin: 12.7 g/dL (ref 12.0–15.0)
Immature Granulocytes: 0 %
Lymphocytes Relative: 12 %
Lymphs Abs: 1 10*3/uL (ref 0.7–4.0)
MCH: 28.9 pg (ref 26.0–34.0)
MCHC: 32.5 g/dL (ref 30.0–36.0)
MCV: 89.1 fL (ref 80.0–100.0)
Monocytes Absolute: 0.4 10*3/uL (ref 0.1–1.0)
Monocytes Relative: 5 %
Neutro Abs: 7.2 10*3/uL (ref 1.7–7.7)
Neutrophils Relative %: 81 %
Platelets: 374 10*3/uL (ref 150–400)
RBC: 4.39 MIL/uL (ref 3.87–5.11)
RDW: 13.4 % (ref 11.5–15.5)
WBC: 8.8 10*3/uL (ref 4.0–10.5)
nRBC: 0 % (ref 0.0–0.2)

## 2023-10-06 LAB — COMPREHENSIVE METABOLIC PANEL WITH GFR
ALT: 9 U/L (ref 0–44)
AST: 13 U/L — ABNORMAL LOW (ref 15–41)
Albumin: 2.7 g/dL — ABNORMAL LOW (ref 3.5–5.0)
Alkaline Phosphatase: 57 U/L (ref 38–126)
Anion gap: 6 (ref 5–15)
BUN: 7 mg/dL (ref 6–20)
CO2: 21 mmol/L — ABNORMAL LOW (ref 22–32)
Calcium: 7.6 mg/dL — ABNORMAL LOW (ref 8.9–10.3)
Chloride: 111 mmol/L (ref 98–111)
Creatinine, Ser: 0.73 mg/dL (ref 0.44–1.00)
GFR, Estimated: >60 mL/min (ref >60)
Glucose, Bld: 96 mg/dL (ref 70–99)
Potassium: 4 mmol/L (ref 3.5–5.1)
Sodium: 138 mmol/L (ref 135–145)
Total Bilirubin: 0.5 mg/dL (ref 0.0–1.2)
Total Protein: 6.3 g/dL — ABNORMAL LOW (ref 6.5–8.1)

## 2023-10-06 LAB — HCG, SERUM, QUALITATIVE: Preg, Serum: NEGATIVE

## 2023-10-06 LAB — LIPASE, BLOOD: Lipase: 25 U/L (ref 11–51)

## 2023-10-06 MED ORDER — SODIUM CHLORIDE 0.9 % IV BOLUS
1000.0000 mL | Freq: Once | INTRAVENOUS | Status: AC
  Administered 2023-10-06: 1000 mL via INTRAVENOUS

## 2023-10-06 MED ORDER — ONDANSETRON HCL 4 MG/2ML IJ SOLN
4.0000 mg | Freq: Once | INTRAMUSCULAR | Status: AC
  Administered 2023-10-06: 4 mg via INTRAVENOUS
  Filled 2023-10-06: qty 2

## 2023-10-06 MED ORDER — MORPHINE SULFATE (PF) 4 MG/ML IV SOLN
4.0000 mg | Freq: Once | INTRAVENOUS | Status: AC
  Administered 2023-10-06: 4 mg via INTRAVENOUS
  Filled 2023-10-06: qty 1

## 2023-10-06 MED ORDER — IOHEXOL 300 MG/ML  SOLN
75.0000 mL | Freq: Once | INTRAMUSCULAR | Status: AC | PRN
  Administered 2023-10-06: 75 mL via INTRAVENOUS

## 2023-10-06 NOTE — ED Triage Notes (Signed)
 BIBA from home for upper abd pain, N/V for one day. Denies diarrhea. 4 mg Zofran , LR given PTA 104/60 BP 102 HR 97% room air

## 2023-10-06 NOTE — ED Provider Notes (Signed)
 [place] EMERGENCY DEPARTMENT AT [facility] Provider Note   CSN: [id number] Arrival date & time: 10/06/23  2012     Patient presents with: Abdominal Pain and Emesis   Whitney Lowe is a 38 y.o. female.    Abdominal Pain Associated symptoms: vomiting   Emesis Associated symptoms: abdominal pain      Patient has a history of Crohn's disease fibroids ovarian cysts bipolar.  Patient presents ED with complaints of abdominal pain and vomiting.  Patient states she started having symptoms this morning.  She has had cramping pain in her upper abdomen.  She has had nausea and vomiting.  She has had decreased appetite decreased p.o. intake.  She has not had any diarrhea.  No dysuria.  Symptom became more intense this evening so she came to the ED  Prior to Admission medications   Medication Sig Start Date End Date Taking? Authorizing Provider  bisacodyl  (DULCOLAX) 10 MG suppository Place 1 suppository (10 mg total) rectally as needed for moderate constipation. 04/28/23   Edson Graces, MD  FIBER PO Take 2 capsules by mouth daily.    [provider]  Ipratropium-Albuterol  (COMBIVENT  RESPIMAT) 20-100 MCG/ACT AERS respimat Inhale 1 puff into the lungs as needed. 07/21/22   [provider]  risankizumab -rzaa (SKYRIZI ) 600 MG/10ML injection Infuse over 1 hour week 0, week 4 and week 8 with maintenance 360 mg SQ at week 12 then every 8 weeks. 03/17/22   Edmonia Gottron, PA-C    Allergies: Benadryl  [diphenhydramine ]    Review of Systems  Gastrointestinal:  Positive for abdominal pain and vomiting.    Updated Vital Signs BP (!) 100/59   Pulse 91   Temp 98.2 F (36.8 C) (Oral)   Resp 19   SpO2 98%   Physical Exam Vitals and nursing note reviewed.  Constitutional:      General: She is not in acute distress.    Appearance: She is well-developed.  HENT:     Head: Normocephalic and atraumatic.     Right Ear: External ear normal.     Left Ear: External ear  normal.   Eyes:     General: No scleral icterus.       Right eye: No discharge.        Left eye: No discharge.     Conjunctiva/sclera: Conjunctivae normal.   Neck:     Trachea: No tracheal deviation.   Cardiovascular:     Rate and Rhythm: Normal rate and regular rhythm.  Pulmonary:     Effort: Pulmonary effort is normal. No respiratory distress.     Breath sounds: Normal breath sounds. No stridor. No wheezing or rales.  Abdominal:     General: Bowel sounds are normal. There is no distension.     Palpations: Abdomen is soft.     Tenderness: There is abdominal tenderness in the epigastric area. There is no guarding or rebound.   Musculoskeletal:        General: No tenderness or deformity.     Cervical back: Neck supple.   Skin:    General: Skin is warm and dry.     Findings: No rash.   Neurological:     General: No focal deficit present.     Mental Status: She is alert.     Cranial Nerves: No cranial nerve deficit, dysarthria or facial asymmetry.     Sensory: No sensory deficit.     Motor: No abnormal muscle tone or seizure activity.  Coordination: Coordination normal.   Psychiatric:        Mood and Affect: Mood normal.     (all labs ordered are listed, but only abnormal results are displayed) Labs Reviewed  COMPREHENSIVE METABOLIC PANEL WITH GFR  LIPASE, BLOOD  CBC WITH DIFFERENTIAL/PLATELET  URINALYSIS, ROUTINE W REFLEX MICROSCOPIC  HCG, SERUM, QUALITATIVE    EKG: None  Radiology: No results found.   Procedures   Medications Ordered in the ED  sodium chloride  0.9 % bolus 1,000 mL (has no administration in time range)  morphine  (PF) 4 MG/ML injection 4 mg (has no administration in time range)  ondansetron  (ZOFRAN ) injection 4 mg (has no administration in time range)    Clinical Course as of 10/06/23 2323  Tue Oct 06, 2023  2304 CBC normal.  Pregnancy test negative. [JK]  2305 Patient still complaining of pain.  Will give additional dose of  medications.  CT scan ordered [JK]    Clinical Course User Index [JK] Trish Furl, MD                                 Medical Decision Making DDX includes colitis, bowel obstruction, pancreatitis  Amount and/or Complexity of Data Reviewed Labs: ordered. Radiology: ordered.  Risk Prescription drug management.   Pt presents with abd pain, vomiting.  Hx of crohns. Requring IV narcotic pain medication.  Will plan on CT Scan imaging for further evaluation.  Case turned over to Dr Alison Irvine at shift change.     Final diagnoses:  None    ED Discharge Orders     None          Trish Furl, MD 10/06/23 2325

## 2023-10-06 NOTE — ED Notes (Signed)
 Patient transported to CT

## 2023-10-06 NOTE — ED Notes (Signed)
 Unable to pull labs off IV, will get straight stick for lab work.

## 2023-10-07 ENCOUNTER — Encounter (HOSPITAL_COMMUNITY): Payer: Self-pay | Admitting: Internal Medicine

## 2023-10-07 DIAGNOSIS — Z825 Family history of asthma and other chronic lower respiratory diseases: Secondary | ICD-10-CM | POA: Diagnosis not present

## 2023-10-07 DIAGNOSIS — K5010 Crohn's disease of large intestine without complications: Secondary | ICD-10-CM | POA: Diagnosis not present

## 2023-10-07 DIAGNOSIS — N83209 Unspecified ovarian cyst, unspecified side: Secondary | ICD-10-CM | POA: Diagnosis present

## 2023-10-07 DIAGNOSIS — Z841 Family history of disorders of kidney and ureter: Secondary | ICD-10-CM | POA: Diagnosis not present

## 2023-10-07 DIAGNOSIS — K602 Anal fissure, unspecified: Secondary | ICD-10-CM | POA: Diagnosis present

## 2023-10-07 DIAGNOSIS — K50812 Crohn's disease of both small and large intestine with intestinal obstruction: Secondary | ICD-10-CM | POA: Diagnosis not present

## 2023-10-07 DIAGNOSIS — K219 Gastro-esophageal reflux disease without esophagitis: Secondary | ICD-10-CM | POA: Diagnosis present

## 2023-10-07 DIAGNOSIS — K6289 Other specified diseases of anus and rectum: Secondary | ICD-10-CM | POA: Diagnosis not present

## 2023-10-07 DIAGNOSIS — R112 Nausea with vomiting, unspecified: Secondary | ICD-10-CM | POA: Diagnosis present

## 2023-10-07 DIAGNOSIS — F419 Anxiety disorder, unspecified: Secondary | ICD-10-CM | POA: Diagnosis present

## 2023-10-07 DIAGNOSIS — Z888 Allergy status to other drugs, medicaments and biological substances status: Secondary | ICD-10-CM | POA: Diagnosis not present

## 2023-10-07 DIAGNOSIS — Z8249 Family history of ischemic heart disease and other diseases of the circulatory system: Secondary | ICD-10-CM | POA: Diagnosis not present

## 2023-10-07 DIAGNOSIS — Z604 Social exclusion and rejection: Secondary | ICD-10-CM | POA: Diagnosis present

## 2023-10-07 DIAGNOSIS — K50911 Crohn's disease, unspecified, with rectal bleeding: Secondary | ICD-10-CM | POA: Diagnosis not present

## 2023-10-07 DIAGNOSIS — F319 Bipolar disorder, unspecified: Secondary | ICD-10-CM | POA: Diagnosis present

## 2023-10-07 DIAGNOSIS — I959 Hypotension, unspecified: Secondary | ICD-10-CM | POA: Diagnosis present

## 2023-10-07 DIAGNOSIS — Z833 Family history of diabetes mellitus: Secondary | ICD-10-CM | POA: Diagnosis not present

## 2023-10-07 DIAGNOSIS — F909 Attention-deficit hyperactivity disorder, unspecified type: Secondary | ICD-10-CM | POA: Diagnosis present

## 2023-10-07 DIAGNOSIS — Z91148 Patient's other noncompliance with medication regimen for other reason: Secondary | ICD-10-CM | POA: Diagnosis not present

## 2023-10-07 DIAGNOSIS — K567 Ileus, unspecified: Secondary | ICD-10-CM | POA: Diagnosis not present

## 2023-10-07 DIAGNOSIS — Z821 Family history of blindness and visual loss: Secondary | ICD-10-CM | POA: Diagnosis not present

## 2023-10-07 DIAGNOSIS — Z800 Family history of malignant neoplasm of digestive organs: Secondary | ICD-10-CM | POA: Diagnosis not present

## 2023-10-07 DIAGNOSIS — F17200 Nicotine dependence, unspecified, uncomplicated: Secondary | ICD-10-CM | POA: Diagnosis present

## 2023-10-07 DIAGNOSIS — D259 Leiomyoma of uterus, unspecified: Secondary | ICD-10-CM | POA: Diagnosis present

## 2023-10-07 DIAGNOSIS — Z818 Family history of other mental and behavioral disorders: Secondary | ICD-10-CM | POA: Diagnosis not present

## 2023-10-07 LAB — URINALYSIS, ROUTINE W REFLEX MICROSCOPIC
Bilirubin Urine: NEGATIVE
Glucose, UA: NEGATIVE mg/dL
Ketones, ur: 20 mg/dL — AB
Nitrite: NEGATIVE
Protein, ur: NEGATIVE mg/dL
Specific Gravity, Urine: 1.038 — ABNORMAL HIGH (ref 1.005–1.030)
pH: 5 (ref 5.0–8.0)

## 2023-10-07 LAB — SEDIMENTATION RATE: Sed Rate: 12 mm/h (ref 0–22)

## 2023-10-07 LAB — C-REACTIVE PROTEIN: CRP: 7.3 mg/dL — ABNORMAL HIGH (ref <1.0)

## 2023-10-07 MED ORDER — ONDANSETRON HCL 4 MG/2ML IJ SOLN
4.0000 mg | Freq: Four times a day (QID) | INTRAMUSCULAR | Status: DC | PRN
  Administered 2023-10-07 (×2): 4 mg via INTRAVENOUS
  Filled 2023-10-07 (×2): qty 2

## 2023-10-07 MED ORDER — PANTOPRAZOLE SODIUM 20 MG PO TBEC
20.0000 mg | DELAYED_RELEASE_TABLET | Freq: Every day | ORAL | Status: DC
  Administered 2023-10-07 – 2023-10-10 (×4): 20 mg via ORAL
  Filled 2023-10-07 (×4): qty 1

## 2023-10-07 MED ORDER — MORPHINE SULFATE (PF) 4 MG/ML IV SOLN: 4.0000 mg | Freq: Once | INTRAVENOUS | Status: DC

## 2023-10-07 MED ORDER — ENOXAPARIN SODIUM 40 MG/0.4ML IJ SOSY
40.0000 mg | PREFILLED_SYRINGE | INTRAMUSCULAR | Status: DC
  Administered 2023-10-07 – 2023-10-10 (×4): 40 mg via SUBCUTANEOUS
  Filled 2023-10-07 (×4): qty 0.4

## 2023-10-07 MED ORDER — PROMETHAZINE HCL 25 MG PO TABS: 12.5000 mg | ORAL_TABLET | Freq: Four times a day (QID) | ORAL | Status: DC | PRN

## 2023-10-07 MED ORDER — ACETAMINOPHEN 325 MG PO TABS
650.0000 mg | ORAL_TABLET | Freq: Four times a day (QID) | ORAL | Status: DC | PRN
  Administered 2023-10-07: 650 mg via ORAL
  Filled 2023-10-07: qty 2

## 2023-10-07 MED ORDER — SODIUM CHLORIDE 0.9 % IV SOLN: 25.0000 mg | Freq: Four times a day (QID) | INTRAVENOUS | Status: DC | PRN

## 2023-10-07 MED ORDER — ACETAMINOPHEN 650 MG RE SUPP: 650.0000 mg | Freq: Four times a day (QID) | RECTAL | Status: DC | PRN

## 2023-10-07 MED ORDER — ONDANSETRON HCL 4 MG PO TABS: 4.0000 mg | ORAL_TABLET | Freq: Four times a day (QID) | ORAL | Status: DC | PRN

## 2023-10-07 MED ORDER — FENTANYL CITRATE PF 50 MCG/ML IJ SOSY
25.0000 ug | PREFILLED_SYRINGE | INTRAMUSCULAR | Status: DC | PRN
  Administered 2023-10-07: 25 ug via INTRAVENOUS
  Filled 2023-10-07: qty 1

## 2023-10-07 MED ORDER — METHYLPREDNISOLONE SODIUM SUCC 40 MG IJ SOLR
20.0000 mg | Freq: Once | INTRAMUSCULAR | Status: AC
  Administered 2023-10-07: 20 mg via INTRAVENOUS
  Filled 2023-10-07: qty 1

## 2023-10-07 MED ORDER — METHYLPREDNISOLONE SODIUM SUCC 40 MG IJ SOLR
20.0000 mg | Freq: Three times a day (TID) | INTRAMUSCULAR | Status: DC
  Administered 2023-10-07 – 2023-10-10 (×10): 20 mg via INTRAVENOUS
  Filled 2023-10-07 (×10): qty 1

## 2023-10-07 MED ORDER — PROMETHAZINE HCL 12.5 MG RE SUPP: 12.5000 mg | Freq: Four times a day (QID) | RECTAL | Status: DC | PRN

## 2023-10-07 MED ORDER — HYDROMORPHONE HCL 1 MG/ML IJ SOLN
1.0000 mg | INTRAMUSCULAR | Status: DC | PRN
  Administered 2023-10-07 (×2): 1 mg via INTRAVENOUS
  Filled 2023-10-07 (×2): qty 1

## 2023-10-07 MED ORDER — OXYCODONE HCL 5 MG PO TABS
5.0000 mg | ORAL_TABLET | ORAL | Status: DC | PRN
  Administered 2023-10-07 – 2023-10-08 (×3): 5 mg via ORAL
  Filled 2023-10-07 (×3): qty 1

## 2023-10-07 MED ORDER — ONDANSETRON HCL 4 MG/2ML IJ SOLN: 4.0000 mg | INTRAMUSCULAR | Status: DC | PRN

## 2023-10-07 NOTE — Consult Note (Addendum)
 Referring Provider: Dr. Rosena Conradi Primary Care Physician:  Nita Bast, NP Primary Gastroenterologist:  Dr. Laurell Pond  Reason for Consultation: Crohn's disease  HPI: Whitney Lowe is a 38 y.o. female with a history of depression, bipolar disorder, ADHD, uterine fibroids, C. difficile 2019, erythema nodosum and Crohn's ileocolitis initially diagnosed 06/2016. Intolerant to immunomodulators. 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 She was on Stelara  for about one year which was ineffective then switched to Entyvio  March 2022 without clinical remission and was subsequently started on Skyrizi  June 2024.   She developed nausea, vomiting with upper abdominal pain therefore she presented to the ED 10/06/2023 for further evaluation.  Labs in the ED showed a WBC count of 8.8.  Hemoglobin 12.7.  Hematocrit 39.1.  Platelets 374.  BUN 7.  Creatinine 0.73.  Sodium 138.  Potassium 4.0.  Total bili 0.5.  Alk phos 57.  AST 13.  ALT 9.  Lipase 25.  Beta-hCG negative.  Sed rate 12.  CRP pending.  GI pathogen panel, C. difficile quick screen and fecal calprotectin test ordered, not yet collected.  CTAP showed a long segment wall thickening involving the distal/terminal ileum consistent with acute inflammatory Crohn's disease and dilated fluid-filled small bowel in the central abdomen favoring adynamic small bowel ileus and SBO secondary to fibrostenotic disease/stricture possible without pneumatosis or free air.  She was started on Methylprednisolone   20 mg IV 3 times daily. GI consult was requested for further management of suspected Crohn's flare.  She stopped taking Skyrizi  05/2023 because she thought is caused significant constipation. She takes Metamucil daily which typically results in passing a soft brown stool most days. She has intermittent bright red blood per the rectum, drops of blood in the toilet water and on the toilet tissue which occurs when straining and passing hard stools. She felt constipated on  Sat 6/14 and used a fleet enema which resulted in passing a BM. Last BM was yesterday. No diarrhea. She awakened yesterday and drank a cup of coffee and shortly after developed upper abdominal pain which progressively worsened throughout the day. She started vomiting up water and ginger ale around 6pm with persistent upper abdominal cramp like pain therefore she presented to the ED for further evaluation. No NSAID use. She ate a few bites of red jello and beef broth this morning which she just vomited up while I was at the bedside. Currently rates her upper and central abdominal pain a 9 out of 10.  She has less pain if she rests on her left side. Last received Oxycodone  5mg  at 7:30 AM.  Morphine  controled her pain but did not last long and she did not like the way it made her feel.  RN administering Zofran  4mg  IV at this time.   Her most recent colonoscopy was 06/02/2021 done while on Entyvio  which showed active Crohn's inflammation at the ileocecal valve and terminal ileum. It was felt her Crohn's disease to the colon was well-controlled on Entyvio  and less effective to the small bowel. A small bowel MRI 06/18/2021 showed persistent long segment of bowel wall thickening and mucosal hyperemia enhancement involving the distal and terminal ileum with several skip lesions consistent with active Crohn's ileitis without obstruction. Dr. Bridgett Camps assessed her intermittent abdominal symptoms were likely partial obstructions related to ileal Crohn's disease with stenosis at the ileocecal valve. She was started on Skyrizi  09/2022.  She was previously seen by Dr. Ara Knee at [facility] 06/10/2022 due to having an anterior anal fissure with perianal pain concerning  for perianal Crohn's disease. She underwent EUA with drainage of abscess, Botox injection into the internal anal sphicncter, anal fissurectomy and sigmoidoscopy 06/16/2022.   Mother with Crohn's disease.   GI PROCEDURES:  Colonoscopy 06/02/2021 on Entyvio :  -Simple  Endoscopic Score for Crohn's Disease: 3; mucosal inflammatory changes secondary to Crohn's disease at IC valve. Colonic mucosa with scarring but no evidence of active inflammation consistent with Crohn's colitis in remission (Entyvio ). Biopsied (Jar 1 ileum, Jar 2 right/transverse colon, Jar 3 left colon). - Mild inflammation was found at the dentate line/anus secondary to mild Crohn's 1. Surgical [P], colon, ileocecal valve INFLAMED FRAGMENTS OF LARGE BOWEL MUCOSA WITH NECROTIC TISSUE AND ACUTE INFLAMMATORY EXUDATE. CHRONIC CRYPT DISTORTION AND CRYPT DESTRUCTION IS NOTED. HISTOLOGIC FINDINGS ARE CONSISTENT WITH HISTORY OF ACTIVE INFLAMMATORY BOWEL DISEASE. NO DYSPLASIA IS SEEN. IMMUNOHISTOCHEMISTRY WITH APPROPRIATE CONTROLS FOR PANCYTOKERATIN DOES NOT HIGHLIGHT ANY MALIGNANCY IN THE NECROTIC TISSUE. CLINICAL AND ENDOSCOPIC CORRELATION IS REQUIRED. 2. Surgical [P], right colon FRAGMENTS OF LARGE BOWEL MUCOSA SHOWING NO SIGNIFICANT INFLAMMATION OR CHRONIC CRYPT ARCHITECTURAL CHANGES. NO DYSPLASIA OR MALIGNANCY IS SEEN. 3. Surgical [P], left colon FRAGMENTS OF LARGE BOWEL MUCOSA SHOWING MILD NONSPECIFIC INFLAMMATION WITH FOCAL CRYPT BRANCHING. NO DYSPLASIA OR MALIGNANCY IS SEEN.  EGD 02/29/2020: Normal esophagus. Multiple diminutive erosions in the incisura and gastric antrum. Normal duodenum. Biopsies from the duodenum were unremarkable. Gastric biopsy showed slight chronic inflammation without H. pylori.    Colonoscopy 02/29/2020 on Stelara : Ileocecal valve was stenosed and the terminal ileum not able to be examined. There were aphthous ulcerations scattered throughout the colon consistent with active Crohn's disease. Her simple endoscopic score for Crohn's was 21. Distal rectum and anal canal were normal. Biopsies showed minimal active inflammation without granulomas. Recall colonoscopy due 02/2022.  1. Surgical [P], duodenal - UNREMARKABLE DUODENAL MUCOSA. - NO FEATURES OF CELIAC SPRUE OR  GRANULOMAS. 2. Surgical [P], gastric antrum and gastric body - ANTRAL AND OXYNTIC MUCOSA WITH SLIGHT CHRONIC INFLAMMATION. Jhon Moselle NEGATIVE FOR HELICOBACTER PYLORI. - NO INTESTINAL METAPLASIA, DYSPLASIA OR CARCINOMA. 3. Surgical [P], right colon - RARE FOCUS OF MINIMAL ACTIVE INFLAMMATION. - SEE MICROSCOPIC DESCRIPTION - NO GRANULOMAS OR DYSPLASIA. 4. Surgical [P], left colon - RARE FOCUS OF MINIMAL ACTIVE INFLAMMATION. - SEE MICROSCOPIC DESCRIPTION - NO GRANULOMAS OR DYSPLASIA  PRIOR IMAGE STUDIES:  MR ENTEROGRAPHY 06/18/2021: COMPARISON:  07/21/2019 MR enterography. 04/21/2020 CT abdomen/pelvis.   FINDINGS: COMBINED FINDINGS FOR BOTH MR ABDOMEN AND PELVIS   Lower chest: No acute abnormality at the lung bases.   Hepatobiliary: Normal liver size and configuration. No liver mass. Normal gallbladder with no cholelithiasis. No biliary ductal dilatation. Common bile duct diameter 2 mm. No choledocholithiasis. No evidence of biliary masses, strictures or beading.   Pancreas: No pancreatic mass or duct dilation.  No pancreas divisum.   Spleen: Normal size. No mass.   Adrenals/Urinary Tract: Normal adrenals. No hydronephrosis. Normal kidneys with no renal mass. Normal urinary bladder.   Stomach/Bowel: Normal non-distended stomach. No dilated small bowel loops. Long segment (approximately 25 cm in total length) of bowel wall thickening and mucosal hyperenhancement involving the distal and terminal ileum with several skip lesions, not substantially changed in extent of involvement or degree of wall thickening since 04/21/2020 CT abdomen/pelvis. No new sites of small bowel wall thickening or mucosal hyperenhancement. Normal colon and rectum with no colorectal wall thickening or mucosal hyperenhancement. Appendix not discretely visualized. No evidence of bowel fistula.   Vascular/Lymphatic: Normal caliber abdominal aorta. Patent portal, splenic, hepatic and renal veins.  No pathologically enlarged lymph nodes in the abdomen or pelvis.   Reproductive: Mildly enlarged myomatous uterus with dominant 2.5 cm posterior right uterine body subserosal fibroid, decreased from 3.2 cm on 07/21/2019 MR enterography study. No suspicious ovarian or adnexal masses. Simple 1.3 cm left ovarian follicular cyst (series 4/image 54).   Other: No ascites or focal fluid collection.   Musculoskeletal: No aggressive appearing focal osseous lesions.   IMPRESSION: 1. Persistent long segment of bowel wall thickening and mucosal hyperenhancement involving the distal and terminal ileum with several skip lesions, not substantially changed in extent or degree of wall thickening since 04/21/2020 CT abdomen/pelvis. Findings are compatible with active Crohn's ileitis. No new sites of active inflammatory bowel disease. 2. No evidence of bowel obstruction, fistula or abscess. 3. Mildly enlarged myomatous uterus.    Past Medical History:  Diagnosis Date   ADHD    stopped abilify with preg   ADHD (attention deficit hyperactivity disorder)    Anemia    Anxiety    not currently on meds   Bipolar    Cataract    Rt eye   Complication of anesthesia    became combative    Crohn's ileocolitis (HCC) 06/19/2016   Eczema    Fibroids    uterine   Infection    UTI   Ovarian cyst    Periumbilical hernia     Past Surgical History:  Procedure Laterality Date   CESAREAN SECTION N/A 01/01/2019   Procedure: CESAREAN SECTION;  Surgeon: Jan Mcgill, MD;  Location: [facility] LD ORS;  Service: Obstetrics;  Laterality: N/A;   COLONOSCOPY     ESOPHAGOGASTRODUODENOSCOPY     removal of cyst in ear     6th grade    Prior to Admission medications   Medication Sig Start Date End Date Taking? Authorizing Provider  bisacodyl  (DULCOLAX) 10 MG suppository Place 1 suppository (10 mg total) rectally as needed for moderate constipation. 04/28/23   Edson Graces, MD  FIBER PO Take 2 capsules by mouth  daily.    [provider]  Ipratropium-Albuterol  (COMBIVENT  RESPIMAT) 20-100 MCG/ACT AERS respimat Inhale 1 puff into the lungs as needed. 07/21/22   [provider]  risankizumab -rzaa (SKYRIZI ) 600 MG/10ML injection Infuse over 1 hour week 0, week 4 and week 8 with maintenance 360 mg SQ at week 12 then every 8 weeks. 03/17/22   Edmonia Gottron, PA-C    Current Facility-Administered Medications  Medication Dose Route Frequency Provider Last Rate Last Admin   acetaminophen  (TYLENOL ) tablet 650 mg  650 mg Oral Q6H PRN Myrl Askew, MD   650 mg at 10/07/23 0216   Or   acetaminophen  (TYLENOL ) suppository 650 mg  650 mg Rectal Q6H PRN Myrl Askew, MD       enoxaparin  (LOVENOX ) injection 40 mg  40 mg Subcutaneous Q24H Dorrell, Robert, MD       fentaNYL  (SUBLIMAZE ) injection 25 mcg  25 mcg Intravenous Q2H PRN Myrl Askew, MD   25 mcg at 10/07/23 0053   methylPREDNISolone  sodium succinate (SOLU-MEDROL ) 40 mg/mL injection 20 mg  20 mg Intravenous Q8H Dorrell, Porfirio Bristol, MD       ondansetron  (ZOFRAN ) tablet 4 mg  4 mg Oral Q6H PRN Dorrell, Robert, MD       Or   ondansetron  (ZOFRAN ) injection 4 mg  4 mg Intravenous Q6H PRN Myrl Askew, MD   4 mg at 10/07/23 0221   oxyCODONE  (Oxy IR/ROXICODONE ) immediate release tablet 5 mg  5 mg Oral  Q4H PRN Myrl Askew, MD   5 mg at 10/07/23 0737    Allergies as of 10/06/2023 - Review Complete 10/06/2023  Allergen Reaction Noted   Benadryl  [diphenhydramine ]  01/07/2019    Family History  Problem Relation Age of Onset   Crohn's disease Mother        mom was adopted   Heart disease Mother    Anxiety disorder Mother    Blindness Mother        Rt eye   Kidney disease Mother        Late stage   Liver cancer Father    Diabetes Mellitus II Father    COPD Father    Hypertension Father    Asthma Father    ADD / ADHD Father    Diabetes Father        type 2   Anxiety disorder Father    Depression Father    Diabetes Sister         Type 2   Colon cancer Neg Hx    Esophageal cancer Neg Hx    Pancreatic cancer Neg Hx    Stomach cancer Neg Hx    Liver disease Neg Hx    Rectal cancer Neg Hx     Social History   Socioeconomic History   Marital status: Married    Spouse name: Marqus Hayward   Number of children: 1   Years of education: Not on file   Highest education level: Not on file  Occupational History   Occupation: Runner, broadcasting/film/video    Comment: [employer]  Tobacco Use   Smoking status: Former    Current packs/day: 0.00    Types: Cigars, Cigarettes    Quit date: 04/21/2018    Years since quitting: 5.4   Smokeless tobacco: Never   Tobacco comments:    social smoker  Vaping Use   Vaping status: Never Used  Substance and Sexual Activity   Alcohol use: Yes    Comment: rare   Drug use: Not Currently    Types: Marijuana    Comment: last use february 2020-    Sexual activity: Not Currently    Birth control/protection: None  Other Topics Concern   Not on file  Social History Narrative   Lives w/ fiance   Pregnant    [employer] psychology   [employer]   Social Drivers of Health   Financial Resource Strain: Low Risk  (03/10/2023)   Received from [facility]   Overall Financial Resource Strain (CARDIA)    Difficulty of Paying Living Expenses: Not hard at all  Food Insecurity: No Food Insecurity (10/07/2023)   Hunger Vital Sign    Worried About Running Out of Food in the Last Year: Never true    Ran Out of Food in the Last Year: Never true  Transportation Needs: No Transportation Needs (10/07/2023)   PRAPARE - Administrator, Civil Service (Medical): No    Lack of Transportation (Non-Medical): No  Physical Activity: Insufficiently Active (03/10/2023)   Received from [facility]   Exercise Vital Sign    On average, how many days per week do you engage in moderate to strenuous exercise (like a brisk walk)?: 2 days    On average, how many minutes do you engage in exercise at this level?: 20 min   Stress: No Stress Concern Present (03/10/2023)   Received from [facility] - Occupational Stress Questionnaire    Feeling of Stress :  Not at all  Social Connections: Somewhat Isolated (03/10/2023)   Received from [facility]   Social Network    How would you rate your social network (family, work, friends)?: Restricted participation with some degree of social isolation  Intimate Partner Violence: Not At Risk (10/07/2023)   Humiliation, Afraid, Rape, and Kick questionnaire    Fear of Current or Ex-Partner: No    Emotionally Abused: No    Physically Abused: No    Sexually Abused: No   Review of Systems: Gen: Denies fever, sweats or chills. No weight loss.  CV: Denies chest pain, palpitations or edema. Resp: Denies cough, shortness of breath of hemoptysis.  GI: See HPI. Mild acid reflux after vomiting.    GU : Denies urinary burning, blood in urine, increased urinary frequency or incontinence. MS: Denies joint pain, muscles aches or weakness. Derm: Denies rash, itchiness, skin lesions or unhealing ulcers. Psych: + Depression. Heme: Denies easy bruising, bleeding. Neuro:  Denies headaches, dizziness or paresthesias. Endo:  Denies any problems with DM, thyroid  or adrenal function.  Physical Exam: Vital signs in last 24 hours: Temp:  [97.8 F (36.6 C)-99.2 F (37.3 C)] 99.2 F (37.3 C) (06/18 0521) Pulse Rate:  [86-97] 90 (06/18 0521) Resp:  [10-19] 18 (06/18 0521) BP: (91-113)/(58-75) 106/71 (06/18 0521) SpO2:  [94 %-100 %] 98 % (06/18 0521) Weight:  [45.4 kg] 45.4 kg (06/17 2337) Last BM Date : 10/06/23 General: Alert 38 year old female with active abdominal pain and vomiting. Patient's vomiting subsided, RN at the bedside administering Zofran . Head:  Normocephalic and atraumatic. Eyes:  No scleral icterus. Conjunctiva pink. Ears:  Normal auditory acuity. Nose:  No deformity, discharge or lesions. Mouth:  Dentition intact. No  ulcers or lesions.  Neck:  Supple. No lymphadenopathy or thyromegaly.  Lungs: Breath sounds clear throughout. No wheezes, rhonchi or crackles.  Heart: Regular rate and rhythm, no murmurs. Abdomen: Abdomen is protuberant/distended but soft.  Hypoactive bowel sounds to all 4 quadrants.  Moderately tender throughout the upper abdomen and periumbilical area with less tenderness to the lower abdomen.  No rebound or guarding.  No palpable mass.  No bruit. Rectal: Deferred. Musculoskeletal:  Symmetrical without gross deformities.  Pulses:  Normal pulses noted. Extremities:  Without clubbing or edema. Neurologic:  Alert and  oriented x 4. No focal deficits.  Skin:  Intact without significant lesions or rashes. Psych:  Alert and cooperative. Normal mood and affect.  Intake/Output from previous day: 06/17 0701 - 06/18 0700 In: 1150 [P.O.:150; IV Piggyback:1000] Out: -  Intake/Output this shift: Total I/O In: 340 [P.O.:340] Out: 650 [Urine:650]  Lab Results: Recent Labs    10/06/23 2200  WBC 8.8  HGB 12.7  HCT 39.1  PLT 374   BMET Recent Labs    10/06/23 2200  NA 138  K 4.0  CL 111  CO2 21*  GLUCOSE 96  BUN 7  CREATININE 0.73  CALCIUM  7.6*   LFT Recent Labs    10/06/23 2200  PROT 6.3*  ALBUMIN 2.7*  AST 13*  ALT 9  ALKPHOS 57  BILITOT 0.5   PT/INR No results for input(s): LABPROT, INR in the last 72 hours. Hepatitis Panel No results for input(s): HEPBSAG, HCVAB, HEPAIGM, HEPBIGM in the last 72 hours.    Studies/Results: CT ABDOMEN PELVIS W CONTRAST Result Date: 10/06/2023 EXAM: CT ABDOMEN AND PELVIS WITH CONTRAST 10/06/2023 11:49:20 PM TECHNIQUE: CT of the abdomen and pelvis was performed with the administration of 75 mL of intravenous iohexol  (OMNIPAQUE ) 300  mg/mL solution. Multiplanar reformatted images are provided for review. Automated exposure control, iterative reconstruction, and/or weight based adjustment of the mA/kV was utilized to reduce the  radiation dose to as low as reasonably achievable. COMPARISON: MR enterography dated 06/18/2021. CLINICAL HISTORY: Abdominal pain, acute, nonlocalized. Upper abdominal pain, nausea, and vomiting for one day. FINDINGS: LOWER CHEST: No acute abnormality. LIVER: The liver is unremarkable. GALLBLADDER AND BILE DUCTS: Gallbladder is unremarkable. No biliary ductal dilatation. SPLEEN: No acute abnormality. PANCREAS: No acute abnormality. ADRENAL GLANDS: No acute abnormality. KIDNEYS, URETERS AND BLADDER: No stones in the kidneys or ureters. No hydronephrosis. No perinephric or periureteral stranding. Urinary bladder is unremarkable. GI AND BOWEL: Long segment wall thickening involving the distal/terminal ileum (images 57 and 63) reflecting acute inflammatory Crohn's disease. Dilated, fluid filled small bowel in the central abdomen, favoring adynamic small bowel ileus in this clinical setting, although small bowel obstruction secondary to a terminal ileal stricture is technically possible. Colon is decompressed. PERITONEUM AND RETROPERITONEUM: No ascites. No free air. VASCULATURE: Aorta is normal in caliber. LYMPH NODES: No lymphadenopathy. REPRODUCTIVE ORGANS: Calcified uterine fibroids. BONES AND SOFT TISSUES: No acute osseous abnormality. No focal soft tissue abnormality. IMPRESSION: 1. Long segment wall thickening involving the distal/terminal ileum, consistent with acute inflammatory Crohn's disease. 2. Dilated, fluid-filled small bowel in the central abdomen, favoring adynamic small bowel ileus. Small bowel obstruction secondary to fibrostenotic disease/stricture is possible but not favored. 3. No pneumatosis or free air. Electronically signed by: Zadie Herter MD 10/06/2023 11:58 PM EDT RP Workstation: [id number]    IMPRESSION/PLAN:  38 year old female with Crohn's ileocolitis and anorectal disease (previously failed Humira, Stelara  and Entyvio ) admitted with nausea, vomiting and upper abdominal pain.  Patient self discontinued Skyrizi  05/2023 due to concerns regarding associated constipation. WBC 8.8.  Hemoglobin 12.7.  Sed rate 12. CTAP showed a long segment wall thickening involving the distal/terminal ileum consistent with acute inflammatory Crohn's disease and dilated fluid-filled small bowel in the central abdomen favoring adynamic small bowel ileus and SBO secondary to fibrostenotic disease/stricture possible without pneumatosis or free air.  -Clear liquid diet -Zofran  4 mg IV every 6 hours as needed -If she has further active vomiting will change to NPO and will need NGT placement  -IV fluids and pain management per the hospitalist  -Continue Methylprednisolone  20 mg IV every 8 hours -Await fecal calprotectin results, specimen not yet collected as patient has not had a BM since admission -May require general surgery consultation in setting if suspected SBO secondary to Crohn's stricture  -Defer recommendations regarding alternative biologic therapy to Dr. Florencia Hunter Edrie Gower  10/07/2023, 10:17 AM     [facility] Attending   I have taken an interval history, reviewed the chart and examined the patient.  Labs, ER notes hospitalist notes reviewed, I have reviewed the images of the CT scan personally.  I agree with the Advanced Practitioner's note, impression and recommendations with the following additions:  This looks like Crohn's flare with inflammatory stricture of distal ileum. Given that she has not had any diarrhea I am discontinuing C. difficile, GI pathogen panel orders and enteric precautions.  I do not think a fecal calprotectin will tell us  much at this point either so I have discontinued that.  Needs to continue her steroids.  Will follow-up tomorrow check x-ray tomorrow.  She has had better pain relief with institution of hydromorphone.  Bowel sounds are diminished abdomen is protuberant and somewhat distended but soft and nontender right now.  Constipation  problems sound like outlet dysfunction.  I am not aware that Skyrizi  causes constipation.  I think that was probably true true and unrelated.  I do not see any rectal pathology on CT scan.  I did not perform a rectal but we should do that prior to discharge to assess for abnormalities that could be contributing to her intermittent constipation.  She has been off her Skyrizi  for about 4 months now.  That is probably why she is flaring.  She may need to restart that versus changing to another biologic.  That will be deferred to her primary gastroenterologist, Dr. Bridgett Camps.  Should she fail to resolve surgery is a consideration but I think that unlikely to be needed in the setting.   Kenney Peacemaker, MD, [facility]  Gastroenterology See Tilford Foley on call - gastroenterology for best contact person 10/07/2023 4:16 PM

## 2023-10-07 NOTE — H&P (Signed)
 History and Physical    Whitney Lowe WJX:[id number] DOB: 17-May-1985 DOA: 10/06/2023  PCP: Nita Bast, NP   Chief Complaint: abdominal pain  HPI: Whitney Lowe is a 38 y.o. female with medical history significant of Crohn's colitis, bipolar who presents emergency department due to abdominal pain.  Patient follows with [facility].  She is currently on Skyrizi .  She developed abdominal pain and vomiting for the last 24 hours.  She has had decreased appetite and poor p.o. intake.  She presented to the emergency department where she was found to be afebrile and hemodynamically stable.  Labs were obtained on presentation which showed WBC 8.8, hemoglobin 12.7, platelets 374, CMP unrevealing, lipase within normal limits.  Patient underwent CT abdomen pelvis which showed wall thickening of TI and dilated loops of bowel consistent with f ileus possible fibrostenotic disease.  GI was consulted and patient was started on IV steroids.   Review of Systems: Review of Systems  Constitutional: Negative.  Negative for chills, fever and weight loss.  HENT: Negative.    Eyes: Negative.   Respiratory: Negative.    Cardiovascular: Negative.   Gastrointestinal:  Positive for abdominal pain, diarrhea, nausea and vomiting.  Genitourinary: Negative.   Musculoskeletal: Negative.   Skin: Negative.   Neurological: Negative.   Endo/Heme/Allergies: Negative.   Psychiatric/Behavioral: Negative.       As per HPI otherwise 10 point review of systems negative.   Allergies  Allergen Reactions   Benadryl  [Diphenhydramine ]     Pt reported new onset tightness at her throat shortly after receiving 25 mg Iv Benadryl . She also was noticeably less oriented than previous    Past Medical History:  Diagnosis Date   ADHD    stopped abilify with preg   ADHD (attention deficit hyperactivity disorder)    Anemia    Anxiety    not currently on meds   Bipolar    Cataract    Rt eye   Complication of  anesthesia    became combative    Crohn's ileocolitis (HCC) 06/19/2016   Eczema    Fibroids    uterine   Infection    UTI   Ovarian cyst    Periumbilical hernia     Past Surgical History:  Procedure Laterality Date   CESAREAN SECTION N/A 01/01/2019   Procedure: CESAREAN SECTION;  Surgeon: Jan Mcgill, MD;  Location: [facility] LD ORS;  Service: Obstetrics;  Laterality: N/A;   COLONOSCOPY     ESOPHAGOGASTRODUODENOSCOPY     removal of cyst in ear     6th grade     reports that she quit smoking about 5 years ago. Her smoking use included cigars and cigarettes. She has never used smokeless tobacco. She reports current alcohol use. She reports that she does not currently use drugs after having used the following drugs: Marijuana.  Family History  Problem Relation Age of Onset   Crohn's disease Mother        mom was adopted   Heart disease Mother    Anxiety disorder Mother    Blindness Mother        Rt eye   Kidney disease Mother        Late stage   Liver cancer Father    Diabetes Mellitus II Father    COPD Father    Hypertension Father    Asthma Father    ADD / ADHD Father    Diabetes Father        type 2   Anxiety  disorder Father    Depression Father    Diabetes Sister        Type 2   Colon cancer Neg Hx    Esophageal cancer Neg Hx    Pancreatic cancer Neg Hx    Stomach cancer Neg Hx    Liver disease Neg Hx    Rectal cancer Neg Hx     Prior to Admission medications   Medication Sig Start Date End Date Taking? Authorizing Provider  bisacodyl  (DULCOLAX) 10 MG suppository Place 1 suppository (10 mg total) rectally as needed for moderate constipation. 04/28/23   Edson Graces, MD  FIBER PO Take 2 capsules by mouth daily.    [provider]  Ipratropium-Albuterol  (COMBIVENT  RESPIMAT) 20-100 MCG/ACT AERS respimat Inhale 1 puff into the lungs as needed. 07/21/22   [provider]  risankizumab -rzaa (SKYRIZI ) 600 MG/10ML injection Infuse over 1 hour week 0, week  4 and week 8 with maintenance 360 mg SQ at week 12 then every 8 weeks. 03/17/22   Edmonia Gottron, PA-C    Physical Exam: Vitals:   10/06/23 2330 10/06/23 2337 10/07/23 0000 10/07/23 0012  BP: 109/67  (!) 91/59   Pulse: 94  97   Resp: 14  13   Temp:    98.1 F (36.7 C)  TempSrc:    Oral  SpO2: 96%  94%   Weight:  45.4 kg    Height:  4' 10 (1.473 m)     Physical Exam Constitutional:      General: She is not in acute distress.    Appearance: She is normal weight.  HENT:     Mouth/Throat:     Mouth: Mucous membranes are moist.   Eyes:     Extraocular Movements: Extraocular movements intact.    Cardiovascular:     Rate and Rhythm: Normal rate and regular rhythm.  Pulmonary:     Effort: Pulmonary effort is normal.     Breath sounds: Normal breath sounds.  Abdominal:     General: Abdomen is flat. Bowel sounds are normal.     Palpations: Abdomen is soft.   Skin:    General: Skin is warm.   Neurological:     General: No focal deficit present.     Mental Status: She is alert.       Labs on Admission: I have personally reviewed the patients's labs and imaging studies.  Assessment/Plan Principal Problem:   Crohn's colitis (HCC)   # Crohn's flare # Patient presented with nausea vomiting abdominal pain - CT imaging with terminal ileitis - Inflammatory markers pending  Plan: Follow-up ESR, CRP Check fecal calprotectin Check C. difficile and GI pathogen panel GI consulted and recommended Solu-Medrol  20 mg Q8 CLD    Admission status: Inpatient Med-Surg  Certification: The appropriate patient status for this patient is INPATIENT. Inpatient status is judged to be reasonable and necessary in order to provide the required intensity of service to ensure the patient's safety. The patient's presenting symptoms, physical exam findings, and initial radiographic and laboratory data in the context of their chronic comorbidities is felt to place them at high risk for  further clinical deterioration. Furthermore, it is not anticipated that the patient will be medically stable for discharge from the hospital within 2 midnights of admission.   * I certify that at the point of admission it is my clinical judgment that the patient will require inpatient hospital care spanning beyond 2 midnights from the point of admission due to  high intensity of service, high risk for further deterioration and high frequency of surveillance required.Myrl Askew MD [facility] If 7PM-7AM, please contact night-coverage [email or web address]  10/07/2023, 1:01 AM

## 2023-10-07 NOTE — ED Notes (Signed)
 Patient resting at this time.

## 2023-10-07 NOTE — ED Provider Notes (Signed)
 Care assumed from Dr. Monnie Anthony.  Patient with history of Crohn's disease pending CT imaging.  Somewhat hypotensive in the 90s.  Cramping in her upper abdomen with nausea and vomiting.  IMPRESSION: 1. Long segment wall thickening involving the distal/terminal ileum, consistent with acute inflammatory Crohn's disease. 2. Dilated, fluid-filled small bowel in the central abdomen, favoring adynamic small bowel ileus. Small bowel obstruction secondary to fibrostenotic disease/stricture is possible but not favored. 3. No pneumatosis or free air.   Discussed with gastroenterology Dr. Rosaline Coma.  Patient follows with [facility] in the outpatient setting.  She agrees with admission for IV steroids including Solu-Medrol  20 mg 3 times daily.  Will not place NG tube at this time as she is not vomiting.  Admission for IV fluids, pain and nausea medications and IV steroids discussed with Dr. Jeannene Milling.   Earma Gloss, MD 10/07/23 [phone]

## 2023-10-07 NOTE — Progress Notes (Signed)
 Same day note   Whitney Lowe is a 38 y.o. female with medical history significant of Crohn's colitis, bipolar disorder presented to hospital with abdominal pain and vomiting for 24 hours with decreased appetite..  Of note patient follows up with  [facility] gastroenterology.  She is currently on Skyrizi .  Patient then presented to the emergency department where she was found to be afebrile and hemodynamically stable.  Labs were obtained on presentation which showed WBC 8.8, hemoglobin 12.7, platelets 374, CMP unrevealing, lipase within normal limits.  Patient underwent CT abdomen pelvis which showed wall thickening of terminal ileum and dilated loops of bowel consistent with  ileus possible fibrostenotic disease.  GI was consulted and patient was started on IV steroids.   Patient seen and examined at bedside.  Patient was admitted to the hospital for abdominal pain  At the time of my evaluation, patient complains of abdominal pain, severe and pain medication not lasting enough.  Had episode of vomiting today.  She stated her main concern is pain.  Physical examination reveals obese built female in moderate distress due to pain.  Diffuse abdominal tenderness noted  Laboratory data and imaging was reviewed  Assessment and Plan.  Acute Crohn's flare Patient presented with nausea vomiting abdominal pain.  CT showed terminal ileitis.  ESR of 12.  Pregnancy test was negative.  Follow-up CRP fecal calprotectin. Check C. difficile and GI pathogen panel .GI consulted and recommended Solu-Medrol  20 mg Q8. CLD follow GI recommendations.  Communicated with GI in recommend NG tube if continued vomiting/surgical consult  No Charge  Signed,  Lindwood Rhody, MD [facility]

## 2023-10-07 NOTE — Plan of Care (Signed)
   Problem: Education: Goal: Knowledge of General Education information will improve Description: Including pain rating scale, medication(s)/side effects and non-pharmacologic comfort measures Outcome: Progressing   Problem: Clinical Measurements: Goal: Will remain free from infection Outcome: Progressing Goal: Diagnostic test results will improve Outcome: Progressing

## 2023-10-07 NOTE — Plan of Care (Signed)

## 2023-10-08 ENCOUNTER — Inpatient Hospital Stay (HOSPITAL_COMMUNITY)

## 2023-10-08 DIAGNOSIS — K5010 Crohn's disease of large intestine without complications: Secondary | ICD-10-CM | POA: Diagnosis not present

## 2023-10-08 LAB — COMPREHENSIVE METABOLIC PANEL WITH GFR
ALT: 11 U/L (ref 0–44)
AST: 18 U/L (ref 15–41)
Albumin: 2.8 g/dL — ABNORMAL LOW (ref 3.5–5.0)
Alkaline Phosphatase: 50 U/L (ref 38–126)
Anion gap: 10 (ref 5–15)
BUN: 9 mg/dL (ref 6–20)
CO2: 24 mmol/L (ref 22–32)
Calcium: 8 mg/dL — ABNORMAL LOW (ref 8.9–10.3)
Chloride: 103 mmol/L (ref 98–111)
Creatinine, Ser: 0.81 mg/dL (ref 0.44–1.00)
GFR, Estimated: >60 mL/min (ref >60)
Glucose, Bld: 131 mg/dL — ABNORMAL HIGH (ref 70–99)
Potassium: 3.8 mmol/L (ref 3.5–5.1)
Sodium: 137 mmol/L (ref 135–145)
Total Bilirubin: 0.4 mg/dL (ref 0.0–1.2)
Total Protein: 6.9 g/dL (ref 6.5–8.1)

## 2023-10-08 LAB — CBC
HCT: 39.7 % (ref 36.0–46.0)
Hemoglobin: 12.8 g/dL (ref 12.0–15.0)
MCH: 29.4 pg (ref 26.0–34.0)
MCHC: 32.2 g/dL (ref 30.0–36.0)
MCV: 91.3 fL (ref 80.0–100.0)
Platelets: 422 10*3/uL — ABNORMAL HIGH (ref 150–400)
RBC: 4.35 MIL/uL (ref 3.87–5.11)
RDW: 13.6 % (ref 11.5–15.5)
WBC: 6.5 10*3/uL (ref 4.0–10.5)
nRBC: 0 % (ref 0.0–0.2)

## 2023-10-08 LAB — MAGNESIUM: Magnesium: 2.3 mg/dL (ref 1.7–2.4)

## 2023-10-08 NOTE — Progress Notes (Signed)
   10/08/23 1025  TOC Brief Assessment  Insurance and Status Reviewed  Patient has primary care physician Yes  Home environment has been reviewed home with spouse  Prior level of function: independent  Prior/Current Home Services No current home services  Social Drivers of Health Review SDOH reviewed no interventions necessary  Readmission risk has been reviewed Yes  Transition of care needs no transition of care needs at this time

## 2023-10-08 NOTE — Progress Notes (Signed)
 PROGRESS NOTE  Signora Zucco ZOX:[id number] DOB: May 20, 1985 DOA: 10/06/2023 PCP: Nita Bast, NP   LOS: 1 day   Brief narrative:    Whitney Lowe is a 38 y.o. female with medical history significant of Crohn's colitis, presented to hospital with abdominal pain and vomiting for 24 hours with decreased appetite..  Of note patient follows up with  [facility] gastroenterology.  She is currently on Skyrizi .  Patient then presented to the emergency department where she was found to be afebrile and hemodynamically stable.  Labs were obtained on presentation which showed WBC 8.8, hemoglobin 12.7, platelets 374, CMP unrevealing, lipase within normal limits.  Patient underwent CT abdomen pelvis which showed wall thickening of terminal ileum and dilated loops of bowel consistent with  ileus possible fibrostenotic disease.  GI was consulted and patient was started on IV steroids.  Patient was then admitted hospital for further evaluation and treatment.  Assessment/Plan: Principal Problem:   Crohn's colitis (HCC)  Patient presented with nausea, vomiting abdominal pain.  CT showed terminal ileitis.  ESR of 12.  Pregnancy test was negative.  Elevated CRP at 7.3.  Unable to obtain GI pathogen panel.  GI on board and has recommended consulted and recommended Solu-Medrol  20 mg Q8.  Currently on clear liquid diet.  Patient has significantly felt better today without any significant symptoms.  Will follow GI recommendations.   DVT prophylaxis: enoxaparin  (LOVENOX ) injection 40 mg Start: 10/07/23 1000 SCDs Start: 10/07/23 0100   Disposition: Home when okay with GI.  Status is: Inpatient Remains inpatient appropriate because: Pending clinical improvement, IV steroids    Code Status:     Code Status: Full Code  Family Communication: None at bedside  Consultants: GI  Procedures: None yet  Anti-infectives:  None  Anti-infectives (From admission, onward)    None        Subjective: Today,  patient was seen and examined at bedside.  Patient states that she feels significantly better today.  No abdominal pain this morning.  No nausea or vomiting.  Objective: Vitals:   10/07/23 2221 10/08/23 0622  BP: (!) 88/60 (!) 88/54  Pulse: 74 86  Resp: 18 18  Temp: (!) 97.5 F (36.4 C) 98 F (36.7 C)  SpO2: 98% 99%    Intake/Output Summary (Last 24 hours) at 10/08/2023 1146 Last data filed at 10/08/2023 0200 Gross per 24 hour  Intake 360 ml  Output 900 ml  Net -540 ml   Filed Weights   10/06/23 2337  Weight: 45.4 kg   Body mass index is 20.9 kg/m.   Physical Exam:  GENERAL: Patient is alert awake and oriented. Not in obvious distress. HENT: No scleral pallor or icterus. Pupils equally reactive to light. Oral mucosa is moist NECK: is supple, no gross swelling noted. CHEST: Clear to auscultation. No crackles or wheezes.   CVS: S1 and S2 heard, no murmur. Regular rate and rhythm.  ABDOMEN: Soft, nondistended, EXTREMITIES: No edema. CNS: Cranial nerves are intact. No focal motor deficits. SKIN: warm and dry without rashes.  Data Review: I have personally reviewed the following laboratory data and studies,  CBC: Recent Labs  Lab 10/06/23 2200 10/08/23 0715  WBC 8.8 6.5  NEUTROABS 7.2  --   HGB 12.7 12.8  HCT 39.1 39.7  MCV 89.1 91.3  PLT 374 422*   Basic Metabolic Panel: Recent Labs  Lab 10/06/23 2200 10/08/23 0715  NA 138 137  K 4.0 3.8  CL 111 103  CO2 21* 24  GLUCOSE 96  131*  BUN 7 9  CREATININE 0.73 0.81  CALCIUM  7.6* 8.0*  MG  --  2.3   Liver Function Tests: Recent Labs  Lab 10/06/23 2200 10/08/23 0715  AST 13* 18  ALT 9 11  ALKPHOS 57 50  BILITOT 0.5 0.4  PROT 6.3* 6.9  ALBUMIN 2.7* 2.8*   Recent Labs  Lab 10/06/23 2200  LIPASE 25   No results for input(s): AMMONIA in the last 168 hours. Cardiac Enzymes: No results for input(s): CKTOTAL, CKMB, CKMBINDEX, TROPONINI in the last 168 hours. BNP (last 3 results) No results  for input(s): BNP in the last 8760 hours.  ProBNP (last 3 results) No results for input(s): PROBNP in the last 8760 hours.  CBG: No results for input(s): GLUCAP in the last 168 hours. No results found for this or any previous visit (from the past 240 hours).   Studies: DG Abd 2 Views Result Date: 10/08/2023 CLINICAL DATA:  Abdominal pain EXAM: ABDOMEN - 2 VIEW COMPARISON:  CT abdomen and pelvis October 06, 2023 FINDINGS: Few mildly dilated bowel loops with air-fluid levels in the left upper quadrant are still present and correlates with a dilated bowel loop seen on the prior CT may correlate with nonspecific ileus. IMPRESSION: Nonspecific ileus Electronically Signed   By: Fredrich Jefferson M.D.   On: 10/08/2023 07:24   CT ABDOMEN PELVIS W CONTRAST Result Date: 10/06/2023 EXAM: CT ABDOMEN AND PELVIS WITH CONTRAST 10/06/2023 11:49:20 PM TECHNIQUE: CT of the abdomen and pelvis was performed with the administration of 75 mL of intravenous iohexol  (OMNIPAQUE ) 300 mg/mL solution. Multiplanar reformatted images are provided for review. Automated exposure control, iterative reconstruction, and/or weight based adjustment of the mA/kV was utilized to reduce the radiation dose to as low as reasonably achievable. COMPARISON: MR enterography dated 06/18/2021. CLINICAL HISTORY: Abdominal pain, acute, nonlocalized. Upper abdominal pain, nausea, and vomiting for one day. FINDINGS: LOWER CHEST: No acute abnormality. LIVER: The liver is unremarkable. GALLBLADDER AND BILE DUCTS: Gallbladder is unremarkable. No biliary ductal dilatation. SPLEEN: No acute abnormality. PANCREAS: No acute abnormality. ADRENAL GLANDS: No acute abnormality. KIDNEYS, URETERS AND BLADDER: No stones in the kidneys or ureters. No hydronephrosis. No perinephric or periureteral stranding. Urinary bladder is unremarkable. GI AND BOWEL: Long segment wall thickening involving the distal/terminal ileum (images 57 and 63) reflecting acute inflammatory  Crohn's disease. Dilated, fluid filled small bowel in the central abdomen, favoring adynamic small bowel ileus in this clinical setting, although small bowel obstruction secondary to a terminal ileal stricture is technically possible. Colon is decompressed. PERITONEUM AND RETROPERITONEUM: No ascites. No free air. VASCULATURE: Aorta is normal in caliber. LYMPH NODES: No lymphadenopathy. REPRODUCTIVE ORGANS: Calcified uterine fibroids. BONES AND SOFT TISSUES: No acute osseous abnormality. No focal soft tissue abnormality. IMPRESSION: 1. Long segment wall thickening involving the distal/terminal ileum, consistent with acute inflammatory Crohn's disease. 2. Dilated, fluid-filled small bowel in the central abdomen, favoring adynamic small bowel ileus. Small bowel obstruction secondary to fibrostenotic disease/stricture is possible but not favored. 3. No pneumatosis or free air. Electronically signed by: Zadie Herter MD 10/06/2023 11:58 PM EDT RP Workstation: [id number]      Rosena Conradi, MD  [facility] 10/08/2023  If 7PM-7AM, please contact night-coverage

## 2023-10-08 NOTE — Progress Notes (Addendum)
 [facility] Progress Note  CC:  Crohn's flare   Subjective: She endorses feeling much better today. She clarified that she did not have any nausea prior to her vomiting episodes prior to admission and yesterday but endorsed having acid reflux, hiccups which resulted in vomiting. She passed a nonbloody brown loose stool x 1 yesterday. No further BM or gas per the rectum since then. She is tolerating a clear liquid diet. Upper abdominal pain has significantly decreased.   Objective:  Vital signs in last 24 hours: Temp:  [97.5 F (36.4 C)-98.1 F (36.7 C)] 98 F (36.7 C) (06/19 0622) Pulse Rate:  [74-92] 86 (06/19 0622) Resp:  [18] 18 (06/19 0622) BP: (88-107)/(54-76) 88/54 (06/19 0622) SpO2:  [98 %-100 %] 99 % (06/19 0622) Last BM Date : 10/07/23 General: 38 year old female in NAD. Heart: RRR, no murmur. Pulm: Breath sounds clear throughout.  Abdomen: Soft, less distended today. Mild epigastric tenderness without rebound or guarding. Hypoactive bowel sounds auscultated to all 4 quadrants.  Rectal: Patient declined rectal exam, stated she would need to be sedated for rectal exam.  Extremities:  No edema. Neurologic:  Alert and  oriented x 4. Grossly normal neurologically. Psych:  Alert and cooperative. Normal mood and affect.  Intake/Output from previous day: 06/18 0701 - 06/19 0700 In: 700 [P.O.:700] Out: 1550 [Urine:1550] Intake/Output this shift: No intake/output data recorded.  Lab Results: Recent Labs    10/06/23 2200 10/08/23 0715  WBC 8.8 6.5  HGB 12.7 12.8  HCT 39.1 39.7  PLT 374 422*   BMET Recent Labs    10/06/23 2200  NA 138  K 4.0  CL 111  CO2 21*  GLUCOSE 96  BUN 7  CREATININE 0.73  CALCIUM  7.6*   LFT Recent Labs    10/06/23 2200  PROT 6.3*  ALBUMIN 2.7*  AST 13*  ALT 9  ALKPHOS 57  BILITOT 0.5   PT/INR No results for input(s): LABPROT, INR in the last 72 hours. Hepatitis Panel No results for input(s): HEPBSAG,  HCVAB, HEPAIGM, HEPBIGM in the last 72 hours.  DG Abd 2 Views Result Date: 10/08/2023 CLINICAL DATA:  Abdominal pain EXAM: ABDOMEN - 2 VIEW COMPARISON:  CT abdomen and pelvis October 06, 2023 FINDINGS: Few mildly dilated bowel loops with air-fluid levels in the left upper quadrant are still present and correlates with a dilated bowel loop seen on the prior CT may correlate with nonspecific ileus. IMPRESSION: Nonspecific ileus Electronically Signed   By: Fredrich Jefferson M.D.   On: 10/08/2023 07:24   CT ABDOMEN PELVIS W CONTRAST Result Date: 10/06/2023 EXAM: CT ABDOMEN AND PELVIS WITH CONTRAST 10/06/2023 11:49:20 PM TECHNIQUE: CT of the abdomen and pelvis was performed with the administration of 75 mL of intravenous iohexol  (OMNIPAQUE ) 300 mg/mL solution. Multiplanar reformatted images are provided for review. Automated exposure control, iterative reconstruction, and/or weight based adjustment of the mA/kV was utilized to reduce the radiation dose to as low as reasonably achievable. COMPARISON: MR enterography dated 06/18/2021. CLINICAL HISTORY: Abdominal pain, acute, nonlocalized. Upper abdominal pain, nausea, and vomiting for one day. FINDINGS: LOWER CHEST: No acute abnormality. LIVER: The liver is unremarkable. GALLBLADDER AND BILE DUCTS: Gallbladder is unremarkable. No biliary ductal dilatation. SPLEEN: No acute abnormality. PANCREAS: No acute abnormality. ADRENAL GLANDS: No acute abnormality. KIDNEYS, URETERS AND BLADDER: No stones in the kidneys or ureters. No hydronephrosis. No perinephric or periureteral stranding. Urinary bladder is unremarkable. GI AND BOWEL: Long segment wall thickening involving the distal/terminal  ileum (images 57 and 63) reflecting acute inflammatory Crohn's disease. Dilated, fluid filled small bowel in the central abdomen, favoring adynamic small bowel ileus in this clinical setting, although small bowel obstruction secondary to a terminal ileal stricture is technically possible.  Colon is decompressed. PERITONEUM AND RETROPERITONEUM: No ascites. No free air. VASCULATURE: Aorta is normal in caliber. LYMPH NODES: No lymphadenopathy. REPRODUCTIVE ORGANS: Calcified uterine fibroids. BONES AND SOFT TISSUES: No acute osseous abnormality. No focal soft tissue abnormality. IMPRESSION: 1. Long segment wall thickening involving the distal/terminal ileum, consistent with acute inflammatory Crohn's disease. 2. Dilated, fluid-filled small bowel in the central abdomen, favoring adynamic small bowel ileus. Small bowel obstruction secondary to fibrostenotic disease/stricture is possible but not favored. 3. No pneumatosis or free air. Electronically signed by: Zadie Herter MD 10/06/2023 11:58 PM EDT RP Workstation: [id number]   Patient Profile:  Whitney Lowe 38 y.o. female with a history of depression, bipolar disorder, ADHD, uterine fibroids, C. difficile 2019, erythema nodosum and Crohn's ileocolitis initially diagnosed 06/2016. Intolerant to immunomodulators. 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 She was on Stelara  for about one year which was ineffective then switched to Entyvio  March 2022 without clinical remission and was subsequently started on Skyrizi  June 2024 which she self discontinued 05/2023 because she though it caused significant constipation. Admitted 10/06/2023 with N/V and upper abdominal pain.   Assessment / Plan:  38 year old female with Crohn's ileocolitis and anorectal disease (previously failed Humira, Stelara  and Entyvio ) admitted with nausea, vomiting and upper abdominal pain. Patient self discontinued Skyrizi  05/2023 due to concerns regarding associated constipation. WBC 8.8 -> 6.5.  Hemoglobin 12.7 -> 12.8.  Sed rate 12. CRP 7.3. CTAP showed a long segment wall thickening involving the distal/terminal ileum consistent with acute inflammatory Crohn's disease and dilated fluid-filled small bowel in the central abdomen favoring adynamic small bowel ileus and SBO secondary to  fibrostenotic disease/stricture possible without pneumatosis or free air. Abdominal xray today showed few mildly dilated bowel loops with air-fluid levels in the left upper quadrant are still present and correlates with a dilated bowel loop seen on the prior CT, likely nonspecific ileus. She passed a loose nonbloody stool yesterday, no further BM or gas per the rectum since then. Abdominal pain controlled at this time.  -Clear liquid diet -Zofran  4 mg IV every 6 hours as needed -IV fluids and pain management per the hospitalist  -Keep K+ level > 4 < 5 and Mg 2 - 2.5 to optimize bowel activity  -Continue Methylprednisolone  20 mg IV every 8 hours -Pantoprazole  20mg  po QD -May require general surgery consultation in setting if suspected SBO secondary to Crohn's stricture  -Defer recommendations regarding restarting Skyrizi  vs switching to an alternative biologic therapy to patient's primary GI Dr. Bridgett Camps  -Await further recommendations per Dr. Willy Harvest  Principal Problem:   Crohn's colitis (HCC)     LOS: 1 day   Tory Freiberg  10/08/2023, 9:52 AM    [facility] Attending   I have taken an interval history, reviewed the chart and examined the patient. I agree with the Advanced Practitioner's note, impression and recommendations with the following additions:  She is improved.  Tolerating clear liquids.  Try full liquids in the morning.  Kenney Peacemaker, MD, [facility] See Tilford Foley on call - gastroenterology for best contact person 10/08/2023 6:27 PM

## 2023-10-08 NOTE — Plan of Care (Signed)

## 2023-10-09 ENCOUNTER — Telehealth: Payer: Self-pay | Admitting: Nurse Practitioner

## 2023-10-09 ENCOUNTER — Inpatient Hospital Stay (HOSPITAL_COMMUNITY)

## 2023-10-09 DIAGNOSIS — K50911 Crohn's disease, unspecified, with rectal bleeding: Secondary | ICD-10-CM | POA: Diagnosis not present

## 2023-10-09 DIAGNOSIS — K50812 Crohn's disease of both small and large intestine with intestinal obstruction: Principal | ICD-10-CM

## 2023-10-09 LAB — BASIC METABOLIC PANEL WITH GFR
Anion gap: 11 (ref 5–15)
BUN: 12 mg/dL (ref 6–20)
CO2: 23 mmol/L (ref 22–32)
Calcium: 8.5 mg/dL — ABNORMAL LOW (ref 8.9–10.3)
Chloride: 103 mmol/L (ref 98–111)
Creatinine, Ser: 0.79 mg/dL (ref 0.44–1.00)
GFR, Estimated: >60 mL/min (ref >60)
Glucose, Bld: 99 mg/dL (ref 70–99)
Potassium: 3.9 mmol/L (ref 3.5–5.1)
Sodium: 137 mmol/L (ref 135–145)

## 2023-10-09 LAB — CBC
HCT: 44.1 % (ref 36.0–46.0)
Hemoglobin: 13.8 g/dL (ref 12.0–15.0)
MCH: 29.1 pg (ref 26.0–34.0)
MCHC: 31.3 g/dL (ref 30.0–36.0)
MCV: 93 fL (ref 80.0–100.0)
Platelets: 471 10*3/uL — ABNORMAL HIGH (ref 150–400)
RBC: 4.74 MIL/uL (ref 3.87–5.11)
RDW: 13.8 % (ref 11.5–15.5)
WBC: 7 10*3/uL (ref 4.0–10.5)
nRBC: 0 % (ref 0.0–0.2)

## 2023-10-09 NOTE — Progress Notes (Addendum)
 [facility] Progress Note  CC:  Crohn's flare   Subjective: She passed nonbloody loose stool yesterday. No BM yet today but passing gas per the rectum. No N/V. No significant abdominal pain, mild epigastric tenderness. She is tolerating a clear liquid diet. Diet was advanced to full liquids this am but she does not like grits, yogurt or pudding. She intends to order soup for lunch. She dressed sitting up in the chair and asking if she can go home today.    Objective:  Vital signs in last 24 hours: Temp:  [97.8 F (36.6 C)-98.3 F (36.8 C)] 97.8 F (36.6 C) (06/19 2027) Pulse Rate:  [77-83] 77 (06/19 2027) Resp:  [16-18] 18 (06/19 2027) BP: (95-105)/(66-70) 105/70 (06/19 2027) SpO2:  [93 %-100 %] 93 % (06/19 2027) Last BM Date : 10/08/23 General: Alert 38 year old female in NAD. Heart: RRR, no murmur.  Pulm: Breath sounds clear.  Abdomen: Soft, mild distension. Mild epigastric tenderness without rebound or guarding.  Extremities:  No edema. Neurologic:  Alert and oriented x 4. Grossly normal neurologically. Psych:  Alert and cooperative. Normal mood and affect.    Lab Results: Recent Labs    10/06/23 2200 10/08/23 0715  WBC 8.8 6.5  HGB 12.7 12.8  HCT 39.1 39.7  PLT 374 422*   BMET Recent Labs    10/06/23 2200 10/08/23 0715  NA 138 137  K 4.0 3.8  CL 111 103  CO2 21* 24  GLUCOSE 96 131*  BUN 7 9  CREATININE 0.73 0.81  CALCIUM  7.6* 8.0*   LFT Recent Labs    10/08/23 0715  PROT 6.9  ALBUMIN 2.8*  AST 18  ALT 11  ALKPHOS 50  BILITOT 0.4    DG Abd 2 Views Result Date: 10/08/2023 CLINICAL DATA:  Abdominal pain EXAM: ABDOMEN - 2 VIEW COMPARISON:  CT abdomen and pelvis October 06, 2023 FINDINGS: Few mildly dilated bowel loops with air-fluid levels in the left upper quadrant are still present and correlates with a dilated bowel loop seen on the prior CT may correlate with nonspecific ileus. IMPRESSION: Nonspecific ileus Electronically Signed    By: Fredrich Jefferson M.D.   On: 10/08/2023 07:24   Patient Profile:  Kenneth Lax 38 y.o. female with a history of depression, bipolar disorder, ADHD, uterine fibroids, C. difficile 2019, erythema nodosum and Crohn's ileocolitis initially diagnosed 06/2016. Intolerant to immunomodulators. 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 She was on Stelara  for about one year which was ineffective then switched to Entyvio  March 2022 without clinical remission and was subsequently started on Skyrizi  June 2024 which she self discontinued 05/2023 because she though it caused significant constipation. Admitted 10/06/2023 with N/V and upper abdominal pain.   Assessment / Plan:  38 year old female with Crohn's ileocolitis and anorectal disease (previously failed Humira, Stelara  and Entyvio ) admitted with nausea, vomiting and upper abdominal pain. Patient self discontinued Skyrizi  05/2023 due to concerns regarding associated constipation. WBC 8.8 -> 6.5.  Hemoglobin 12.7 -> 12.8.  Sed rate 12. CRP 7.3. CTAP showed a long segment wall thickening involving the distal/terminal ileum consistent with acute inflammatory Crohn's disease and dilated fluid-filled small bowel in the central abdomen favoring adynamic small bowel ileus and SBO secondary to fibrostenotic disease/stricture possible without pneumatosis or free air. Abdominal xray 6/19 showed few mildly dilated bowel loops with air-fluid levels in the left upper quadrant are still present and correlates with a dilated bowel loop seen on the prior CT, likely nonspecific ileus. Tolerating  clear liquid diet. Passed loose stool yesterday, gas per the rectum today. Abdominal pain continues to improve.  -Advance to full liquid diet, patient will try soup as she dislikes other full liquid options. If full liquid diet tolerated, ok to advance to soft diet  -Abd xray -Zofran  4 mg IV every 6 hours as needed -IV fluids and pain management per the hospitalist  -Keep K+ level > 4 < 5 and Mg 2  - 2.5 to optimize bowel activity  -Continue Methylprednisolone  20 mg IV every 8 hours, transition to oral Prednisone  at time of discharge  -Pantoprazole  20mg  po QD -Defer recommendations regarding restarting Skyrizi  vs switching to an alternative biologic therapy to patient's primary GI Dr. Bridgett Camps  -Await further recommendations per Dr. Willy Harvest  Principal Problem:   Crohn's colitis (HCC)     LOS: 2 days   Tory Freiberg  10/09/2023, 10:40 AM    [facility] Attending   I have taken an interval history, reviewed the chart and examined the patient. I agree with the Advanced Practitioner's note, impression and recommendations with the following additions:  She is definitely improved.  We will advance her diet.  If she continues to do well would anticipate she could be discharged on a prednisone  taper tomorrow.  In anticipation of that I would put her on prednisone  40 mg daily for 1 week, 30 mg daily for 1 week 20 mg daily for 2 weeks, 10 mg daily for 2 weeks and then 5 mg daily for 2 weeks.  Will prescribe 10 mg tabs so she can break them for the 5 mg part of the taper.  I do plan to round on her tomorrow.  We will go ahead and arrange outpatient follow-up with Dr. Bridgett Camps or member of his team.  Kenney Peacemaker, MD, [facility] See Tilford Foley on call - gastroenterology for best contact person 10/09/2023 11:37 AM

## 2023-10-09 NOTE — Telephone Encounter (Signed)
 Christ, patient currently admitted to  [facility] due to a Crohn's flare. She will likely be discharged home tomorrow. Please contact patient and scheduled her for a follow up appointment with Dr. Bridgett Camps or [facility] in 3 to 4 weeks. THX.

## 2023-10-09 NOTE — Progress Notes (Signed)
 [facility]  PROGRESS NOTE  Whitney Lowe ZOX:[id number] DOB: [birth year] DOA: 10/06/2023 PCP: Nita Bast, NP   Brief HPI:   38 y.o. female with medical history significant of Crohn's colitis, presented to hospital with abdominal pain and vomiting for 24 hours with decreased appetite..  Of note patient follows up with  [facility] gastroenterology.  She is currently on Skyrizi .  Patient then presented to the emergency department where she was found to be afebrile and hemodynamically stable.  Labs were obtained on presentation which showed WBC 8.8, hemoglobin 12.7, platelets 374, CMP unrevealing, lipase within normal limits.  Patient underwent CT abdomen pelvis which showed wall thickening of terminal ileum and dilated loops of bowel consistent with  ileus possible fibrostenotic disease.     Assessment/Plan:    Crohn's colitis (HCC)   Patient presented with nausea, vomiting abdominal pain.  CT showed terminal ileitis.  ESR of 12.  Pregnancy test was negative.  Elevated CRP at 7.3.  Unable to obtain GI pathogen panel.  GI on board and has recommended consulted and recommended Solu-Medrol  20 mg Q8.  Diet advanced to full liquid diet.  Started on prednisone  taper per GI.  Likely home in a.m.     Medications     enoxaparin  (LOVENOX ) injection  40 mg Subcutaneous Q24H   methylPREDNISolone  (SOLU-MEDROL ) injection  20 mg Intravenous Q8H   pantoprazole   20 mg Oral Daily     Data Reviewed:   CBG:  No results for input(s): GLUCAP in the last 168 hours.  SpO2: 93 %    Vitals:   10/07/23 2221 10/08/23 0622 10/08/23 1233 10/08/23 2027  BP: (!) 88/60 (!) 88/54 95/66 105/70  Pulse: 74 86 83 77  Resp: 18 18 16 18   Temp:  98 F (36.7 C) 98.3 F (36.8 C) 97.8 F (36.6 C)  TempSrc: Oral Oral  Oral  SpO2: 98% 99% 100% 93%  Weight:      Height:          Data Reviewed:  Basic Metabolic Panel: Recent Labs  Lab 10/06/23 2200 10/08/23 0715  NA 138 137  K 4.0 3.8  CL 111 103   CO2 21* 24  GLUCOSE 96 131*  BUN 7 9  CREATININE 0.73 0.81  CALCIUM  7.6* 8.0*  MG  --  2.3    CBC: Recent Labs  Lab 10/06/23 2200 10/08/23 0715  WBC 8.8 6.5  NEUTROABS 7.2  --   HGB 12.7 12.8  HCT 39.1 39.7  MCV 89.1 91.3  PLT 374 422*    LFT Recent Labs  Lab 10/06/23 2200 10/08/23 0715  AST 13* 18  ALT 9 11  ALKPHOS 57 50  BILITOT 0.5 0.4  PROT 6.3* 6.9  ALBUMIN 2.7* 2.8*     Antibiotics: Anti-infectives (From admission, onward)    None        DVT prophylaxis: Lovenox   Code Status: Full code  Family Communication: Discussed with patient's husband at bedside   CONSULTS gastroenterology   Subjective   Denies nausea vomiting.  Had 1 loose stool yesterday.  Denies abdominal pain   Objective    Physical Examination:   General-appears in no acute distress Heart-S1-S2, regular, no murmur auscultated Lungs-clear to auscultation bilaterally, no wheezing or crackles auscultated Abdomen-soft, nontender, no organomegaly Extremities-no edema in the lower extremities Neuro-alert, oriented x3, no focal deficit noted   Status is: Inpatient:             Ozell Blunt   [facility] If 7PM-7AM,  please contact night-coverage at [email or web address], Office  [phone]   10/09/2023, 7:44 AM  LOS: 2 days

## 2023-10-09 NOTE — Plan of Care (Signed)
  Problem: Education: Goal: Knowledge of General Education information will improve Description: Including pain rating scale, medication(s)/side effects and non-pharmacologic comfort measures Outcome: Progressing   Problem: Health Behavior/Discharge Planning: Goal: Ability to manage health-related needs will improve Outcome: Progressing   Problem: Clinical Measurements: Goal: Ability to maintain clinical measurements within normal limits will improve Outcome: Progressing Goal: Diagnostic test results will improve Outcome: Progressing   Problem: Activity: Goal: Risk for activity intolerance will decrease Outcome: Progressing   Problem: Coping: Goal: Level of anxiety will decrease Outcome: Progressing   

## 2023-10-10 DIAGNOSIS — K50812 Crohn's disease of both small and large intestine with intestinal obstruction: Secondary | ICD-10-CM | POA: Diagnosis not present

## 2023-10-10 DIAGNOSIS — K50911 Crohn's disease, unspecified, with rectal bleeding: Secondary | ICD-10-CM | POA: Diagnosis not present

## 2023-10-10 MED ORDER — PANTOPRAZOLE SODIUM 20 MG PO TBEC: 20.0000 mg | DELAYED_RELEASE_TABLET | Freq: Every day | ORAL | 1 refills | Status: AC

## 2023-10-10 MED ORDER — PREDNISONE 10 MG PO TABS: ORAL_TABLET | ORAL | 0 refills | Status: DC

## 2023-10-10 NOTE — Progress Notes (Signed)
 Patient refused am vitals per NT ([facility]).

## 2023-10-10 NOTE — Discharge Summary (Signed)
 Physician Discharge Summary   Patient: Whitney Lowe MRN: [id number] DOB: 1985/06/01  Admit date:     10/06/2023  Discharge date: 10/10/23  Discharge Physician: Sabas GORMAN Brod   PCP: Celestia Harder, NP   Recommendations at discharge:   Follow-up gastroenterology outpatient  Discharge Diagnoses: Principal Problem:   Crohn's colitis (HCC)  Resolved Problems:   * No resolved hospital problems. *  Hospital Course:  38 y.o. female with medical history significant of Crohn's colitis, presented to hospital with abdominal pain and vomiting for 24 hours with decreased appetite..  Of note patient follows up with  [facility] gastroenterology.  She is currently on Skyrizi .  Patient then presented to the emergency department where she was found to be afebrile and hemodynamically stable.  Labs were obtained on presentation which showed WBC 8.8, hemoglobin 12.7, platelets 374, CMP unrevealing, lipase within normal limits.  Patient underwent CT abdomen pelvis which showed wall thickening of terminal ileum and dilated loops of bowel consistent with  ileus possible fibrostenotic disease.   Assessment and Plan:  Crohn's colitis [facility])   Patient presented with nausea, vomiting abdominal pain.  CT showed terminal ileitis.  ESR of 12.  Pregnancy test was negative.  Elevated CRP at 7.3.  Unable to obtain GI pathogen panel.  GI on board and has recommended  Solu-Medrol  20 mg Q8.  Diet advanced to full liquid diet.  Started on prednisone  taper per GI.   prednisone  40 mg daily for 1 week, 30 mg daily for 1 week 20 mg daily for 2 weeks, 10 mg daily for 2 weeks and then 5 mg daily for 2 weeks.   Follow-up Dr. Albertus as outpatient        Consultants:  Procedures performed:  Disposition: Home Diet recommendation:  Discharge Diet Orders (From admission, onward)     Start     Ordered   10/10/23 0000  Diet - low sodium heart healthy        10/10/23 1054           Regular diet DISCHARGE  MEDICATION: Allergies as of 10/10/2023       Reactions   Benadryl  [diphenhydramine ]    Pt reported new onset tightness at her throat shortly after receiving 25 mg Iv Benadryl . She also was noticeably less oriented than previous        Medication List     TAKE these medications    Combivent  Respimat 20-100 MCG/ACT Aers respimat Generic drug: Ipratropium-Albuterol  Inhale 1 puff into the lungs as needed.   pantoprazole  20 MG tablet Commonly known as: PROTONIX  Take 1 tablet (20 mg total) by mouth daily. Start taking on: October 11, 2023   predniSONE  10 MG tablet Commonly known as: DELTASONE  prednisone  40 mg daily for 1 week, Prednisone  30 mg daily for 1 week prednisone  20 mg daily for 2 weeks, prednisone  10 mg daily for 2 weeks and then prednisone   5 mg daily for 2 weeks, then STOP   PROBIOTIC PO Take 3 tablets by mouth daily.   psyllium 58.6 % powder Commonly known as: METAMUCIL Take 1 packet by mouth daily.   Skyrizi  600 MG/10ML injection Generic drug: risankizumab -rzaa Infuse over 1 hour week 0, week 4 and week 8 with maintenance 360 mg SQ at week 12 then every 8 weeks.        Discharge Exam: Filed Weights   10/06/23 2337  Weight: 45.4 kg   General-appears in no acute distress Heart-S1-S2, regular, no murmur auscultated Lungs-clear to auscultation bilaterally, no  wheezing or crackles auscultated Abdomen-soft, nontender, no organomegaly Extremities-no edema in the lower extremities Neuro-alert, oriented x3, no focal deficit noted  Condition at discharge: good  The results of significant diagnostics from this hospitalization (including imaging, microbiology, ancillary and laboratory) are listed below for reference.   Imaging Studies: DG Abd 2 Views Result Date: 10/09/2023 CLINICAL DATA:  Ileus EXAM: ABDOMEN - 2 VIEW COMPARISON:  abdominal x-ray 10/07/2023 FINDINGS: There are mildly dilated small bowel loops in the left abdomen measuring up to 3.2 cm similar to  prior. Colonic gas is present to the level the rectum. Calcifications in the pelvis are likely related to uterine fibroids, unchanged. No free air. IMPRESSION: Stable mildly dilated small bowel loops. Findings may represent ileus or partial small bowel obstruction. Electronically Signed   By: Greig Pique M.D.   On: 10/09/2023 17:55   DG Abd 2 Views Result Date: 10/08/2023 CLINICAL DATA:  Abdominal pain EXAM: ABDOMEN - 2 VIEW COMPARISON:  CT abdomen and pelvis October 06, 2023 FINDINGS: Few mildly dilated bowel loops with air-fluid levels in the left upper quadrant are still present and correlates with a dilated bowel loop seen on the prior CT may correlate with nonspecific ileus. IMPRESSION: Nonspecific ileus Electronically Signed   By: Franky Chard M.D.   On: 10/08/2023 07:24   CT ABDOMEN PELVIS W CONTRAST Result Date: 10/06/2023 EXAM: CT ABDOMEN AND PELVIS WITH CONTRAST 10/06/2023 11:49:20 PM TECHNIQUE: CT of the abdomen and pelvis was performed with the administration of 75 mL of intravenous iohexol  (OMNIPAQUE ) 300 mg/mL solution. Multiplanar reformatted images are provided for review. Automated exposure control, iterative reconstruction, and/or weight based adjustment of the mA/kV was utilized to reduce the radiation dose to as low as reasonably achievable. COMPARISON: MR enterography dated 06/18/2021. CLINICAL HISTORY: Abdominal pain, acute, nonlocalized. Upper abdominal pain, nausea, and vomiting for one day. FINDINGS: LOWER CHEST: No acute abnormality. LIVER: The liver is unremarkable. GALLBLADDER AND BILE DUCTS: Gallbladder is unremarkable. No biliary ductal dilatation. SPLEEN: No acute abnormality. PANCREAS: No acute abnormality. ADRENAL GLANDS: No acute abnormality. KIDNEYS, URETERS AND BLADDER: No stones in the kidneys or ureters. No hydronephrosis. No perinephric or periureteral stranding. Urinary bladder is unremarkable. GI AND BOWEL: Long segment wall thickening involving the distal/terminal  ileum (images 57 and 63) reflecting acute inflammatory Crohn's disease. Dilated, fluid filled small bowel in the central abdomen, favoring adynamic small bowel ileus in this clinical setting, although small bowel obstruction secondary to a terminal ileal stricture is technically possible. Colon is decompressed. PERITONEUM AND RETROPERITONEUM: No ascites. No free air. VASCULATURE: Aorta is normal in caliber. LYMPH NODES: No lymphadenopathy. REPRODUCTIVE ORGANS: Calcified uterine fibroids. BONES AND SOFT TISSUES: No acute osseous abnormality. No focal soft tissue abnormality. IMPRESSION: 1. Long segment wall thickening involving the distal/terminal ileum, consistent with acute inflammatory Crohn's disease. 2. Dilated, fluid-filled small bowel in the central abdomen, favoring adynamic small bowel ileus. Small bowel obstruction secondary to fibrostenotic disease/stricture is possible but not favored. 3. No pneumatosis or free air. Electronically signed by: Pinkie Pebbles MD 10/06/2023 11:58 PM EDT RP Workstation: [id number]    Microbiology: Results for orders placed or performed in visit on 03/05/22  CALPROTECTIN     Status: Abnormal   Collection Time: 03/05/22  2:30 PM   Specimen: Stool  Result Value Ref Range Status   Calprotectin 6,970 (H) mcg/g Final    Comment:  Reference Range:                                       <50     Normal                                       50-120  Borderline                                       >120    Elevated . Calprotectin in Crohn's disease and ulcerative colitis can be five to several thousand times above the reference population (50 mcg/g or less). Levels are usually 50 mcg/g or less in healthy patients and with irritable bowel syndrome. Repeat testing in 4-6 weeks is suggested for borderline values.     Labs: CBC: Recent Labs  Lab 10/06/23 2200 10/08/23 0715 10/09/23 1008  WBC 8.8 6.5 7.0  NEUTROABS 7.2  --    --   HGB 12.7 12.8 13.8  HCT 39.1 39.7 44.1  MCV 89.1 91.3 93.0  PLT 374 422* 471*   Basic Metabolic Panel: Recent Labs  Lab 10/06/23 2200 10/08/23 0715 10/09/23 1008  NA 138 137 137  K 4.0 3.8 3.9  CL 111 103 103  CO2 21* 24 23  GLUCOSE 96 131* 99  BUN 7 9 12   CREATININE 0.73 0.81 0.79  CALCIUM  7.6* 8.0* 8.5*  MG  --  2.3  --    Liver Function Tests: Recent Labs  Lab 10/06/23 2200 10/08/23 0715  AST 13* 18  ALT 9 11  ALKPHOS 57 50  BILITOT 0.5 0.4  PROT 6.3* 6.9  ALBUMIN 2.7* 2.8*   CBG: No results for input(s): GLUCAP in the last 168 hours.  Discharge time spent: greater than 30 minutes.  Signed: Sabas GORMAN Brod, MD [facility] 10/10/2023

## 2023-10-10 NOTE — Progress Notes (Signed)
 Patient refused am vitals per NT Rick).

## 2023-10-10 NOTE — Progress Notes (Addendum)
   Patient Name: Whitney Lowe Date of Encounter: 10/10/2023, 9:48 AM   Patient Profile:  Whitney Lowe 38 y.o. female with a history of depression, bipolar disorder, ADHD, uterine fibroids, C. difficile 2019, erythema nodosum and Crohn's ileocolitis initially diagnosed 06/2016. Intolerant to immunomodulators. 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 She was on Stelara  for about one year which was ineffective then switched to Entyvio  March 2022 without clinical remission and was subsequently started on Skyrizi  June 2024 which she self discontinued 05/2023 because she though it caused significant constipation. Admitted 10/06/2023 with N/V and upper abdominal pain.      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  Prednisone  taper as follows:  prednisone  40 mg daily for 1 week, 30 mg daily for 1 week 20 mg daily for 2 weeks, 10 mg daily for 2 weeks and then 5 mg daily for 2 weeks. Will prescribe 10 mg tabs so she can break them for the 5 mg part of the taper.   Soft or low residue diet.  Reviewed need to avoid raw vegetables/roughage. Stop Metamucil at this point  We are working on arranging follow-up in the office with Dr. Albertus or one of his advanced practice providers.  Patient will be contacted after discharge.   Subjective  Tolerating soft foods.  Did not like the taste but was able to eat pancakes and other parts of breakfast.  Passing gas.  No stool.  No pain medicine needed much less abdominal pain.   Objective  BP 100/63 (BP Location: Right Arm)   Pulse 65    Temp 98.3 F (36.8 C) (Oral)   Resp 17   Ht 4' 10 (1.473 m)   Wt 45.4 kg   SpO2 99%   BMI 20.90 kg/m  Abdomen is slightly protuberant soft nontender bowel sounds present not increased  Recent Labs  Lab 10/06/23 2200 10/08/23 0715 10/09/23 1008  HGB 12.7 12.8 13.8  HCT 39.1 39.7 44.1  WBC 8.8 6.5 7.0  PLT 374 422* 471*   Recent Labs  Lab 10/06/23 2200 10/08/23 0715 10/09/23 1008  NA 138 137 137  K 4.0 3.8 3.9  CL 111 103 103  CO2 21* 24 23  GLUCOSE 96 131* 99  BUN 7 9 12   CREATININE 0.73 0.81 0.79  CALCIUM  7.6* 8.0* 8.5*  MG  --  2.3  --         Lupita Whitney Commander, MD, [facility] See Whitney Lowe on call - gastroenterology for best contact person 10/10/2023 9:48 AM

## 2023-10-12 ENCOUNTER — Encounter: Payer: Self-pay | Admitting: Nurse Practitioner

## 2023-10-12 NOTE — Telephone Encounter (Signed)
 Patient requesting f/u call to discuss when is a good time for her to return to work? Please advise.   Thank you

## 2023-10-12 NOTE — Telephone Encounter (Signed)
 Whitney Lowe-  Patient recently hospitalized with Crohns' flare. She is requesting to know when she can/should return to work. Please advise of your recommendations...  Dr Albertus is her primary GI doctor (out of office until 10/19/23).

## 2023-10-12 NOTE — Telephone Encounter (Signed)
 Dottie, can you send her a work excuse letter for her to be off work for this week, return to work on Monday 6/30. She works for the [employer]. Patient continues to have abdominal cramping pain and is consuming mostly clear liquids today. She will contact our office if her symptoms persist, if no improvement and back to the hospital if she has severe abdominal pain or vomiting.   If her symptoms improve and she is tolerating solid foods, she might be able to return to work later this week, we'll see.   She will remain on Prednisone  taper as prescribed at time of discharged. She will push fluids and will slowly advance to a soft diet (she dislikes a full diet which mostly consists of dairy products which Crohn's related abd pain).

## 2023-10-12 NOTE — Telephone Encounter (Signed)
 Left message for patient to call back

## 2023-10-13 ENCOUNTER — Telehealth: Payer: Self-pay

## 2023-10-13 NOTE — Telephone Encounter (Signed)
 Spoke w/pt to verify if she was able to get out of work note. Pt verified she was able to get one from [facility]

## 2023-10-13 NOTE — Telephone Encounter (Signed)
 Spoke w/ pt confirmed out of work note has been received.

## 2023-10-13 NOTE — Telephone Encounter (Signed)
 Left message for patient to call back

## 2023-10-14 NOTE — Telephone Encounter (Signed)
 Letter created and placed in Mychart. Patient has not returned call x 2. Will await additional correspondence from patient before any repeated attempts to reach her.

## 2023-10-15 NOTE — Telephone Encounter (Signed)
 Patient states that she was able to find the letter we created in [facility] and has given this to her employer who has accepted the letter. She also states that she brought an FMLA and short term disability form to be filled out last week.   Will ask Corean Gravely, front desk receptionist about this.

## 2023-10-15 NOTE — Telephone Encounter (Signed)
 Patient is returning  a call back to [place]. Patient is requesting a call back from [place]. Please advise.

## 2023-10-16 NOTE — Telephone Encounter (Signed)
 Per Whitney Lowe, FMLA paperwork and STD papers have been placed on Dr Pyrtle's desk for review.

## 2023-10-17 ENCOUNTER — Telehealth: Admitting: Family Medicine

## 2023-10-17 DIAGNOSIS — K5909 Other constipation: Secondary | ICD-10-CM | POA: Diagnosis not present

## 2023-10-17 MED ORDER — SENNA-DOCUSATE SODIUM 8.6-50 MG PO TABS: 2.0000 | ORAL_TABLET | Freq: Every day | ORAL | 0 refills | Status: AC

## 2023-10-17 NOTE — Progress Notes (Signed)
 Virtual Visit Consent   Whitney Lowe, you are scheduled for a virtual visit with a [facility] provider today. Just as with appointments in the office, your consent must be obtained to participate. Your consent will be active for this visit and any virtual visit you may have with one of our providers in the next 365 days. If you have a MyChart account, a copy of this consent can be sent to you electronically.  As this is a virtual visit, video technology does not allow for your provider to perform a traditional examination. This may limit your provider's ability to fully assess your condition. If your provider identifies any concerns that need to be evaluated in person or the need to arrange testing (such as labs, EKG, etc.), we will make arrangements to do so. Although advances in technology are sophisticated, we cannot ensure that it will always work on either your end or our end. If the connection with a video visit is poor, the visit may have to be switched to a telephone visit. With either a video or telephone visit, we are not always able to ensure that we have a secure connection.  By engaging in this virtual visit, you consent to the provision of healthcare and authorize for your insurance to be billed (if applicable) for the services provided during this visit. Depending on your insurance coverage, you may receive a charge related to this service.  I need to obtain your verbal consent now. Are you willing to proceed with your visit today? Whitney Lowe has provided verbal consent on 10/17/2023 for a virtual visit (video or telephone). [name], [place]  Date: 10/17/2023 11:29 AM   Virtual Visit via Video Note   I, [name], connected with  Whitney Lowe  ([id number], 1985/11/13) on 10/17/23 at 11:00 AM EDT by a video-enabled telemedicine application and verified that I am speaking with the correct person using two identifiers.  Location: Patient: Virtual Visit Location Patient:  Home Provider: Virtual Visit Location Provider: Home Office   I discussed the limitations of evaluation and management by telemedicine and the availability of in person appointments. The patient expressed understanding and agreed to proceed.    History of Present Illness: Whitney Lowe is a 38 y.o. who identifies as a female who was assigned female at birth, and is being seen today for c/o recently being discharge from the hospital last Saturday and is having issues with constipation.  Pt states she has a history of Crohn's disease.  Pt states she was hoping she can have something prescribed for constipation.  Pt states she has taken Metamucil, plums, water and walking the track. Pt states last bowel movement was Tuesday. Pt is concerned about having to go back to the hospital for the constipation. Pt states her abdomen is soft.   HPI: HPI  Problems:  Patient Active Problem List   Diagnosis Date Noted   Crohn's colitis (HCC) 10/07/2023   IDA (iron  deficiency anemia) 04/22/2021   Placental abruption 01/01/2019   Tachycardia 12/01/2018   Preterm premature rupture of membranes (PPROM), second trimester, antepartum 11/25/2018   Antepartum placental abruption 10/05/2018   Cervical mass 10/04/2018   Fibroid uterus 10/04/2018   Anemia affecting pregnancy, antepartum 09/06/2018   Rubella non-immune status, antepartum 09/03/2018   Rh negative, antepartum 09/03/2018   Supervision of high risk pregnancy, antepartum 08/17/2018   Long-term use of immunosuppressant medication-Stelara  07/21/2018   Crohn's ileocolitis (HCC) 06/19/2016    Allergies:  Allergies  Allergen Reactions  Benadryl  [Diphenhydramine ]     Pt reported new onset tightness at her throat shortly after receiving 25 mg Iv Benadryl . She also was noticeably less oriented than previous   Medications:  Current Outpatient Medications:    sennosides-docusate sodium  (SENOKOT-S) 8.6-50 MG tablet, Take 2 tablets by mouth daily for 5 days.,  Disp: 10 tablet, Rfl: 0   Ipratropium-Albuterol  (COMBIVENT  RESPIMAT) 20-100 MCG/ACT AERS respimat, Inhale 1 puff into the lungs as needed. (Patient not taking: Reported on 10/07/2023), Disp: , Rfl:    pantoprazole  (PROTONIX ) 20 MG tablet, Take 1 tablet (20 mg total) by mouth daily., Disp: 30 tablet, Rfl: 1   predniSONE  (DELTASONE ) 10 MG tablet, prednisone  40 mg daily for 1 week, Prednisone  30 mg daily for 1 week prednisone  20 mg daily for 2 weeks, prednisone  10 mg daily for 2 weeks and then prednisone   5 mg daily for 2 weeks, then STOP, Disp: 120 tablet, Rfl: 0   Probiotic Product (PROBIOTIC PO), Take 3 tablets by mouth daily., Disp: , Rfl:    psyllium (METAMUCIL) 58.6 % powder, Take 1 packet by mouth daily., Disp: , Rfl:    risankizumab -rzaa (SKYRIZI ) 600 MG/10ML injection, Infuse over 1 hour week 0, week 4 and week 8 with maintenance 360 mg SQ at week 12 then every 8 weeks. (Patient not taking: Reported on 10/07/2023), Disp: 30 mL, Rfl: 0  Observations/Objective: Patient is well-developed, well-nourished in no acute distress.  Resting comfortably at home.  Head is normocephalic, atraumatic.  No labored breathing.  Speech is clear and coherent with logical content.  Patient is alert and oriented at baseline.    Assessment and Plan: 1. Other constipation (Primary) - sennosides-docusate sodium  (SENOKOT-S) 8.6-50 MG tablet; Take 2 tablets by mouth daily for 5 days.  Dispense: 10 tablet; Refill: 0  -Start Senokot -Pt to follow up in person urgent care or hospital if worsening symptoms.   Follow Up Instructions: I discussed the assessment and treatment plan with the patient. The patient was provided an opportunity to ask questions and all were answered. The patient agreed with the plan and demonstrated an understanding of the instructions.  A copy of instructions were sent to the patient via MyChart unless otherwise noted below.    The patient was advised to call back or seek an in-person  evaluation if the symptoms worsen or if the condition fails to improve as anticipated.    [name], PA-C

## 2023-10-17 NOTE — Patient Instructions (Signed)
 [name], thank you for joining [name], PA-C for today's virtual visit.  While this provider is not your primary care provider (PCP), if your PCP is located in our provider database this encounter information will be shared with them immediately following your visit.   A [facility] MyChart account gives you access to today's visit and all your visits, tests, and labs performed at [facility]  click here if you don't have a [facility] MyChart account or go to [email or web address]  Consent: (Patient) Whitney Lowe provided verbal consent for this virtual visit at the beginning of the encounter.  Current Medications:  Current Outpatient Medications:    sennosides-docusate sodium  (SENOKOT-S) 8.6-50 MG tablet, Take 2 tablets by mouth daily for 5 days., Disp: 10 tablet, Rfl: 0   Ipratropium-Albuterol  (COMBIVENT  RESPIMAT) 20-100 MCG/ACT AERS respimat, Inhale 1 puff into the lungs as needed. (Patient not taking: Reported on 10/07/2023), Disp: , Rfl:    pantoprazole  (PROTONIX ) 20 MG tablet, Take 1 tablet (20 mg total) by mouth daily., Disp: 30 tablet, Rfl: 1   predniSONE  (DELTASONE ) 10 MG tablet, prednisone  40 mg daily for 1 week, Prednisone  30 mg daily for 1 week prednisone  20 mg daily for 2 weeks, prednisone  10 mg daily for 2 weeks and then prednisone   5 mg daily for 2 weeks, then STOP, Disp: 120 tablet, Rfl: 0   Probiotic Product (PROBIOTIC PO), Take 3 tablets by mouth daily., Disp: , Rfl:    psyllium (METAMUCIL) 58.6 % powder, Take 1 packet by mouth daily., Disp: , Rfl:    risankizumab -rzaa (SKYRIZI ) 600 MG/10ML injection, Infuse over 1 hour week 0, week 4 and week 8 with maintenance 360 mg SQ at week 12 then every 8 weeks. (Patient not taking: Reported on 10/07/2023), Disp: 30 mL, Rfl: 0   Medications ordered in this encounter:  Meds ordered this encounter  Medications   sennosides-docusate sodium  (SENOKOT-S) 8.6-50 MG tablet    Sig: Take 2 tablets by mouth daily for  5 days.    Dispense:  10 tablet    Refill:  0     *If you need refills on other medications prior to your next appointment, please contact your pharmacy*  Follow-Up: Call back or seek an in-person evaluation if the symptoms worsen or if the condition fails to improve as anticipated.  [facility] [phone]  Other Instructions Constipation, Adult Constipation is when a person has trouble pooping (having a bowel movement). When you have this condition, you may poop fewer than 3 times a week. Your poop (stool) may also be dry, hard, or bigger than normal. Follow these instructions at home: Eating and drinking  Eat foods that have a lot of fiber, such as: Fresh fruits and vegetables. Whole grains. Beans. Eat less of foods that are low in fiber and high in fat and sugar, such as: [place] fries. Hamburgers. Cookies. Candy. Soda. Drink enough fluid to keep your pee (urine) pale yellow. General instructions Exercise regularly or as told by your doctor. Try to do 150 minutes of exercise each week. Go to the restroom when you feel like you need to poop. Do not hold it in. Take over-the-counter and prescription medicines only as told by your doctor. These include any fiber supplements. When you poop: Do deep breathing while relaxing your lower belly (abdomen). Relax your pelvic floor. The pelvic floor is a group of muscles that support the rectum, bladder, and intestines (as well as the uterus in women). Watch your condition  for any changes. Tell your doctor if you notice any. Keep all follow-up visits as told by your doctor. This is important. Contact a doctor if: You have pain that gets worse. You have a fever. You have not pooped for 4 days. You vomit. You are not hungry. You lose weight. You are bleeding from the opening of the butt (anus). You have thin, pencil-like poop. Get help right away if: You have a fever, and your symptoms suddenly get worse. You  leak poop or have blood in your poop. Your belly feels hard or bigger than normal (bloated). You have very bad belly pain. You feel dizzy or you faint. Summary Constipation is when a person poops fewer than 3 times a week, has trouble pooping, or has poop that is dry, hard, or bigger than normal. Eat foods that have a lot of fiber. Drink enough fluid to keep your pee (urine) pale yellow. Take over-the-counter and prescription medicines only as told by your doctor. These include any fiber supplements. This information is not intended to replace advice given to you by your health care provider. Make sure you discuss any questions you have with your health care provider. Document Revised: 02/19/2022 Document Reviewed: 02/19/2022 Elsevier Patient Education  2024 Elsevier Inc.   If you have been instructed to have an in-person evaluation today at a local Urgent Care facility, please use the link below. It will take you to a list of all of our available [facility], including address, phone number and hours of operation. Please do not delay care.  [facility]  If you or a family member do not have a primary care provider, use the link below to schedule a visit and establish care. When you choose a [facility] primary care physician or advanced practice provider, you gain a long-term partner in health. Find a Primary Care Provider  Learn more about [facility] in-office and virtual care options: [facility] - Get Care Now

## 2023-10-22 ENCOUNTER — Encounter: Payer: Self-pay | Admitting: *Deleted

## 2023-10-22 NOTE — Telephone Encounter (Signed)
 Patient calling in regards to [facility] paper work. Please advise.   Thank you

## 2023-10-22 NOTE — Telephone Encounter (Signed)
 Spoke to patient and advised FMLA paperwork was filled out and faxed on 10/20/23 (available in media tab through epic). Patient verbalizes understanding.  She also states that she was told at the hospital not to take metamucil any longer but since has had issues with constipation and some blood in her stool. States she was told she could use senokot and this seems to help for the most part. She did just want to make us  aware though that there was blood.

## 2023-10-22 NOTE — Telephone Encounter (Signed)
 Alan-  Did you say FMLA papers were already faxed on this patient? If not, let me know so I can track it down.  Thank you!

## 2023-10-26 NOTE — Telephone Encounter (Signed)
 Inbound call from patient, states her employer has not received a fax, patient states she sent a MyChart message with the fax number information to clarify. Patient would like it resent. And MyChart message sent to confirm.

## 2023-10-26 NOTE — Telephone Encounter (Signed)
 See mychart message dated 10/22/23.

## 2023-10-28 ENCOUNTER — Telehealth: Payer: Self-pay

## 2023-10-28 NOTE — Telephone Encounter (Signed)
 Per hospital notes pt stopped the med on her own in Feb, stated she thought it caused constipation. Pt is scheduled to be seen in August to discuss if she will continue the skyrizi  or be switched to a different med.

## 2023-10-28 NOTE — Telephone Encounter (Signed)
 Pharmacy Patient Advocate Encounter   Received notification from CoverMyMeds that prior authorization for Skyrizi  360MG /2.4ML (150MG /ML) single-dose prefilled cartridge with on-body injector is required/requested.   Insurance verification completed.   The patient is insured through [facility] .   Per test claim: xxx

## 2023-12-07 NOTE — Progress Notes (Deleted)
 Ellouise Console, PA-C [address] [place], [place]  [address] Phone: [phone]   Primary Care Physician: Celestia Harder, NP  Primary Gastroenterologist:  Ellouise Console, PA-C / Dr. Gordy Starch   Chief Complaint: Hospital follow-up Crohn's flare       HPI:   Whitney Lowe is a 38 y.o. female presents for hospital follow-up for Crohn's flare.  She was hospitalized at [facility] 10/06/2023 until 10/10/2023 for Crohn's colitis flare.  Presented with abdominal pain and vomiting.  Was on Skyrizi .  Initial labs showed WBC 8.8, hemoglobin 12.7, platelet 374.  Normal CMP and lipase.  Elevated CRP 7.3.  CT abdomen pelvis showed wall thickening of the terminal ileum and dilated loops of bowel consistent with ileus versus possible fibrostenotic disease.  Was started on prednisone  40 Mg daily for 1 week, then 30 Mg daily for 1 week, then 20 Mg daily x 2 weeks, then 10 Mg daily x 2 weeks, then 5 Mg daily for 2 weeks.  Skyrizi  600 MG/10ML injection Generic drug: risankizumab -rzaa Infuse over 1 hour week 0, week 4 and week 8 with maintenance 360 mg SQ at week 12 then every 8 weeks.  History of ileocolonic Crohn's disease diagnosed in March 2018, history of erythema nodosum, prior C. difficile, uterine fibroids, ADHD, bipolar depression who is here for follow-up.  She is a patient of Dr. Pamula.   Failed Entyvio .  She started Skyrizi  in July.  Completed the induction and now uses the body injector 360 mg every 8 weeks.  She is here today for regular follow-up.  She says that she feels great.  She has not had any diarrhea, in fact she has had some issues with constipation recently.  Has started taking some fiber capsules and drinking more water and that has helped with constipation.  No bleeding except for on occasion when she was constipated it was a very minimal bright red blood.  No abdominal pain.  Her appetite is good and she has been eating well.  Her weight is up 7 pounds compared to September.    She saw Dr. Merrilyn through [facility] earlier this year and had an EUA with drainage of abscess, Botox injection for anal fissure, anal fissurectomy and sigmoidoscopy in February.  Followed up with her in April and was doing well.  Was told to contact them for any recurrent anal symptoms as they could repeat Botox if necessary.  Current Outpatient Medications  Medication Sig Dispense Refill   Ipratropium-Albuterol  (COMBIVENT  RESPIMAT) 20-100 MCG/ACT AERS respimat Inhale 1 puff into the lungs as needed. (Patient not taking: Reported on 10/07/2023)     pantoprazole  (PROTONIX ) 20 MG tablet Take 1 tablet (20 mg total) by mouth daily. 30 tablet 1   predniSONE  (DELTASONE ) 10 MG tablet prednisone  40 mg daily for 1 week, Prednisone  30 mg daily for 1 week prednisone  20 mg daily for 2 weeks, prednisone  10 mg daily for 2 weeks and then prednisone   5 mg daily for 2 weeks, then STOP 120 tablet 0   Probiotic Product (PROBIOTIC PO) Take 3 tablets by mouth daily.     psyllium (METAMUCIL) 58.6 % powder Take 1 packet by mouth daily.     risankizumab -rzaa (SKYRIZI ) 600 MG/10ML injection Infuse over 1 hour week 0, week 4 and week 8 with maintenance 360 mg SQ at week 12 then every 8 weeks. (Patient not taking: Reported on 10/07/2023) 30 mL 0   No current facility-administered medications for this visit.  Allergies as of 12/08/2023 - Review Complete 10/17/2023  Allergen Reaction Noted   Benadryl  [diphenhydramine ]  01/07/2019    Past Medical History:  Diagnosis Date   ADHD    stopped abilify with preg   ADHD (attention deficit hyperactivity disorder)    Anemia    Anxiety    not currently on meds   Bipolar    Cataract    Rt eye   Complication of anesthesia    became combative    Crohn's ileocolitis (HCC) 06/19/2016   Eczema    Fibroids    uterine   Infection    UTI   Ovarian cyst    Periumbilical hernia     Past Surgical History:  Procedure Laterality Date   CESAREAN SECTION N/A 01/01/2019    Procedure: CESAREAN SECTION;  Surgeon: Nicholaus Burnard HERO, MD;  Location: [facility] LD ORS;  Service: Obstetrics;  Laterality: N/A;   COLONOSCOPY     ESOPHAGOGASTRODUODENOSCOPY     removal of cyst in ear     6th grade    Review of Systems:    All systems reviewed and negative except where noted in HPI.    Physical Exam:  There were no vitals taken for this visit. No LMP recorded.  General: Well-nourished, well-developed in no acute distress.  Lungs: Clear to auscultation bilaterally. Non-labored. Heart: Regular rate and rhythm, no murmurs rubs or gallops.  Abdomen: Bowel sounds are normal; Abdomen is Soft; No hepatosplenomegaly, masses or hernias;  No Abdominal Tenderness; No guarding or rebound tenderness. Neuro: Alert and oriented x 3.  Grossly intact.  Psych: Alert and cooperative, normal mood and affect.   Imaging Studies: No results found.  Labs: CBC    Component Value Date/Time   WBC 7.0 10/09/2023 1008   RBC 4.74 10/09/2023 1008   HGB 13.8 10/09/2023 1008   HGB 9.8 (L) 09/02/2018 1651   HCT 44.1 10/09/2023 1008   HCT 28.5 (L) 09/02/2018 1651   PLT 471 (H) 10/09/2023 1008   PLT 492 (H) 09/02/2018 1651   MCV 93.0 10/09/2023 1008   MCV 84 09/02/2018 1651   MCH 29.1 10/09/2023 1008   MCHC 31.3 10/09/2023 1008   RDW 13.8 10/09/2023 1008   RDW 14.7 09/02/2018 1651   LYMPHSABS 1.0 10/06/2023 2200   LYMPHSABS 2.9 09/02/2018 1651   MONOABS 0.4 10/06/2023 2200   EOSABS 0.1 10/06/2023 2200   EOSABS 0.4 09/02/2018 1651   BASOSABS 0.0 10/06/2023 2200   BASOSABS 0.0 09/02/2018 1651    CMP     Component Value Date/Time   NA 137 10/09/2023 1008   K 3.9 10/09/2023 1008   CL 103 10/09/2023 1008   CO2 23 10/09/2023 1008   GLUCOSE 99 10/09/2023 1008   BUN 12 10/09/2023 1008   CREATININE 0.79 10/09/2023 1008   CALCIUM  8.5 (L) 10/09/2023 1008   PROT 6.9 10/08/2023 0715   ALBUMIN 2.8 (L) 10/08/2023 0715   AST 18 10/08/2023 0715   ALT 11 10/08/2023 0715   ALKPHOS 50 10/08/2023  0715   BILITOT 0.4 10/08/2023 0715   GFRNONAA >60 10/09/2023 1008   GFRAA >60 01/07/2019 1417       Assessment and Plan:   Whitney Lowe is a 38 y.o. y/o female ***    Ellouise Console, PA-C  Follow up ***

## 2023-12-08 ENCOUNTER — Ambulatory Visit: Admitting: Physician Assistant

## 2024-03-26 ENCOUNTER — Telehealth: Admitting: Nurse Practitioner

## 2024-03-26 ENCOUNTER — Encounter

## 2024-03-26 DIAGNOSIS — R519 Headache, unspecified: Secondary | ICD-10-CM

## 2024-03-26 MED ORDER — ONDANSETRON HCL 4 MG PO TABS: 4.0000 mg | ORAL_TABLET | Freq: Three times a day (TID) | ORAL | 0 refills | Status: AC | PRN

## 2024-03-26 MED ORDER — SUMATRIPTAN SUCCINATE 25 MG PO TABS: 25.0000 mg | ORAL_TABLET | Freq: Every day | ORAL | 0 refills | Status: AC

## 2024-03-26 NOTE — Progress Notes (Signed)
 Logged in and then logged off but did not return for visit

## 2024-03-26 NOTE — Progress Notes (Signed)
 Virtual Visit Consent   Whitney Lowe, you are scheduled for a virtual visit with a [facility] provider today. Just as with appointments in the office, your consent must be obtained to participate. Your consent will be active for this visit and any virtual visit you may have with one of our providers in the next 365 days. If you have a MyChart account, a copy of this consent can be sent to you electronically.  As this is a virtual visit, video technology does not allow for your provider to perform a traditional examination. This may limit your provider's ability to fully assess your condition. If your provider identifies any concerns that need to be evaluated in person or the need to arrange testing (such as labs, EKG, etc.), we will make arrangements to do so. Although advances in technology are sophisticated, we cannot ensure that it will always work on either your end or our end. If the connection with a video visit is poor, the visit may have to be switched to a telephone visit. With either a video or telephone visit, we are not always able to ensure that we have a secure connection.  By engaging in this virtual visit, you consent to the provision of healthcare and authorize for your insurance to be billed (if applicable) for the services provided during this visit. Depending on your insurance coverage, you may receive a charge related to this service.  I need to obtain your verbal consent now. Are you willing to proceed with your visit today? Antoniette Rietz has provided verbal consent on 03/26/2024 for a virtual visit (video or telephone). Haze LELON Servant, NP  Date: 03/26/2024 7:50 PM   Virtual Visit via Video Note   I, Haze LELON Servant, connected with  Jenell Dobransky  ([id number], November 07, 1985) on 03/26/24 at  7:45 PM EST by a video-enabled telemedicine application and verified that I am speaking with the correct person using two identifiers.  Location: Patient: Virtual Visit Location Patient:  Home Provider: Virtual Visit Location Provider: Home Office   I discussed the limitations of evaluation and management by telemedicine and the availability of in person appointments. The patient expressed understanding and agreed to proceed.    History of Present Illness: Whitney Lowe is a 38 y.o. who identifies as a female who was assigned female at birth, and is being seen today for headache.  MS Ranking has been experiencing right sided headache with associated nausea. Ongoing for over a week now. She has taken tylenol  for headache pain with little relief. She is not experiencing any neurological symptoms such as weakness, hemiparesis, vision changes, changes in speech   Problems:  Patient Active Problem List   Diagnosis Date Noted   Crohn's colitis (HCC) 10/07/2023   IDA (iron  deficiency anemia) 04/22/2021   Placental abruption 01/01/2019   Tachycardia 12/01/2018   Preterm premature rupture of membranes (PPROM), second trimester, antepartum 11/25/2018   Antepartum placental abruption 10/05/2018   Cervical mass 10/04/2018   Fibroid uterus 10/04/2018   Anemia affecting pregnancy, antepartum 09/06/2018   Rubella non-immune status, antepartum 09/03/2018   Rh negative, antepartum 09/03/2018   Supervision of high risk pregnancy, antepartum 08/17/2018   Long-term use of immunosuppressant medication-Stelara  07/21/2018   Crohn's ileocolitis (HCC) 06/19/2016    Allergies:  Allergies  Allergen Reactions   Benadryl  [Diphenhydramine ]     Pt reported new onset tightness at her throat shortly after receiving 25 mg Iv Benadryl . She also was noticeably less oriented than previous   Medications:  Current Outpatient Medications:    ondansetron  (ZOFRAN ) 4 MG tablet, Take 1 tablet (4 mg total) by mouth every 8 (eight) hours as needed for nausea or vomiting., Disp: 20 tablet, Rfl: 0   SUMAtriptan  (IMITREX ) 25 MG tablet, Take 1 tablet (25 mg total) by mouth daily. May repeat in 2 hours if headache  persists or recurs. Should not exceed more than 2 tablets in 24 hours., Disp: 10 tablet, Rfl: 0   Ipratropium-Albuterol  (COMBIVENT  RESPIMAT) 20-100 MCG/ACT AERS respimat, Inhale 1 puff into the lungs as needed. (Patient not taking: Reported on 10/07/2023), Disp: , Rfl:    pantoprazole  (PROTONIX ) 20 MG tablet, Take 1 tablet (20 mg total) by mouth daily., Disp: 30 tablet, Rfl: 1   Probiotic Product (PROBIOTIC PO), Take 3 tablets by mouth daily., Disp: , Rfl:    psyllium (METAMUCIL) 58.6 % powder, Take 1 packet by mouth daily., Disp: , Rfl:    risankizumab -rzaa (SKYRIZI ) 600 MG/10ML injection, Infuse over 1 hour week 0, week 4 and week 8 with maintenance 360 mg SQ at week 12 then every 8 weeks. (Patient not taking: Reported on 10/07/2023), Disp: 30 mL, Rfl: 0  Observations/Objective: Patient is well-developed, well-nourished in no acute distress.  Resting comfortably at home.  Head is normocephalic, atraumatic.  No labored breathing.  Speech is clear and coherent with logical content.  Patient is alert and oriented at baseline.    Assessment and Plan: 1. Frequent headaches (Primary) - SUMAtriptan  (IMITREX ) 25 MG tablet; Take 1 tablet (25 mg total) by mouth daily. May repeat in 2 hours if headache persists or recurs. Should not exceed more than 2 tablets in 24 hours.  Dispense: 10 tablet; Refill: 0 - ondansetron  (ZOFRAN ) 4 MG tablet; Take 1 tablet (4 mg total) by mouth every 8 (eight) hours as needed for nausea or vomiting.  Dispense: 20 tablet; Refill: 0    Follow Up Instructions: I discussed the assessment and treatment plan with the patient. The patient was provided an opportunity to ask questions and all were answered. The patient agreed with the plan and demonstrated an understanding of the instructions.  A copy of instructions were sent to the patient via MyChart unless otherwise noted below.    The patient was advised to call back or seek an in-person evaluation if the symptoms worsen or  if the condition fails to improve as anticipated.    Channing Yeager W Lenwood Balsam, NP
